# Patient Record
Sex: Female | Born: 1935 | Race: White | Hispanic: No | Smoking: Former smoker
Health system: Southern US, Community
[De-identification: ages and names within clinical notes are randomized; demographics above are authoritative.]

## PROBLEM LIST (undated history)

## (undated) DIAGNOSIS — E7800 Pure hypercholesterolemia, unspecified: Secondary | ICD-10-CM

## (undated) DIAGNOSIS — H9190 Unspecified hearing loss, unspecified ear: Secondary | ICD-10-CM

## (undated) DIAGNOSIS — K5790 Diverticulosis of intestine, part unspecified, without perforation or abscess without bleeding: Secondary | ICD-10-CM

## (undated) DIAGNOSIS — I10 Essential (primary) hypertension: Secondary | ICD-10-CM

## (undated) DIAGNOSIS — K219 Gastro-esophageal reflux disease without esophagitis: Secondary | ICD-10-CM

## (undated) DIAGNOSIS — J302 Other seasonal allergic rhinitis: Secondary | ICD-10-CM

## (undated) DIAGNOSIS — E119 Type 2 diabetes mellitus without complications: Secondary | ICD-10-CM

## (undated) DIAGNOSIS — M1990 Unspecified osteoarthritis, unspecified site: Secondary | ICD-10-CM

## (undated) DIAGNOSIS — C801 Malignant (primary) neoplasm, unspecified: Secondary | ICD-10-CM

## (undated) DIAGNOSIS — R7303 Prediabetes: Secondary | ICD-10-CM

## (undated) DIAGNOSIS — M810 Age-related osteoporosis without current pathological fracture: Secondary | ICD-10-CM

## (undated) DIAGNOSIS — K635 Polyp of colon: Secondary | ICD-10-CM

## (undated) HISTORY — PX: DILATION AND CURETTAGE OF UTERUS: SHX78

---

## 1990-10-08 HISTORY — PX: BREAST CYST ASPIRATION: SHX578

## 1998-10-08 HISTORY — PX: BREAST EXCISIONAL BIOPSY: SUR124

## 2005-03-12 ENCOUNTER — Ambulatory Visit: Payer: Self-pay | Admitting: Internal Medicine

## 2005-08-07 ENCOUNTER — Ambulatory Visit: Payer: Self-pay | Admitting: Unknown Physician Specialty

## 2005-10-28 ENCOUNTER — Other Ambulatory Visit: Payer: Self-pay

## 2005-10-28 ENCOUNTER — Emergency Department: Payer: Self-pay | Admitting: Emergency Medicine

## 2005-10-28 IMAGING — CR DG CHEST 1V PORT
1 series · 1 of 1 positions shown · non-contrast
Comparison: none

REASON FOR EXAM: Chest pain
COMMENTS:

PROCEDURE:     DXR - DXR PORTABLE CHEST SINGLE VIEW  - [DATE]  [DATE]
RESULT:          The lungs are hyperinflated consistent with COPD.  The
heart is borderline enlarged.  There is no infiltrate or effusion.  There
appears to be some apical fibrosis, worse on the RIGHT.

[view not recorded]
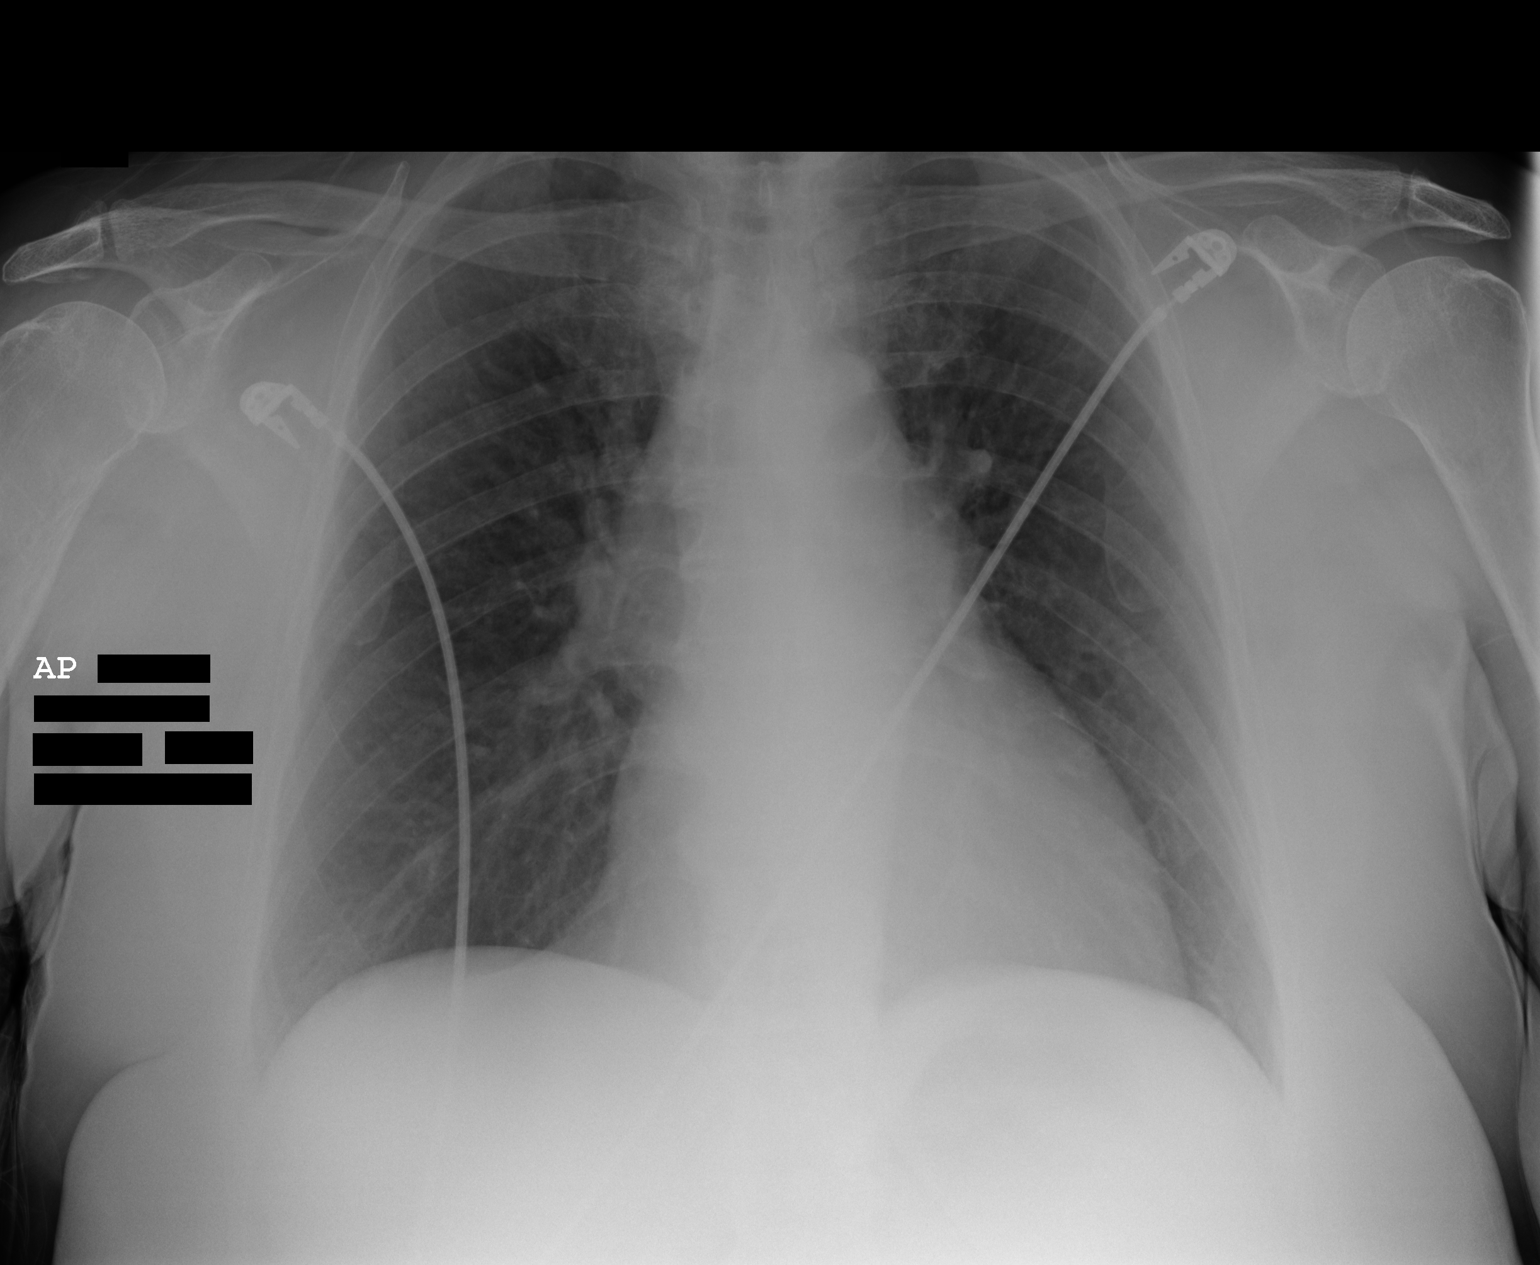

[1 of 1 positions shown; findings below may reference images not displayed]

IMPRESSION: No definite acute abnormality.

## 2006-03-27 ENCOUNTER — Ambulatory Visit: Payer: Self-pay | Admitting: Internal Medicine

## 2007-04-15 ENCOUNTER — Ambulatory Visit: Payer: Self-pay | Admitting: Internal Medicine

## 2008-04-16 ENCOUNTER — Ambulatory Visit: Payer: Self-pay | Admitting: Internal Medicine

## 2009-05-16 ENCOUNTER — Ambulatory Visit: Payer: Self-pay | Admitting: Internal Medicine

## 2009-05-24 ENCOUNTER — Ambulatory Visit: Payer: Self-pay | Admitting: Internal Medicine

## 2009-10-11 ENCOUNTER — Ambulatory Visit: Payer: Self-pay | Admitting: Internal Medicine

## 2009-10-11 IMAGING — US US RENAL KIDNEY
1 series · 17 of 25 positions shown · non-contrast
Comparison: none

REASON FOR EXAM: left side kidney pain
COMMENTS:

[Series 1: us renal kidney · 17 of 33 slices shown]
[im 1/33]
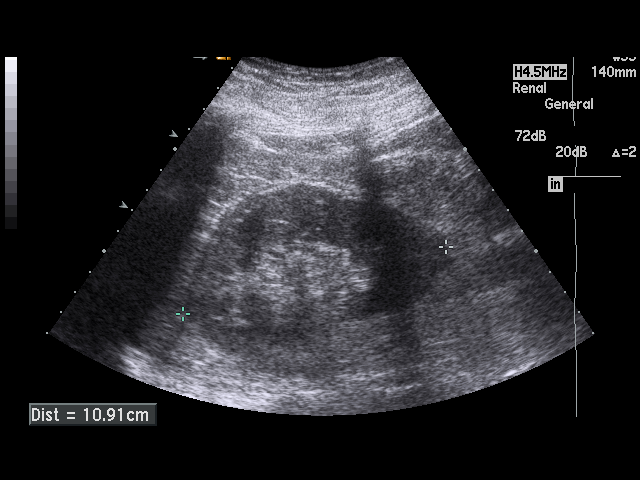
[im 3/33]
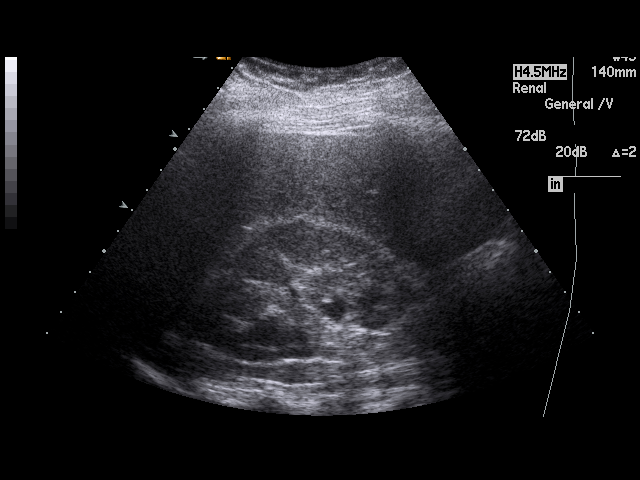
[im 5/33]
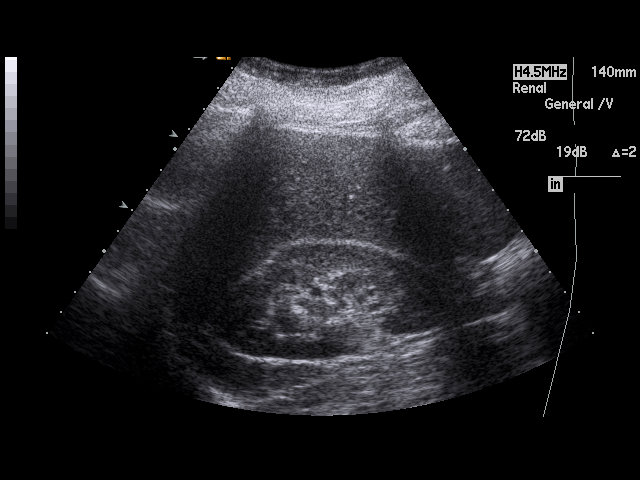
[im 7/33]
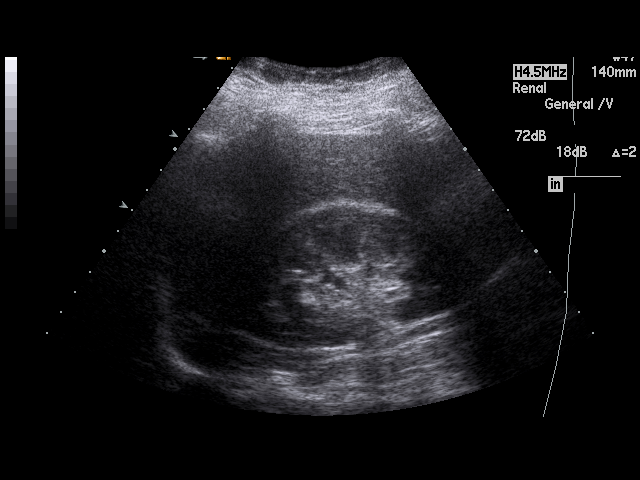
[im 9/33]
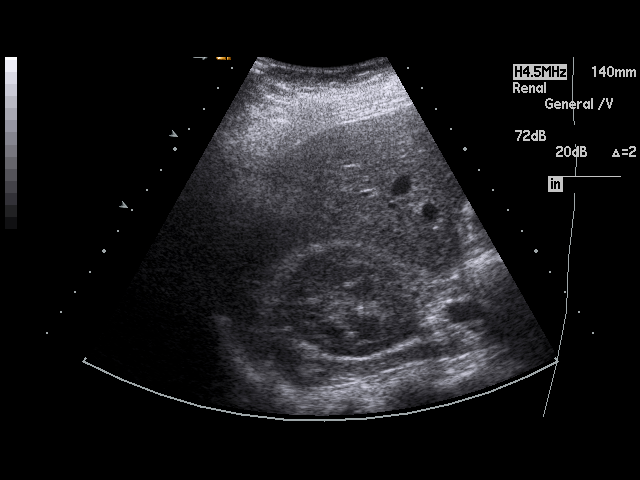
[im 11/33]
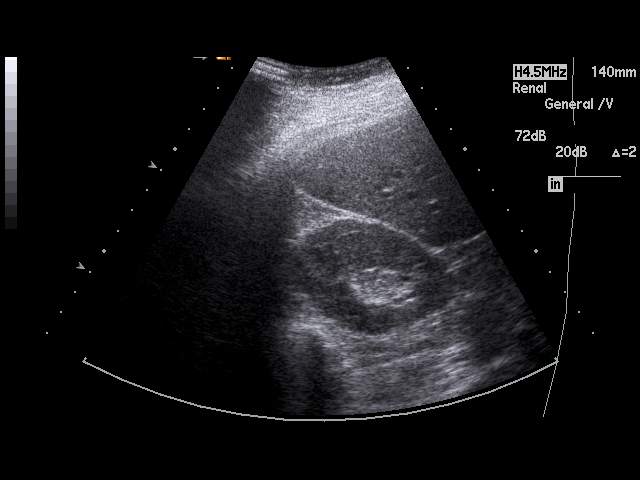
[im 13/33]
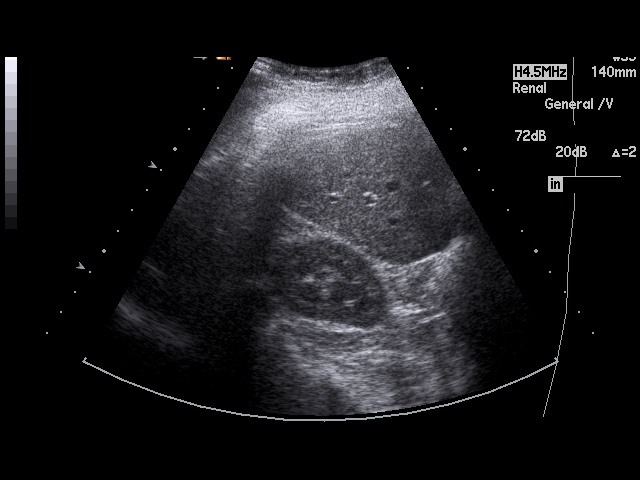
[im 15/33]
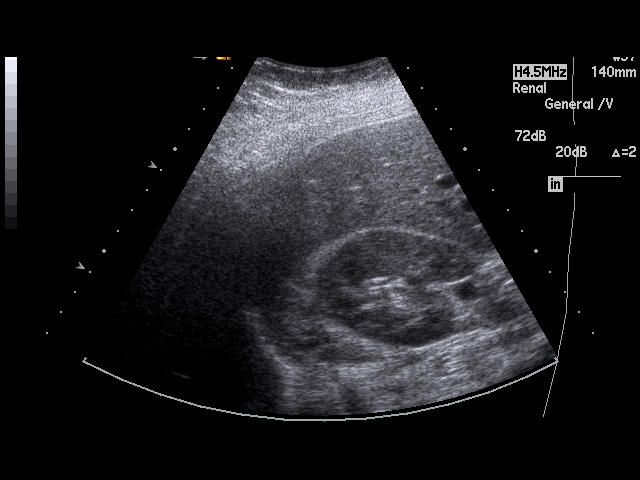
[im 17/33]
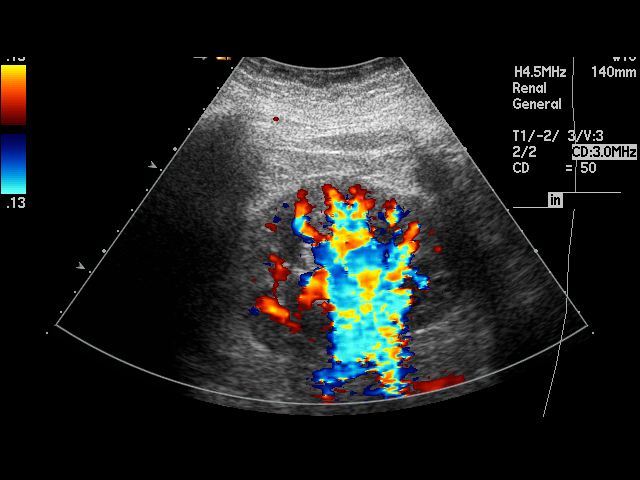
[im 18/33]
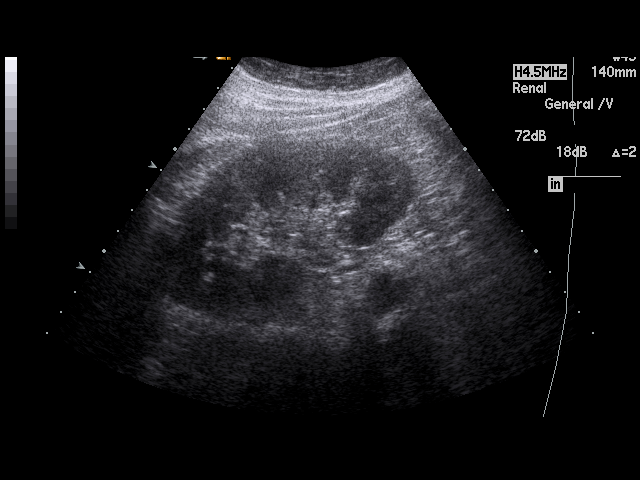
[im 21/33]
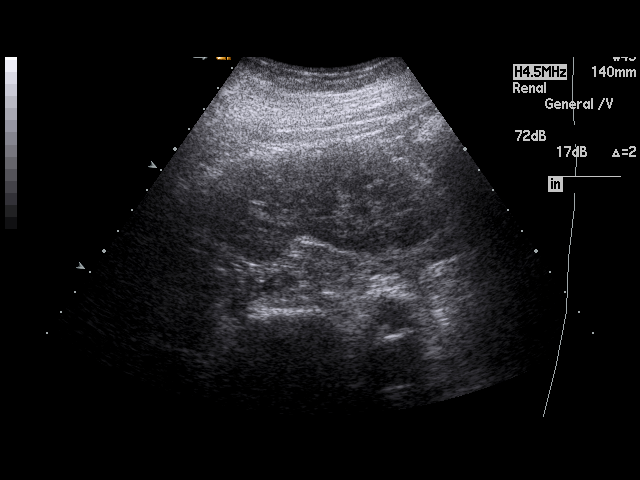
[im 22/33]
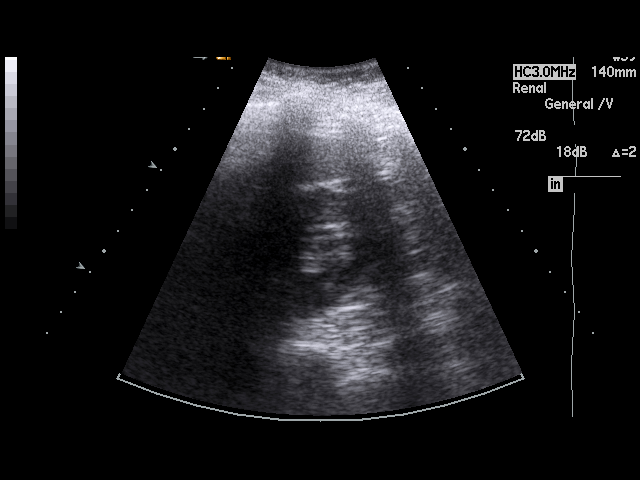
[im 25/33]
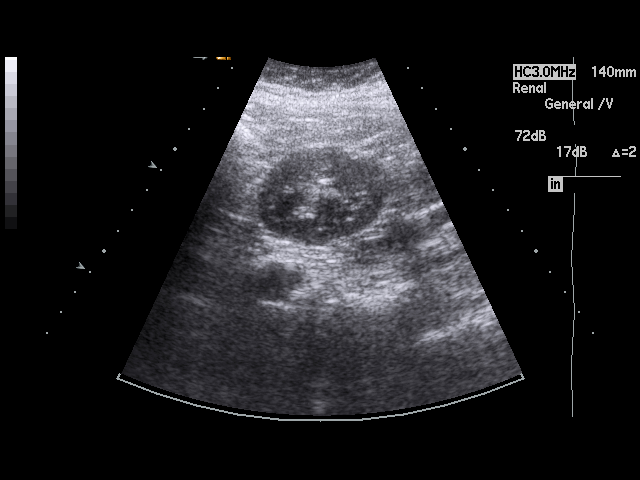
[im 26/33]
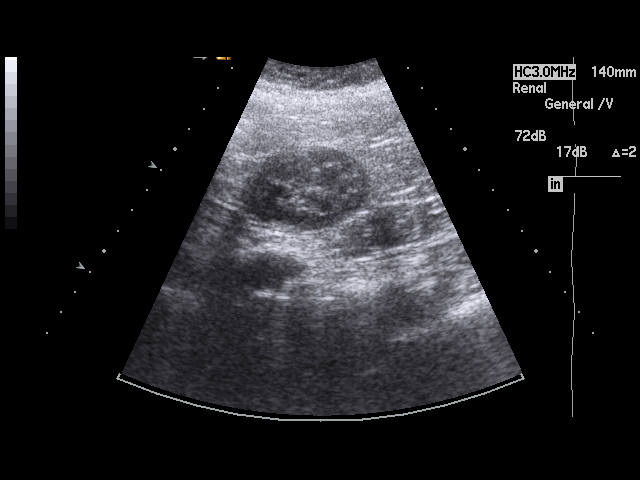
[im 29/33]
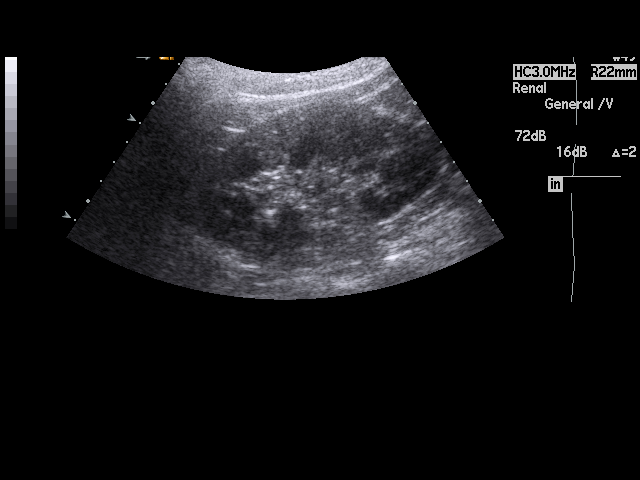
[im 30/33]
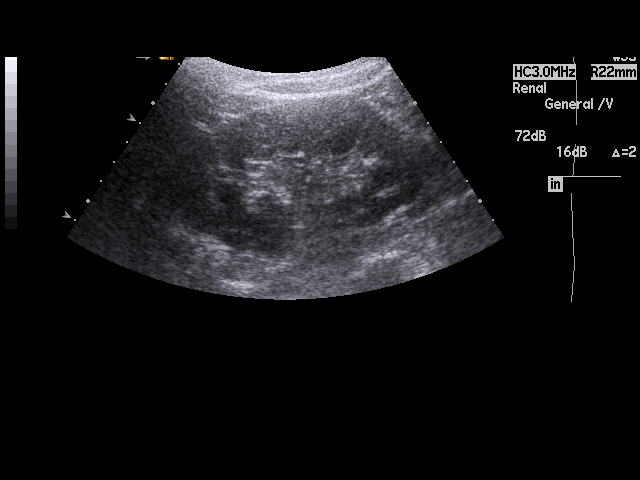
[im 33/33]
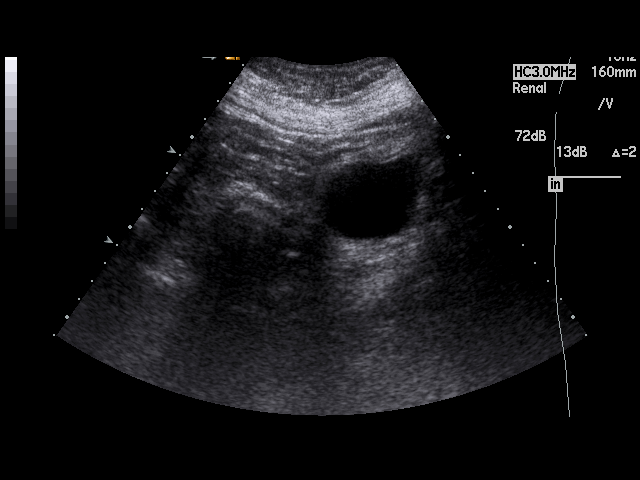

[17 of 25 positions shown; findings below may reference images not displayed]

PROCEDURE:     US  - US KIDNEY  - [DATE]  [DATE]

RESULT:     The right kidney measures 10.9 cm x 5.6 x 5.6 cm. The left
kidney measures 11.5 cm x 5.5 cm x 5.7 cm. The renal cortical margins
bilaterally are smooth. No renal mass lesions are seen. No renal
calcifications are noted. There is no hydronephrosis. The visualized portion
of the urinary bladder is normal in appearance.
IMPRESSION: No significant abnormality noted.

## 2010-06-19 ENCOUNTER — Ambulatory Visit: Payer: Self-pay | Admitting: Internal Medicine

## 2010-06-21 ENCOUNTER — Ambulatory Visit: Payer: Self-pay | Admitting: Internal Medicine

## 2010-06-21 IMAGING — US ULTRASOUND LEFT BREAST
1 series · 16 of 16 positions shown · non-contrast
Comparison: none

REASON FOR EXAM: LT NOD DENSITY
COMMENTS:

[Series 1: ultrasound left breast · 16 of 16 slices shown]
[im 1/16]
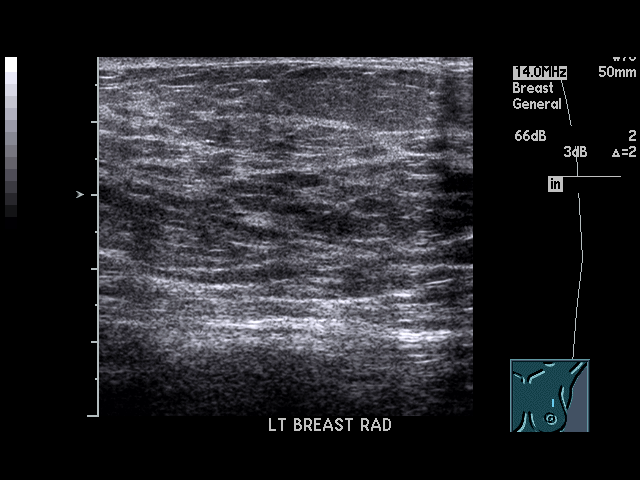
[im 2/16]
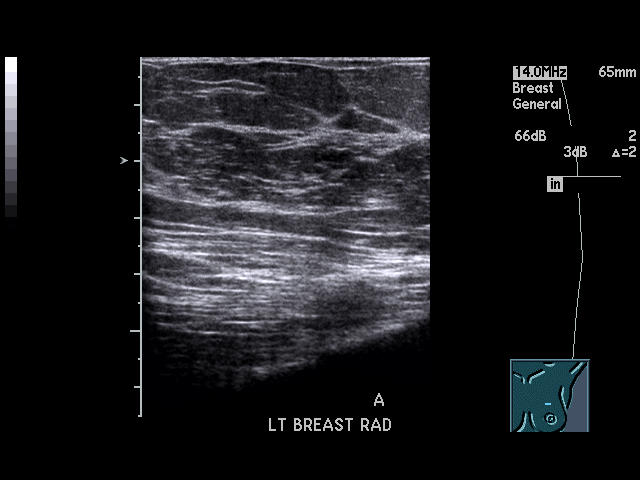
[im 3/16]
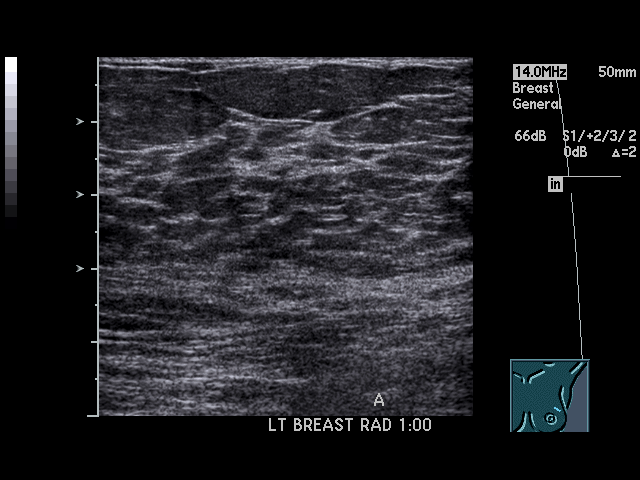
[im 4/16]
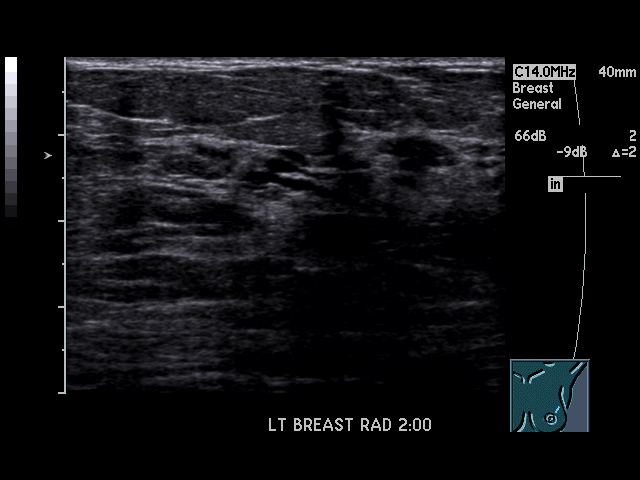
[im 5/16]
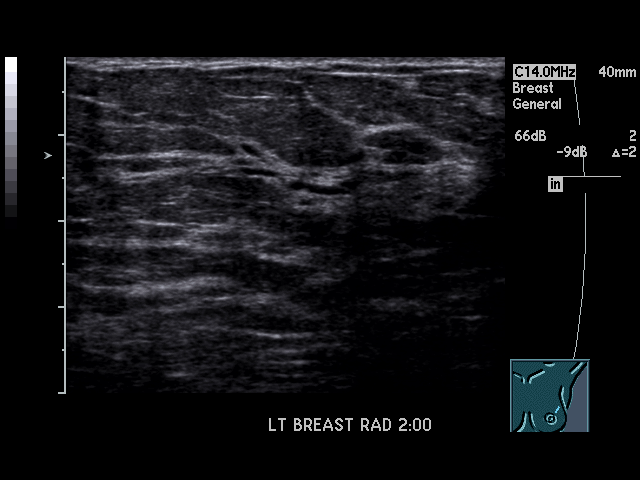
[im 6/16]
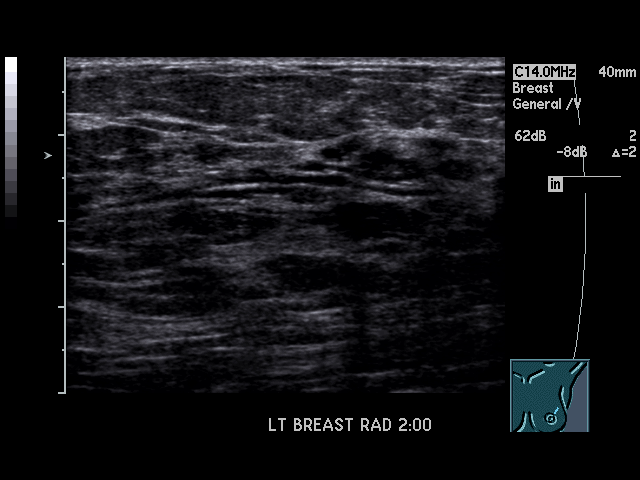
[im 7/16]
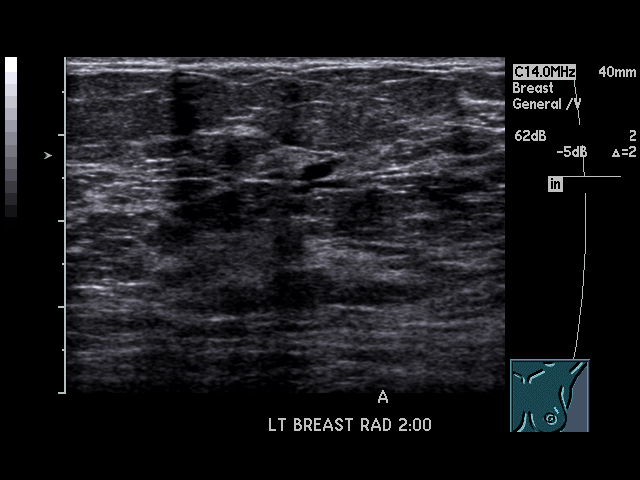
[im 8/16]
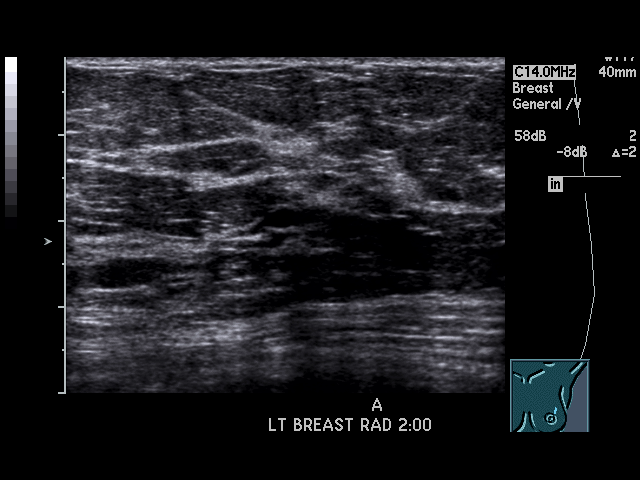
[im 9/16]
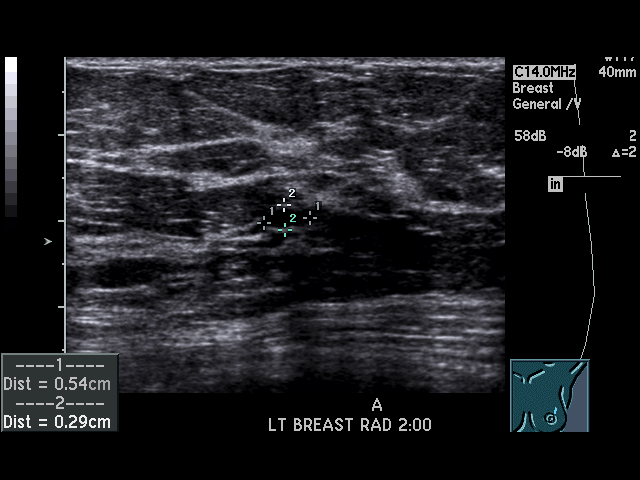
[im 10/16]
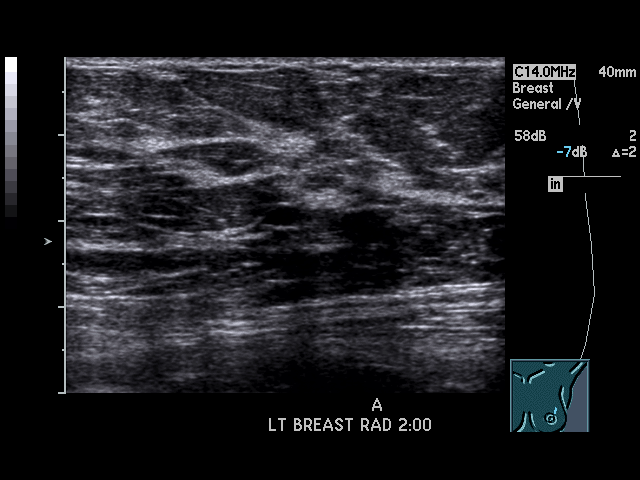
[im 11/16]
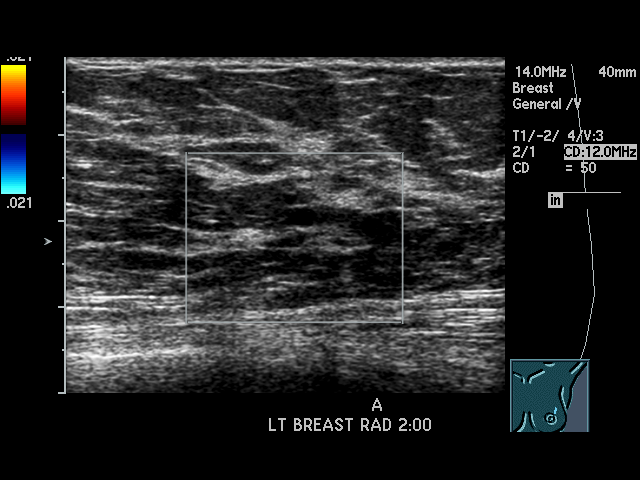
[im 12/16]
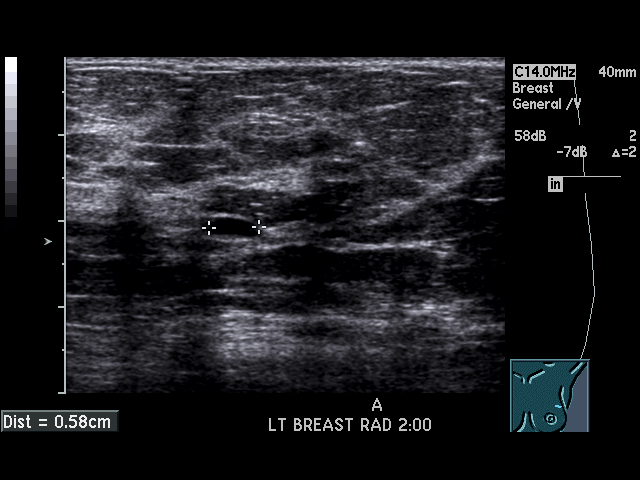
[im 13/16]
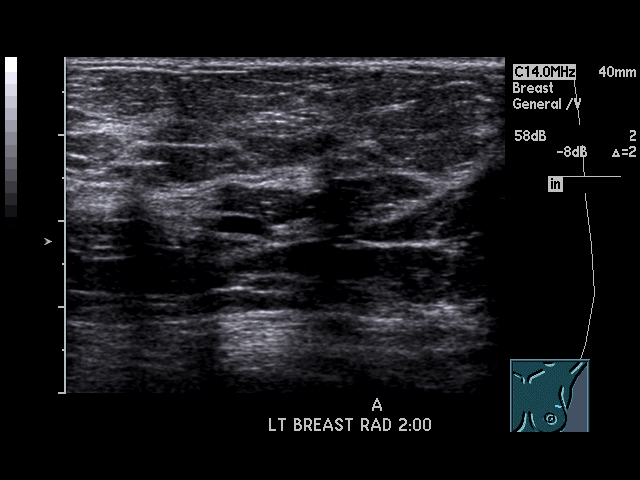
[im 14/16]
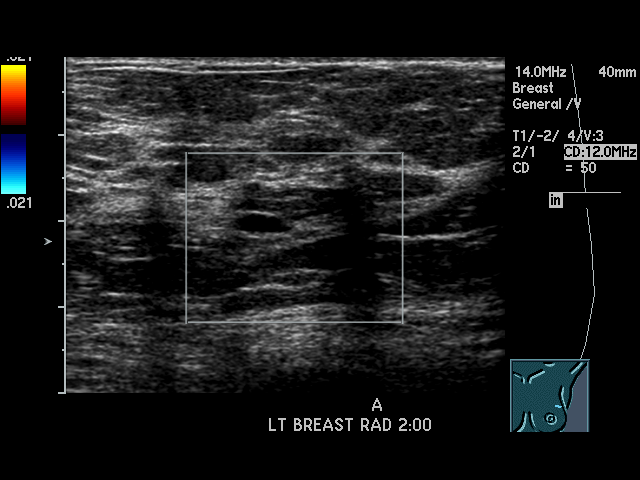
[im 15/16]
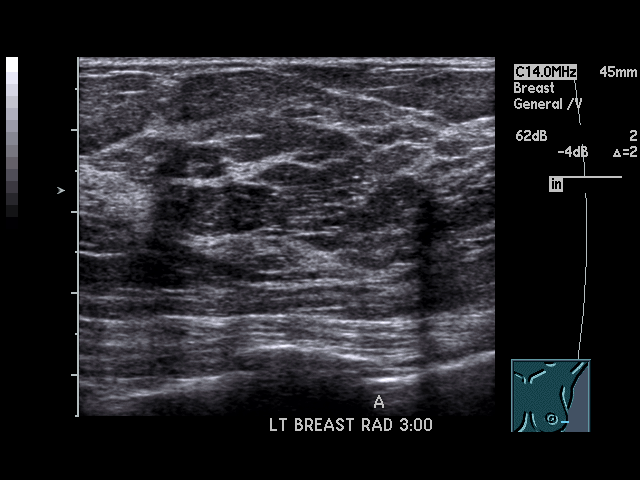
[im 16/16]
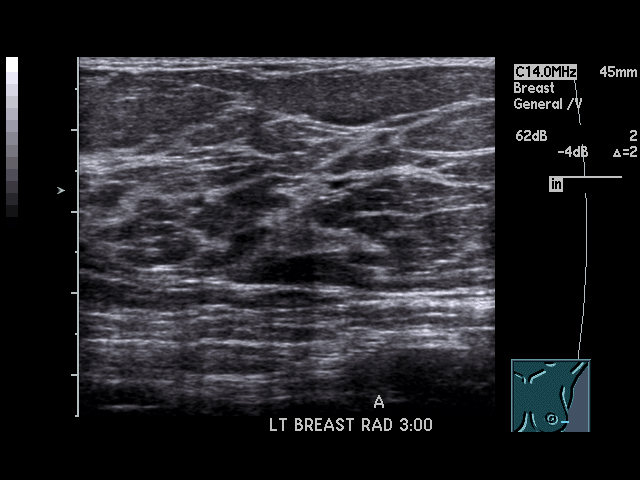

[16 of 16 positions shown; findings below may reference images not displayed]

PROCEDURE:     US  - US BREAST LEFT  - [DATE] [DATE]

RESULT:     Targeted ultrasound in the upper outer left breast demonstrates
an area of dilated ducts in the 2 o'clock region with an associated or
adjacent 0.5 x 0.58 x 0.29 cm smoothly marginated cyst.  No solid or
shadowing parenchymal elements are seen in this region. This may well be a
benign process but given the slowly progressive mammographic density in this
region, surgical consultation is suggested.
IMPRESSION: BI-RADS: Category 4- A Suspicious Abnormality

## 2010-10-16 ENCOUNTER — Ambulatory Visit: Payer: Self-pay | Admitting: Surgery

## 2010-10-16 IMAGING — US ULTRASOUND LEFT BREAST
1 series · 10 of 10 positions shown · non-contrast
Comparison: none

REASON FOR EXAM: 3 MONTH FU LT NODULAR DENSITY
COMMENTS:

[Series 1: ultrasound left breast · 10 of 10 slices shown]
[im 1/10]
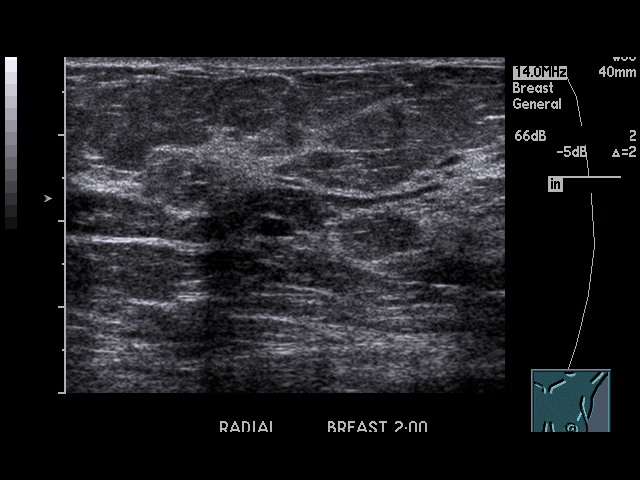
[im 2/10]
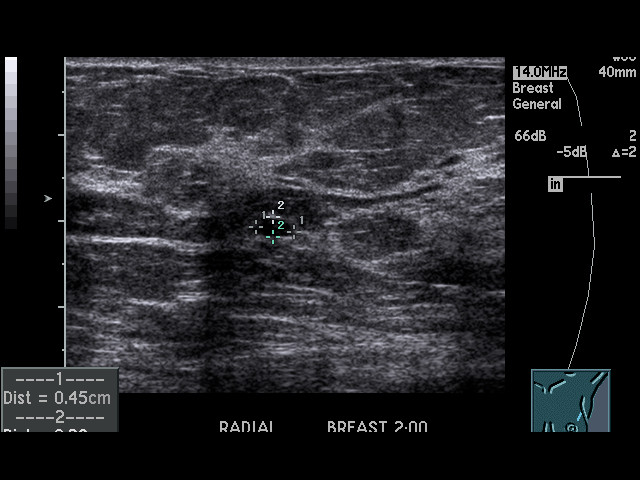
[im 3/10]
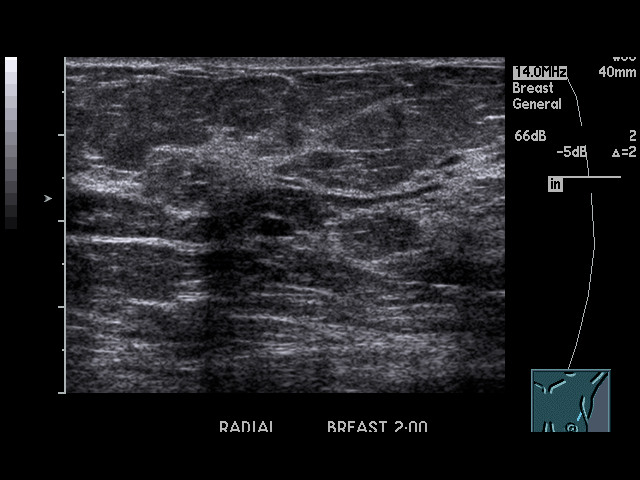
[im 4/10]
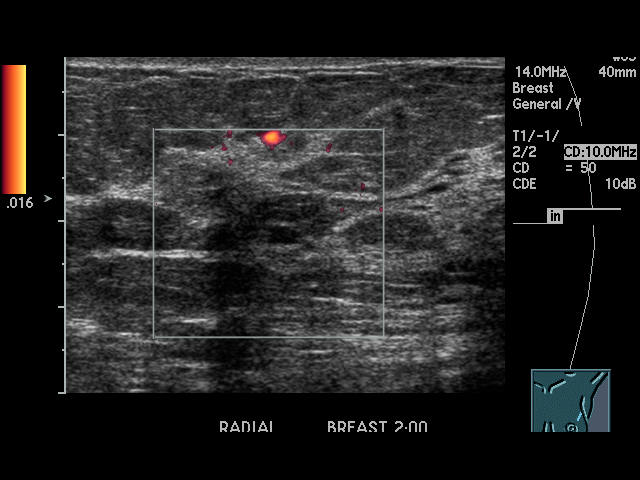
[im 5/10]
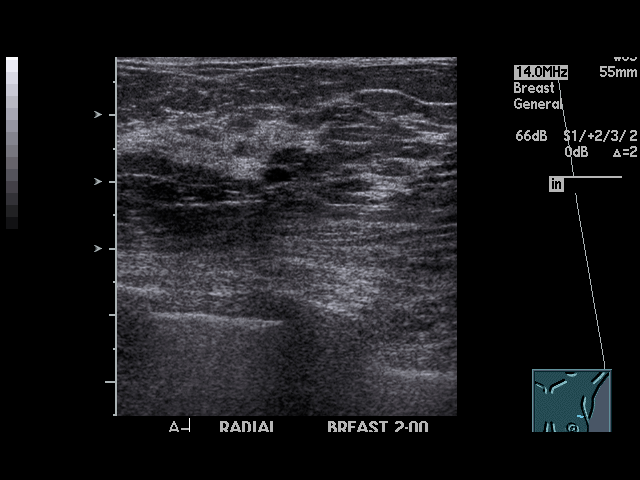
[im 6/10]
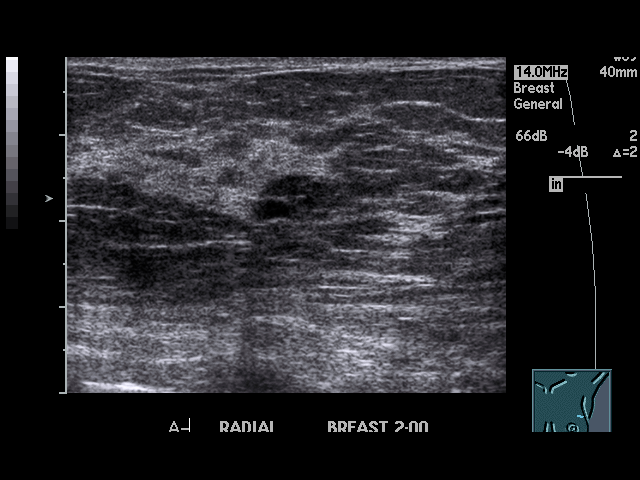
[im 7/10]
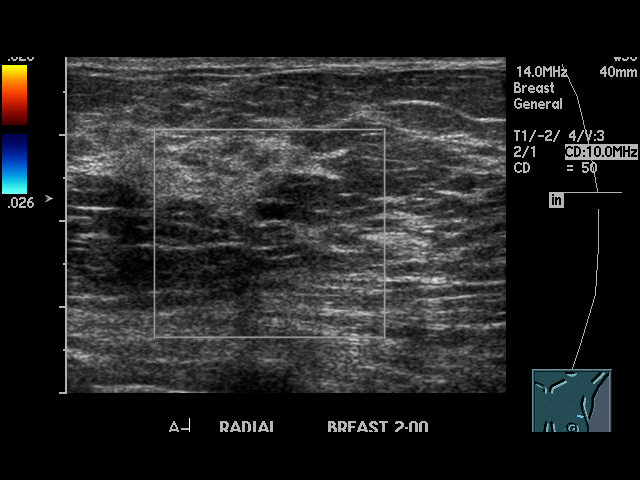
[im 8/10]
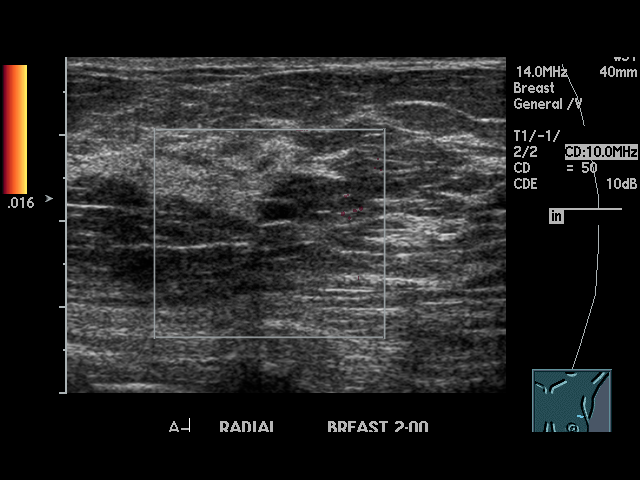
[im 9/10]
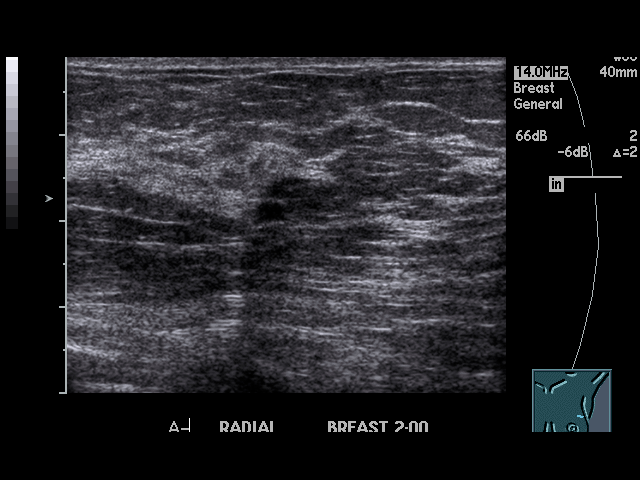
[im 10/10]
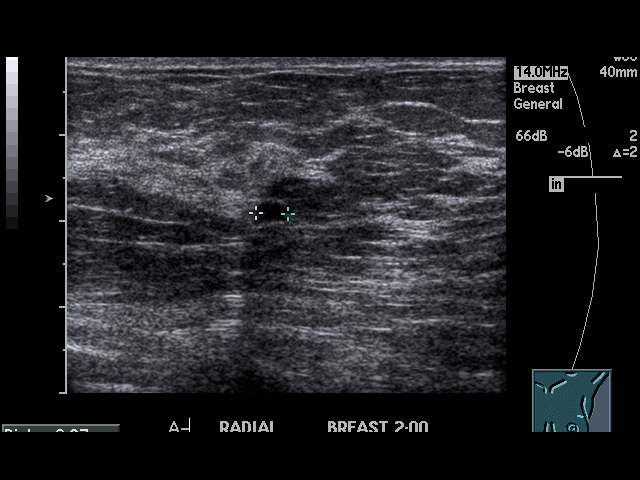

[10 of 10 positions shown; findings below may reference images not displayed]

PROCEDURE:     US  - US BREAST LEFT  - [DATE] [DATE]

RESULT:     Ultrasound of the left breast upper outer mid to posterior
portion demonstrates a hypoechoic area in the 2 o'clock region measuring
0.45 x 0.23 x 0.37 cm. The dilated ducts demonstrated previously are not
definitely demonstrated to the same degree on today's exam. No shadowing or
malignant characteristics are appreciated.
IMPRESSION: No definite malignant features. Please see the separate
unilateral left breast mammogram dictation.

## 2010-12-08 ENCOUNTER — Ambulatory Visit: Payer: Self-pay | Admitting: Unknown Physician Specialty

## 2011-04-19 ENCOUNTER — Ambulatory Visit: Payer: Self-pay | Admitting: Internal Medicine

## 2011-11-09 ENCOUNTER — Ambulatory Visit: Payer: Self-pay | Admitting: Family Medicine

## 2011-11-09 IMAGING — US US PELV - US TRANSVAGINAL
1 series · 17 of 25 positions shown · non-contrast
Comparison: none

REASON FOR EXAM: post menopausal vaginal bleeding
COMMENTS:

[Series 1: us pelv - us transvaginal · 17 of 86 slices shown]
[im 1/86]
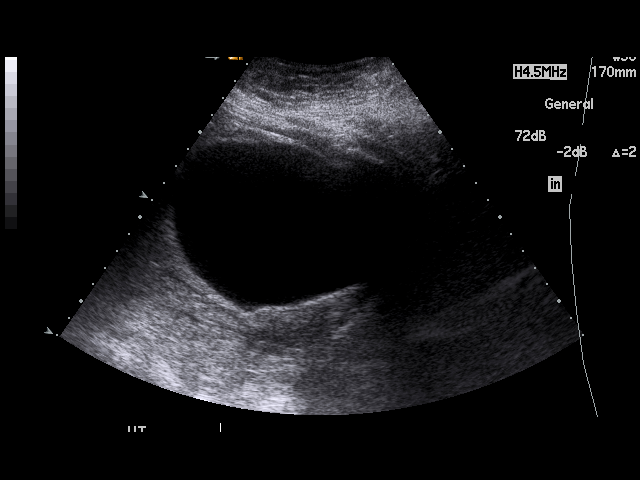
[im 8/86]
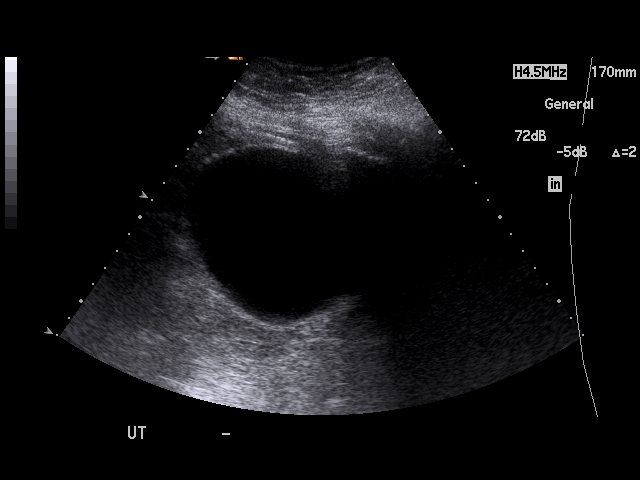
[im 11/86]
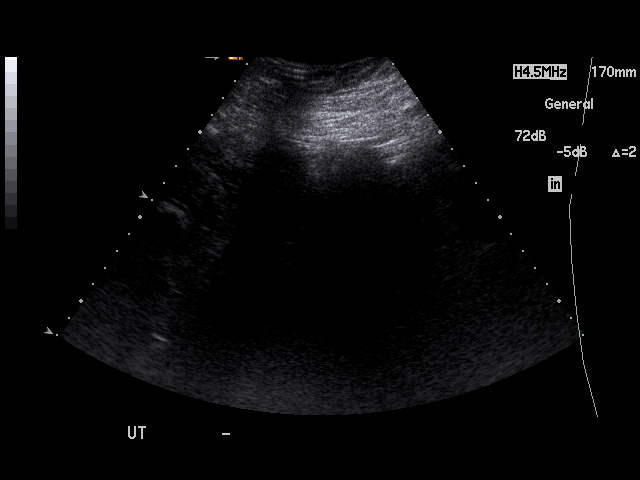
[im 18/86]
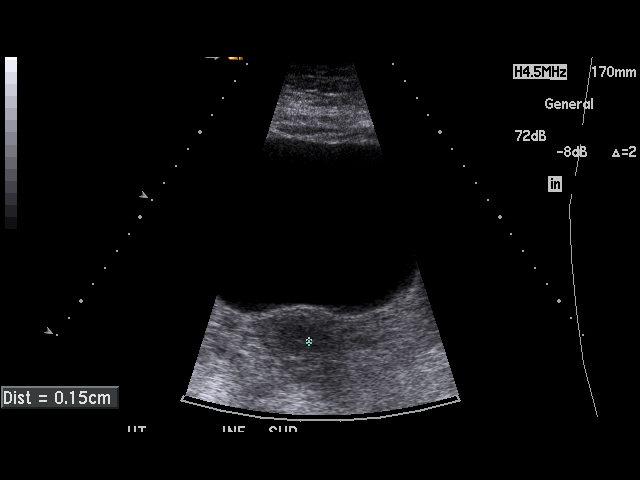
[im 22/86]
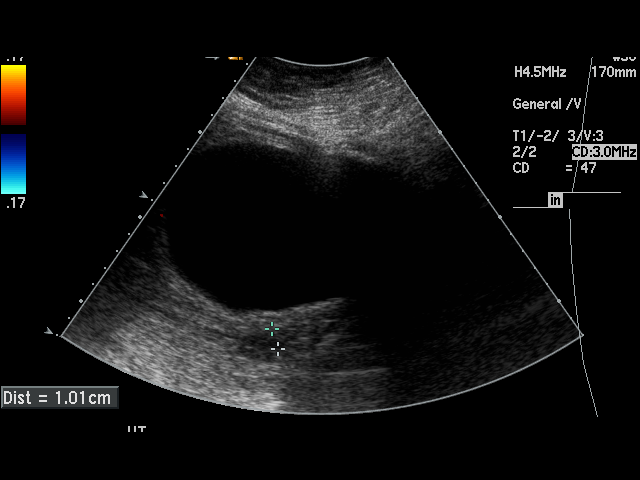
[im 29/86]
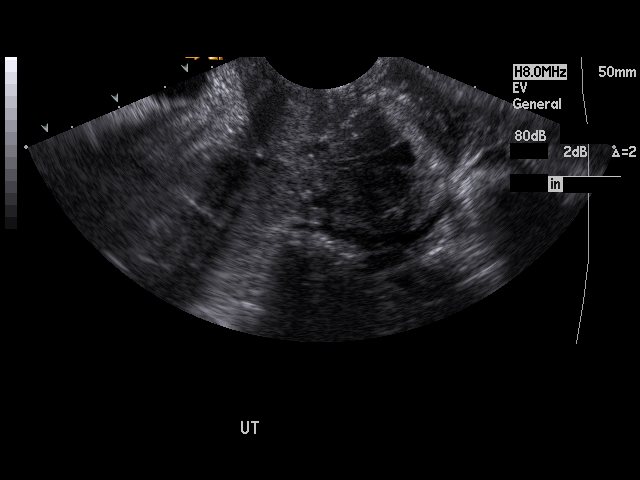
[im 32/86]
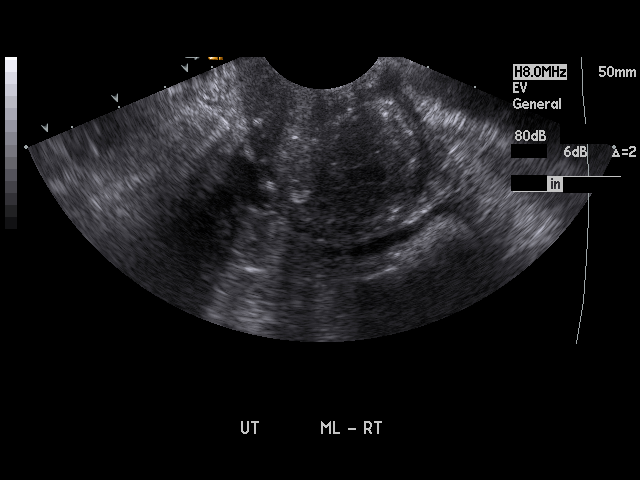
[im 39/86]
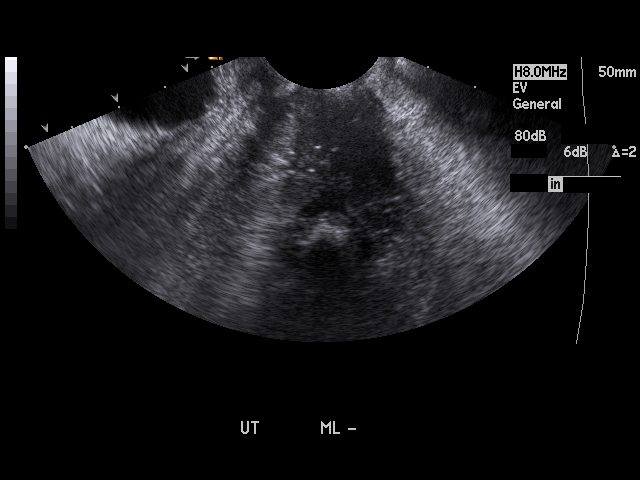
[im 43/86]
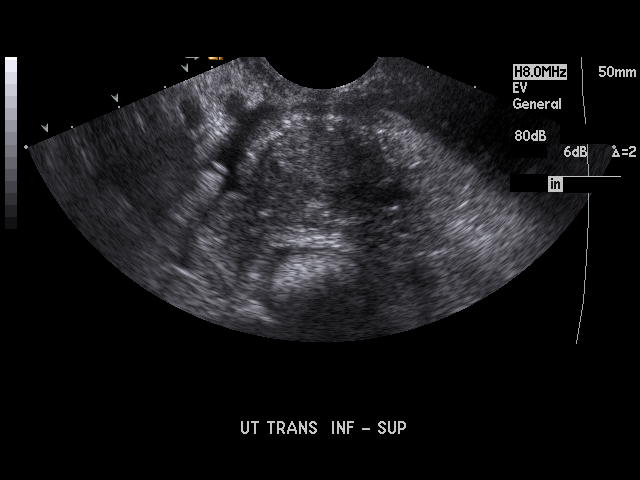
[im 47/86]
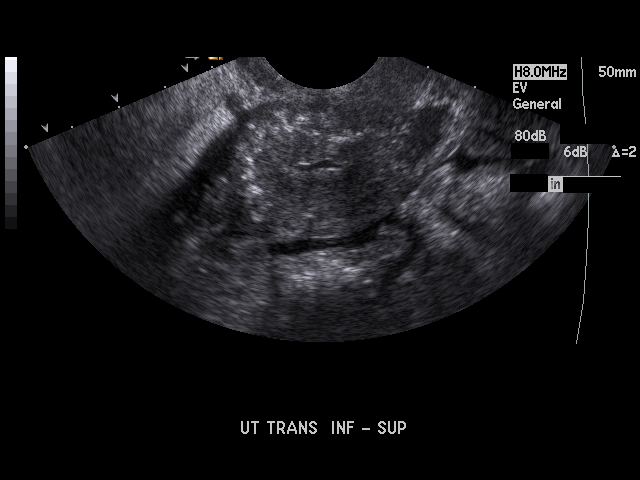
[im 54/86]
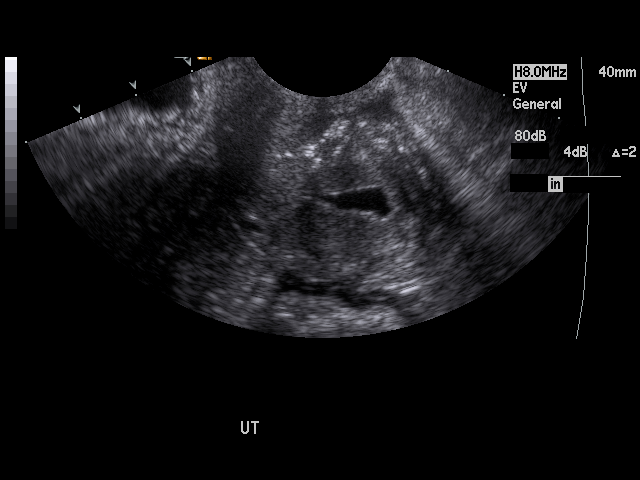
[im 57/86]
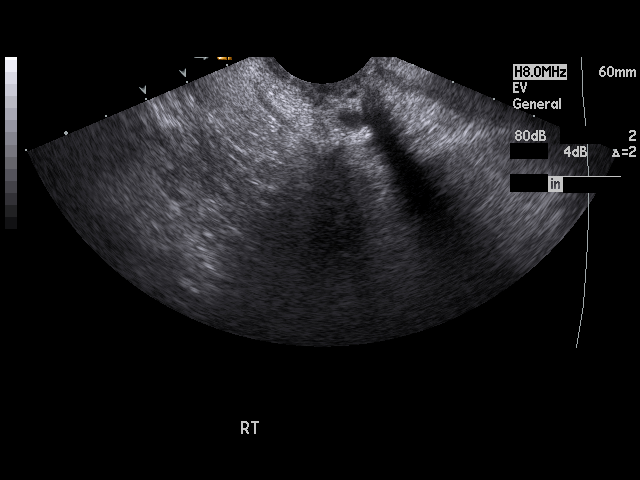
[im 64/86]
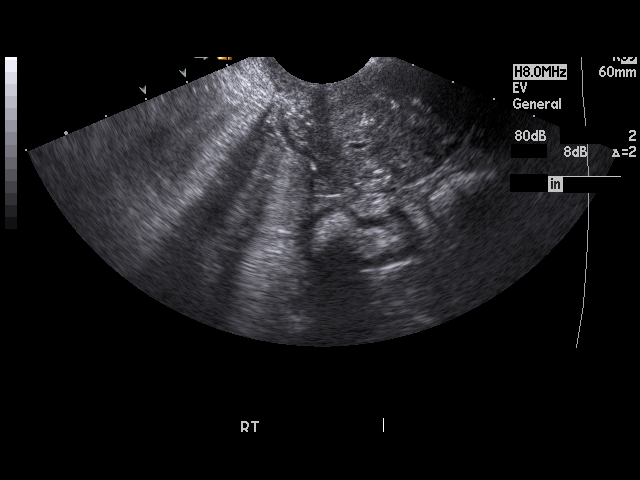
[im 68/86]
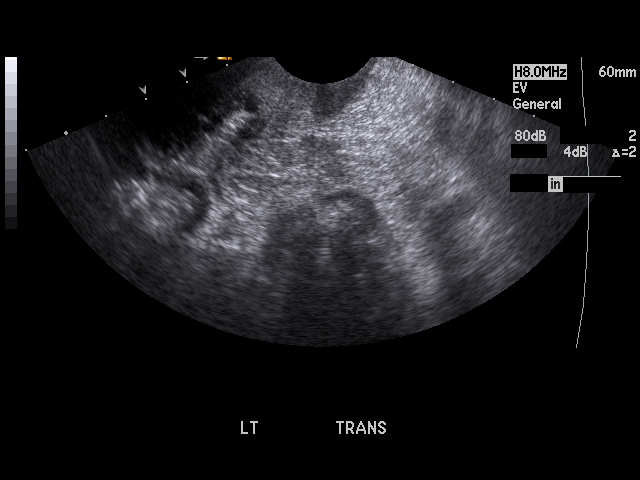
[im 75/86]
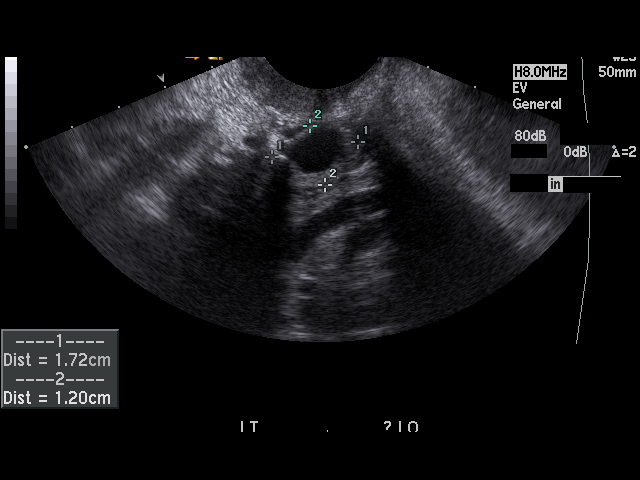
[im 78/86]
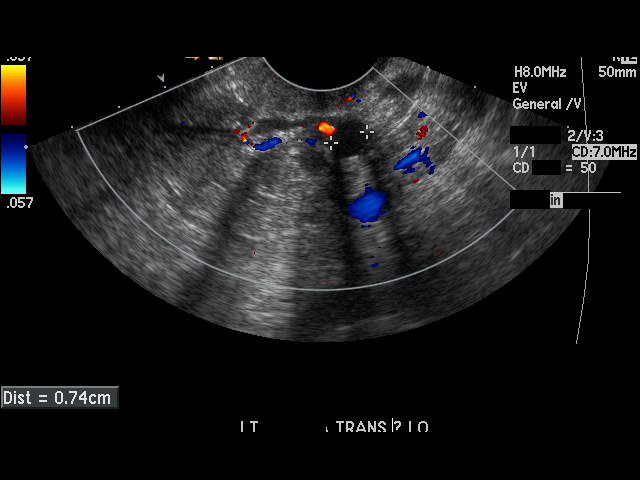
[im 86/86]
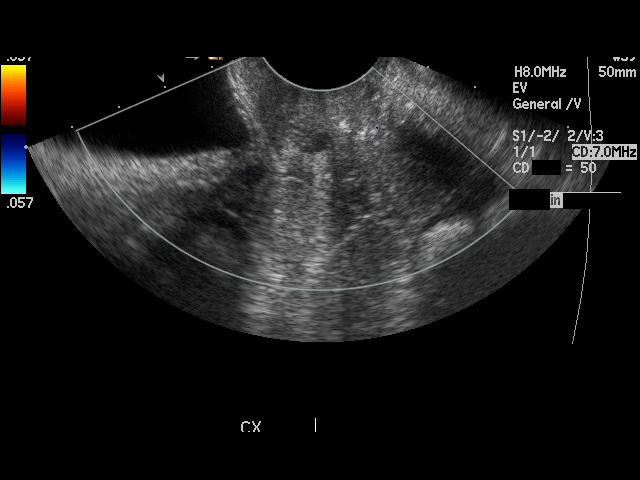

[17 of 25 positions shown; findings below may reference images not displayed]

PROCEDURE:     US  - US PELVIS EXAM W/TRANSVAGINAL  - [DATE]  [DATE]

RESULT:     Ultrasound of the pelvis is performed with transabdominal and
endovaginal scanning. The uterus measures 5.91 x 2.35 x 3.77 cm. There is a
small amount of cul-de-sac free fluid. The kidneys appear grossly normal.
The uterus has a mixed echotexture in the myometrium with some fluid in the
endometrial cavity. Maximal anterior to posterior dimension is 3.5 mm of
fluid. The right ovary is not seen. The left ovary measures 1.47 x 1.20 x
1.72 cm and contains a simple appearing 0.91 x 0.71 x 0.74 cm cyst. The
heterogeneous echotexture of the myometrium appears to be secondary to areas
of increased echogenicity which may represent calcifications.
IMPRESSION: 1. Nonvisualization of the right ovary.
2. Small left ovarian cyst.
3. Endometrial cavity fluid. Atrophy of the uterus with some myometrial
calcification and a retroverted uterine appearance.

## 2011-12-12 ENCOUNTER — Ambulatory Visit: Payer: Self-pay | Admitting: Obstetrics and Gynecology

## 2011-12-12 LAB — BASIC METABOLIC PANEL
Anion Gap: 8 (ref 7–16)
BUN: 26 mg/dL — ABNORMAL HIGH (ref 7–18)
Calcium, Total: 9 mg/dL (ref 8.5–10.1)
Chloride: 104 mmol/L (ref 98–107)
Co2: 30 mmol/L (ref 21–32)
Creatinine: 0.7 mg/dL (ref 0.60–1.30)
Osmolality: 288 (ref 275–301)

## 2011-12-12 LAB — CBC
HGB: 11.7 g/dL — ABNORMAL LOW (ref 12.0–16.0)
MCH: 30.2 pg (ref 26.0–34.0)
MCHC: 33.5 g/dL (ref 32.0–36.0)
Platelet: 169 10*3/uL (ref 150–440)
RBC: 3.87 10*6/uL (ref 3.80–5.20)
RDW: 13.5 % (ref 11.5–14.5)

## 2011-12-17 ENCOUNTER — Ambulatory Visit: Payer: Self-pay | Admitting: Obstetrics and Gynecology

## 2011-12-20 LAB — PATHOLOGY REPORT

## 2012-04-21 ENCOUNTER — Ambulatory Visit: Payer: Self-pay | Admitting: Family Medicine

## 2012-04-21 IMAGING — MG MM CAD SCREENING MAMMO
1 series · 4 of 4 positions shown · non-contrast
Comparison: none

REASON FOR EXAM: SCR MAMMO NO ORDER
COMMENTS:

PROCEDURE:     MAM - MAM DGTL SCRN MAM NO ORDER W/CAD  - [DATE]  [DATE]
RESULT:
Comparisons: [DATE] and [DATE].

[R CC · right · 4 of 4 slices shown]
[im 1/4]
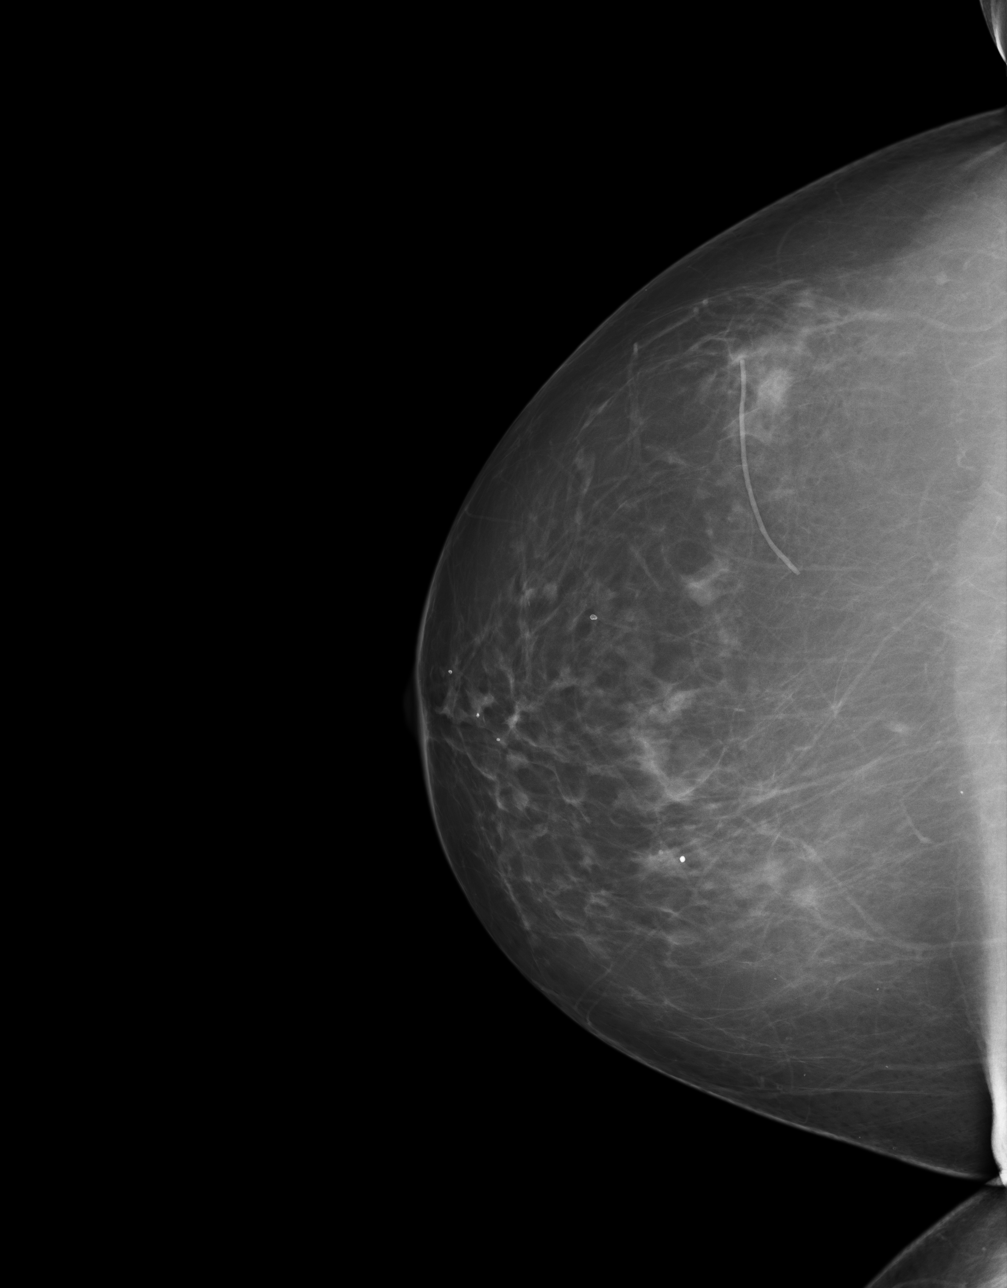
[im 2/4]
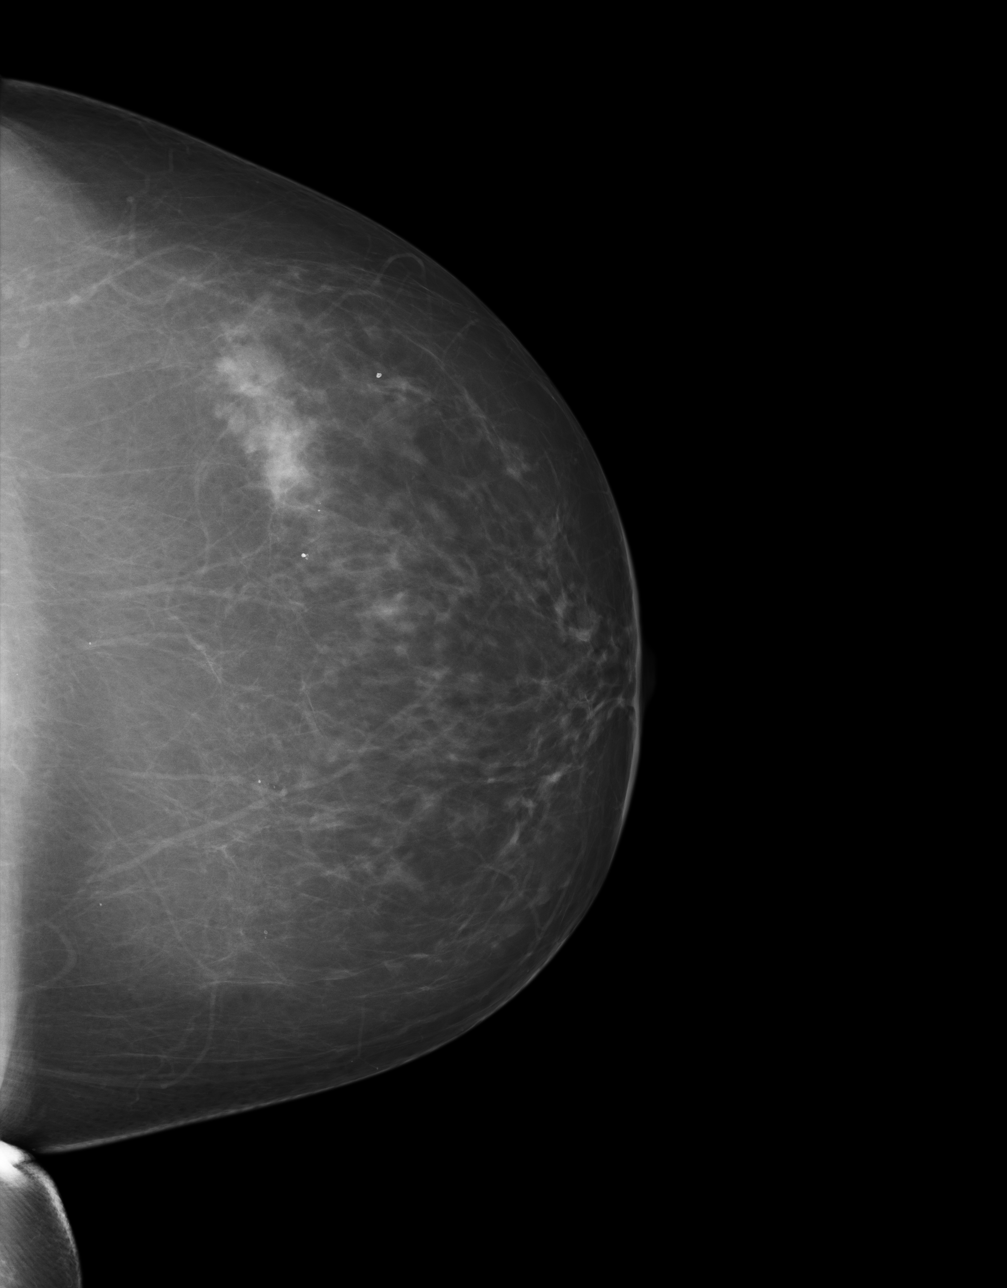
[im 3/4]
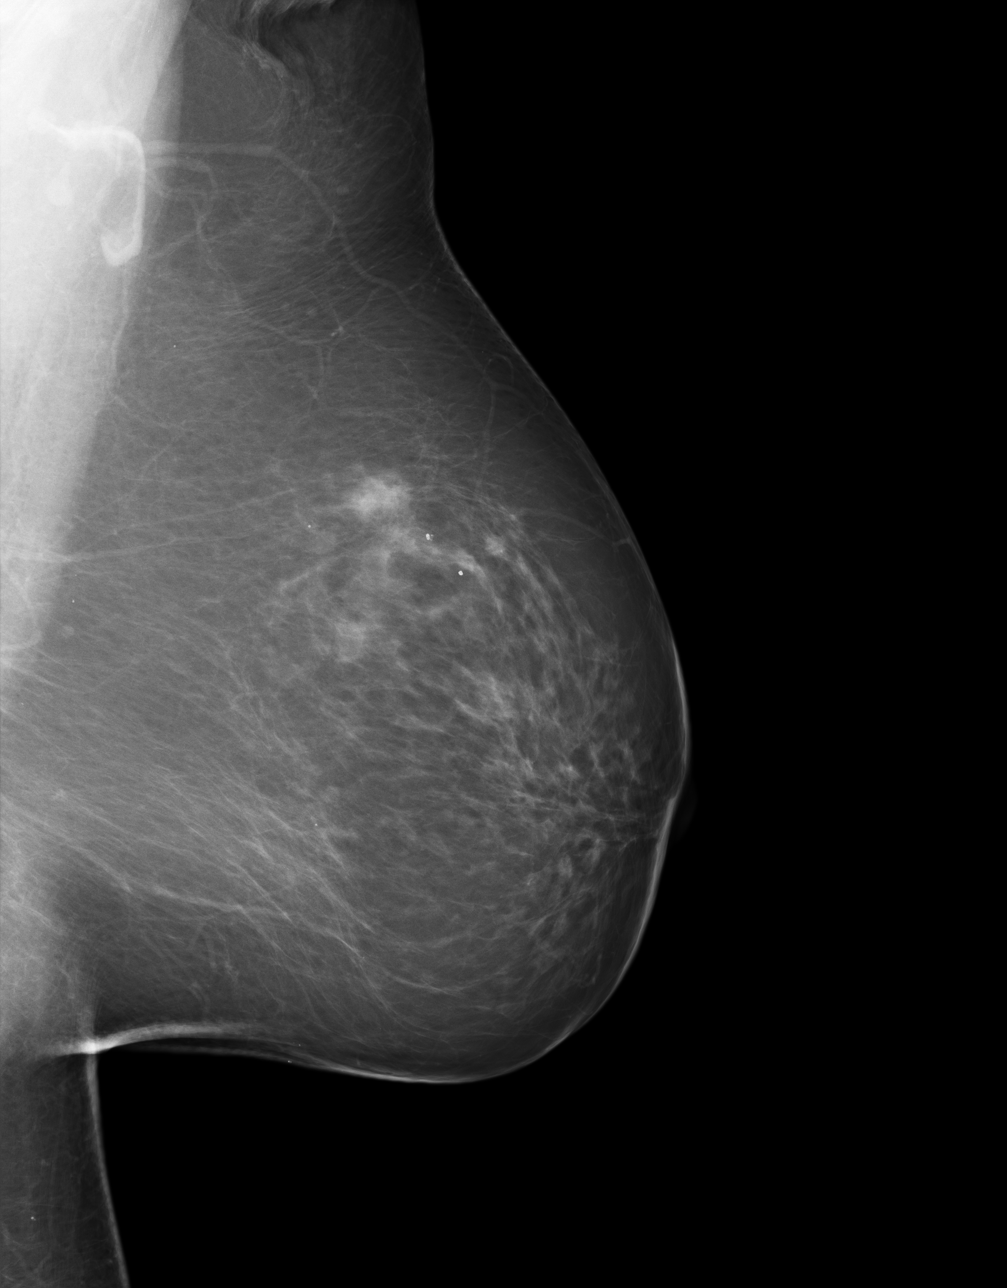
[im 4/4]
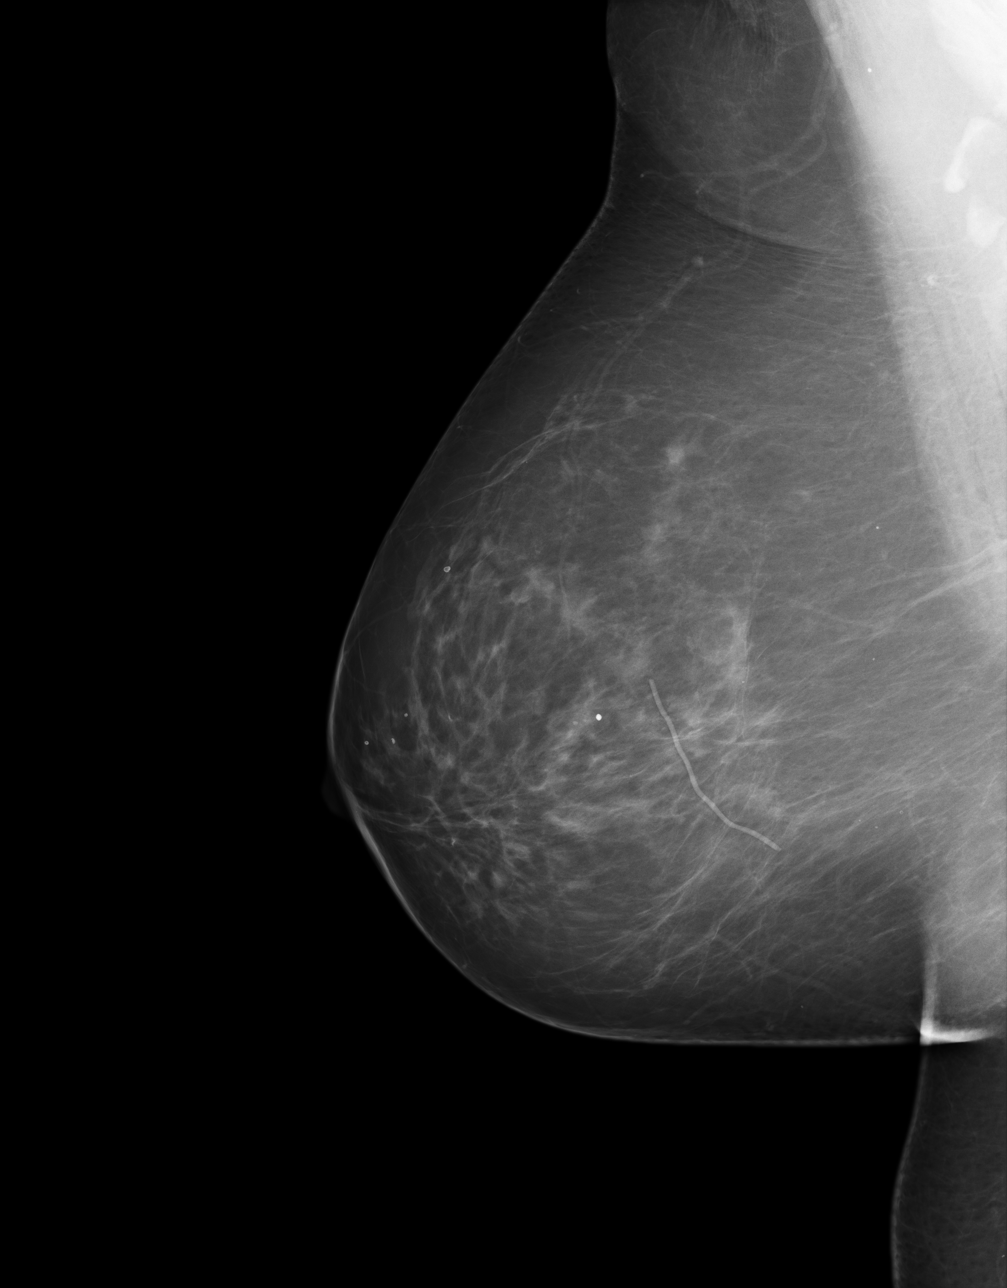

[4 of 4 positions shown; findings below may reference images not displayed]

FINDINGS: There is scattered fibroglandular tissue. Small asymmetry in the lateral
aspect of the left CC view as well as small asymmetry in the superior aspect
of the left MLO view are similar to prior studies including [DATE] and
[DATE]. No suspicious masses or calcifications are defined. No areas of
architectural distortion.
IMPRESSION: BI-RADS: Category 2 - Benign Finding.

Recommend continued annual screening mammography.

A NEGATIVE MAMMOGRAM REPORT DOES NOT PRECLUDE BIOPSY OR OTHER EVALUATION OF
A CLINICALLY PALPABLE OR OTHERWISE SUSPICIOUS MASS OR LESION. BREAST CANCER
MAY NOT BE DETECTED BY MAMMOGRAPHY IN UP TO 10% OF CASES.

## 2013-04-22 ENCOUNTER — Ambulatory Visit: Payer: Self-pay | Admitting: Family Medicine

## 2013-04-22 IMAGING — MG MM CAD SCREENING MAMMO
1 series · 5 of 5 positions shown · non-contrast
Comparison: none

REASON FOR EXAM: SCR MAMMO NO ORDER
COMMENTS:

PROCEDURE:     MAM - MAM DGTL SCRN MAM NO ORDER W/CAD  - [DATE]  [DATE]
RESULT:     Comparison made to prior study dated [DATE]. No mass. Nodular
parenchymal pattern. Benign calcifications. CAD evaluation is nonfocal.

[R CC · right · 5 of 5 slices shown]
[im 1/5]
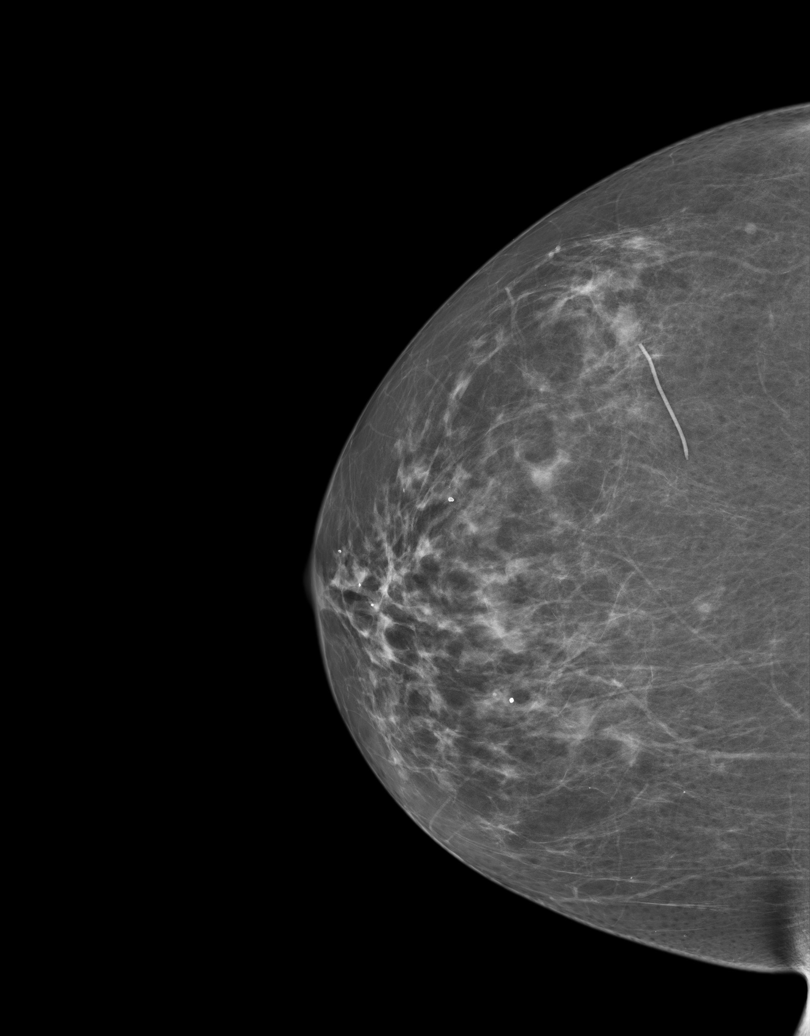
[im 2/5]
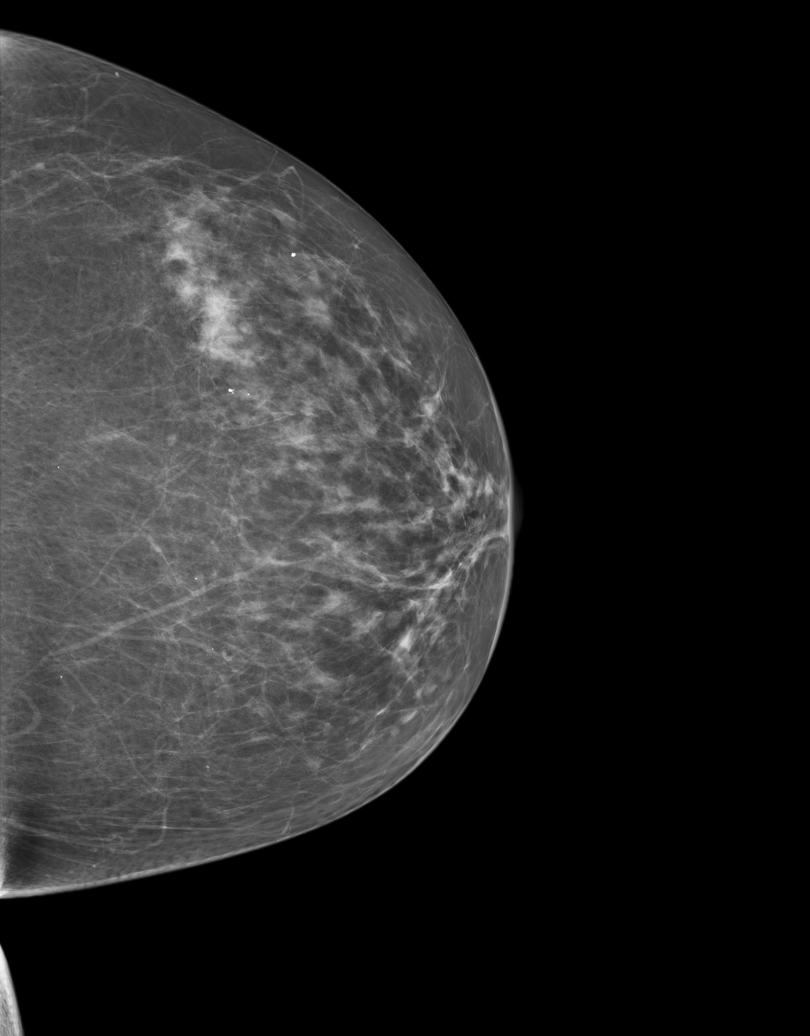
[im 3/5]
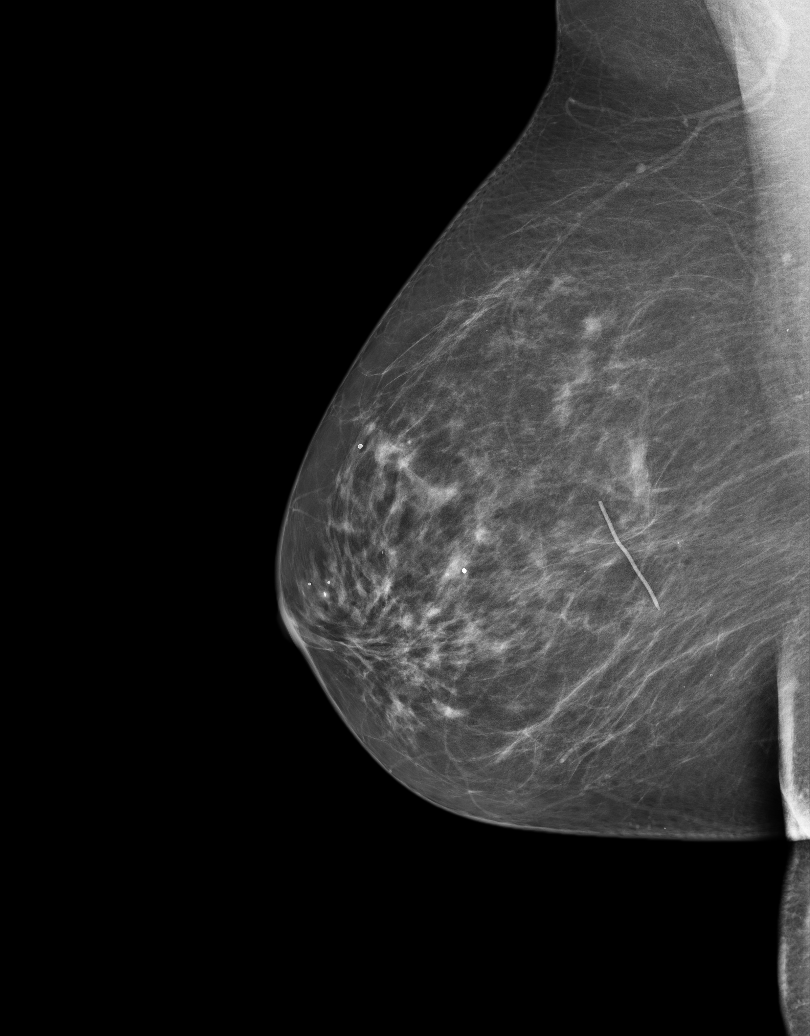
[im 4/5]
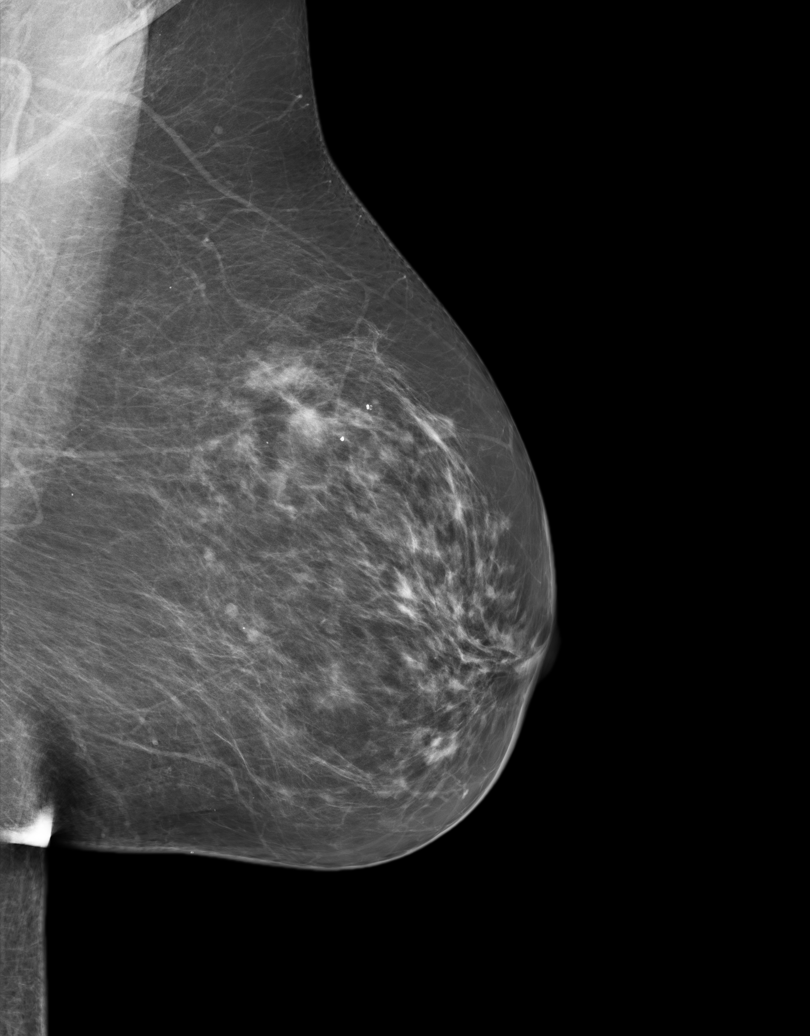
[im 5/5]
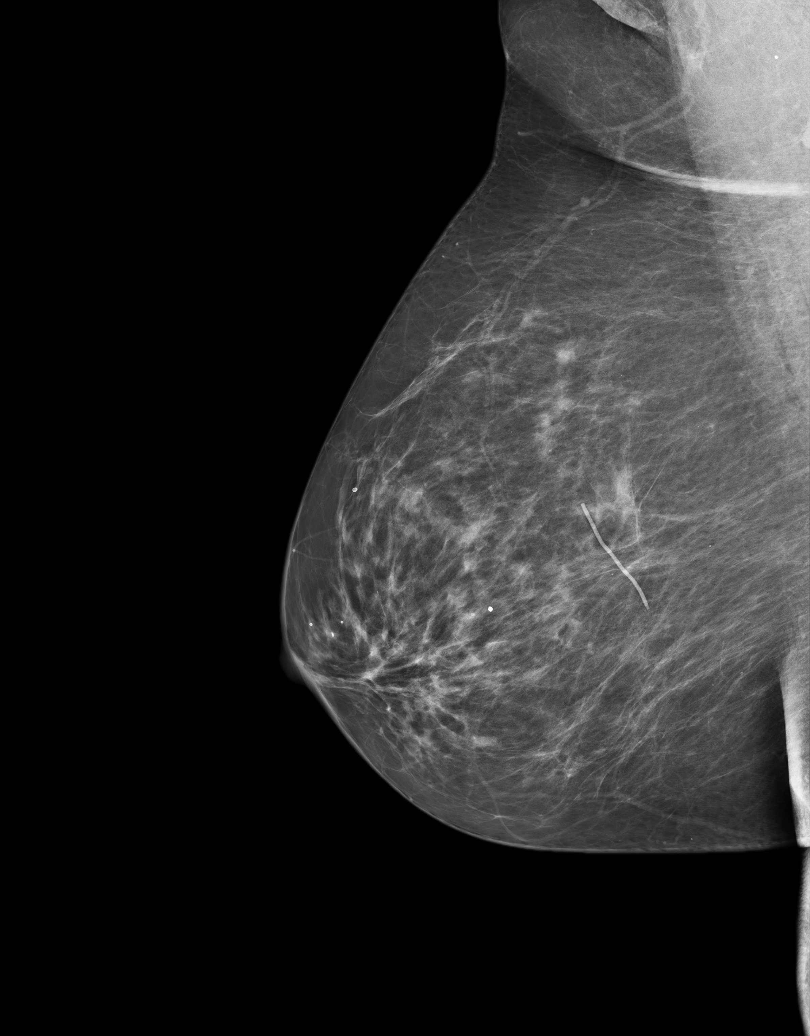

[5 of 5 positions shown; findings below may reference images not displayed]

IMPRESSION: Benign exam. Yearly followup exam suggested.

BI-RADS: Category 2- Benign Finding

A NEGATIVE MAMMOGRAM REPORT DOES NOT PRECLUDE BIOPSY OR OTHER EVALUATION OF
A CLINICALLY PALPABLE OR OTHERWISE SUSPICIOUS MASS OR LESION. BREAST CANCER
MAY NOT BE DETECTED IN UP TO 10% OF CASES.

BREAST COMPOSITION: The breast composition is SCATTERED FIBROGLANDULAR
TISSUE (glandular tissue is 25-50%)

## 2014-04-23 ENCOUNTER — Ambulatory Visit: Payer: Self-pay | Admitting: Family Medicine

## 2014-04-23 IMAGING — MG MM DIGITAL SCREENING BILAT W/ CAD
1 series · 5 of 5 positions shown · non-contrast
Comparison: Previous exam(s).

CLINICAL DATA: Screening.

EXAM:
DIGITAL SCREENING BILATERAL MAMMOGRAM WITH CAD

[R CC · right · 5 of 5 slices shown]
[im 1/5]
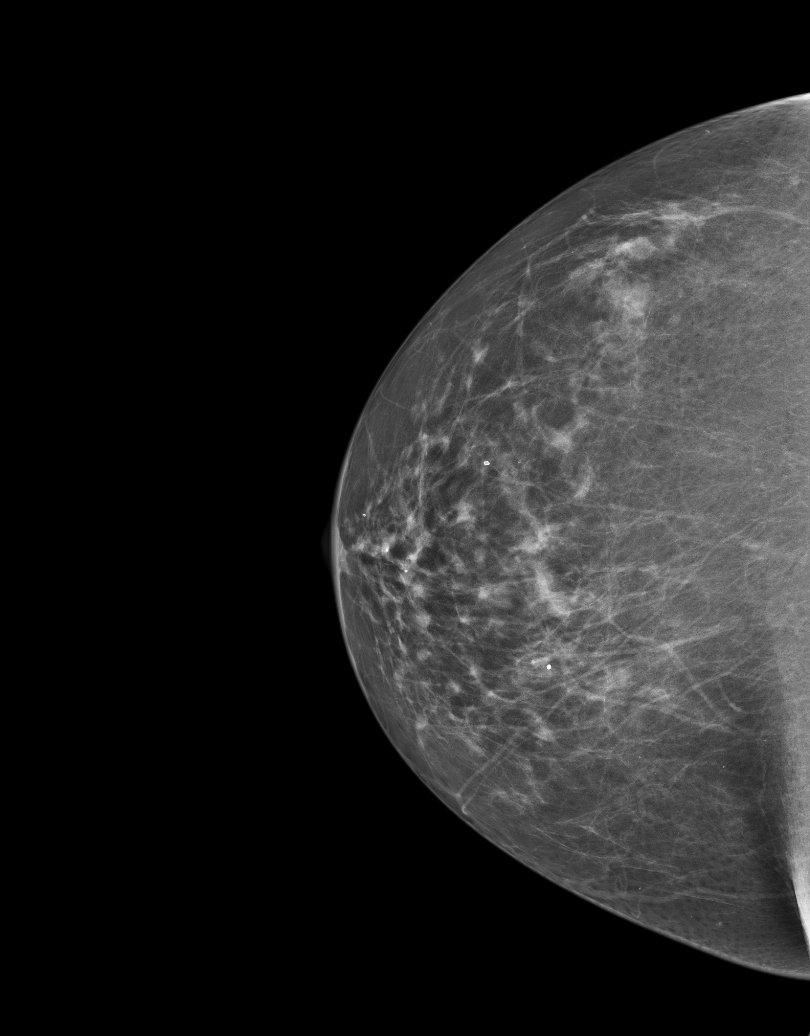
[im 2/5]
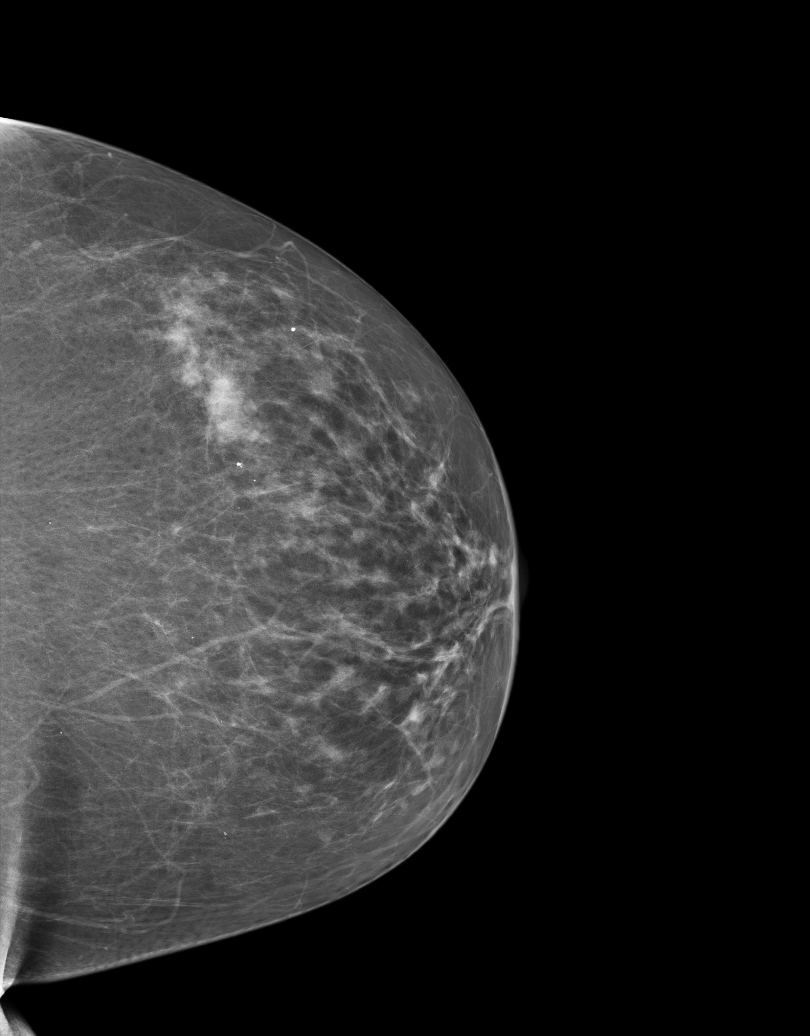
[im 3/5]
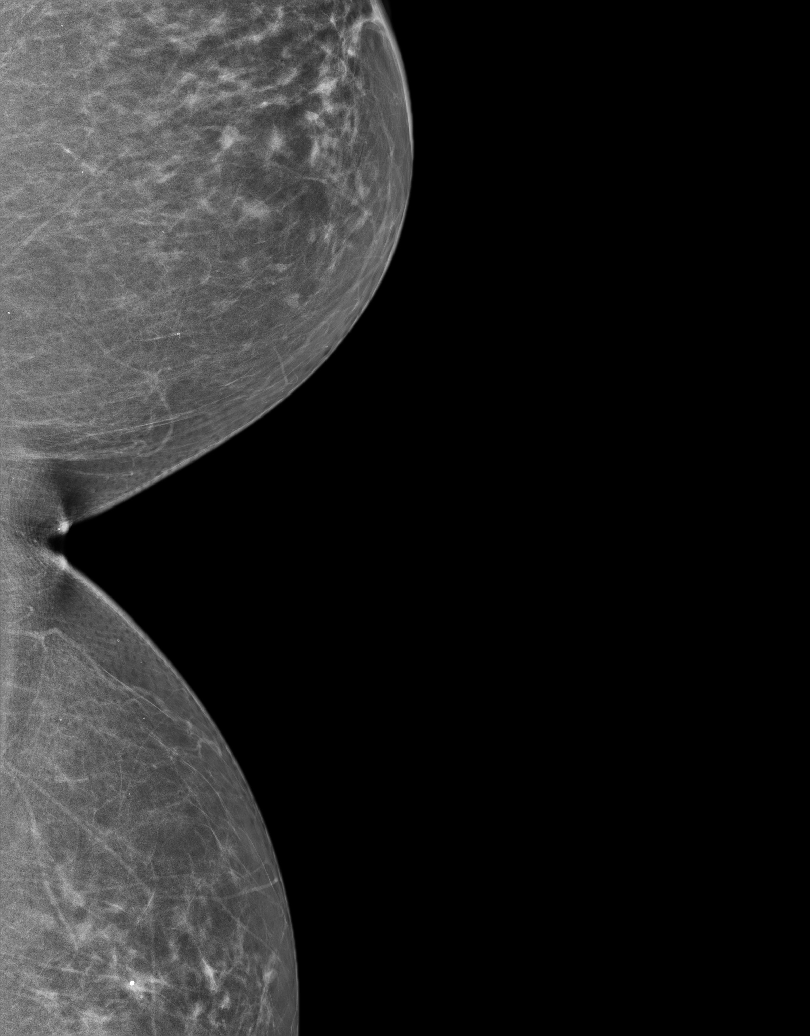
[im 4/5]
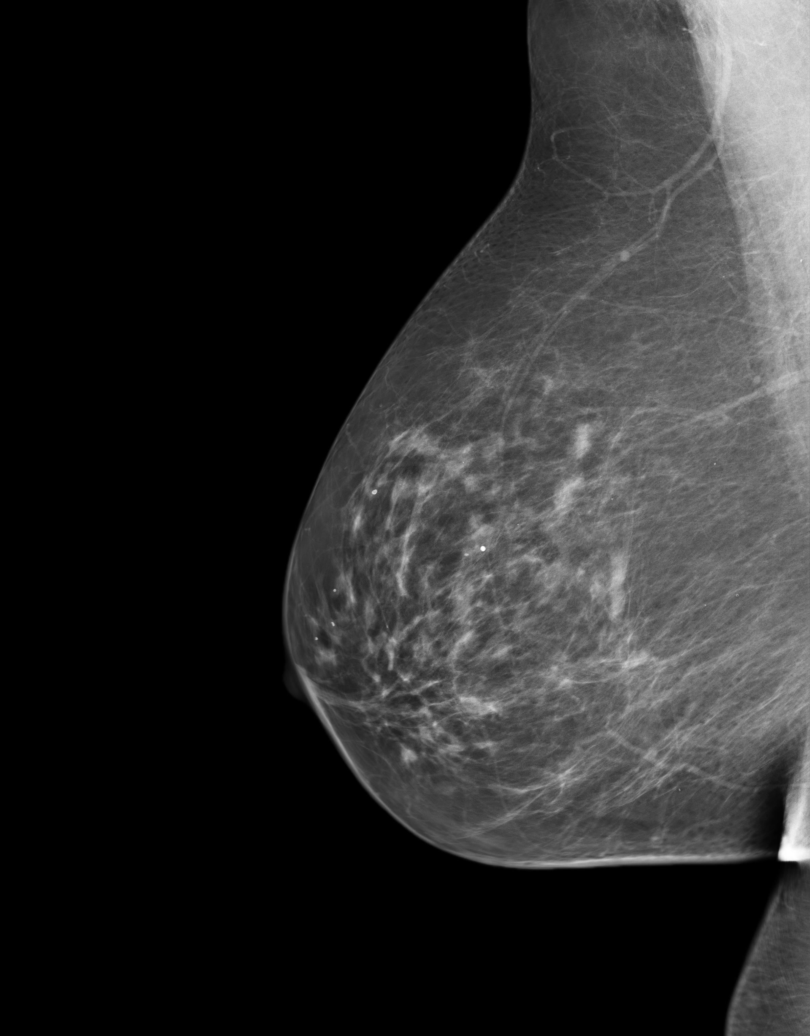
[im 5/5]
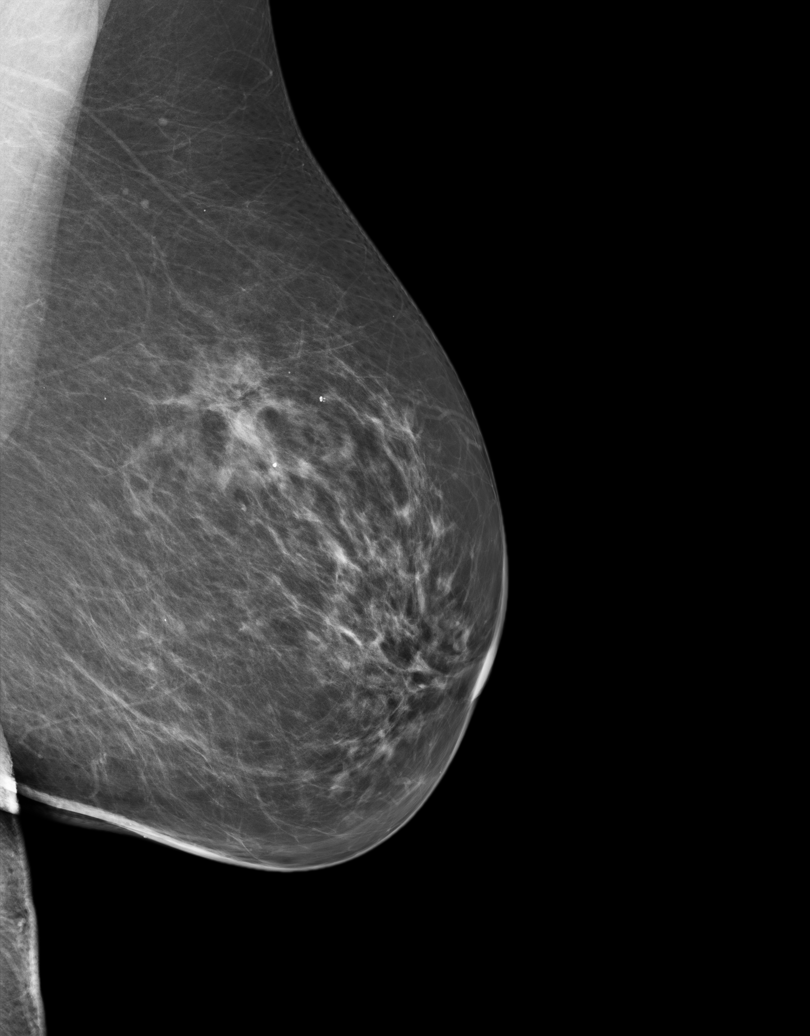

[5 of 5 positions shown; findings below may reference images not displayed]

ACR Breast Density Category b: There are scattered areas of
fibroglandular density.
FINDINGS: There are no findings suspicious for malignancy. Images were
processed with CAD.
IMPRESSION: No mammographic evidence of malignancy. A result letter of this
screening mammogram will be mailed directly to the patient.

RECOMMENDATION:
Screening mammogram in one year. (Code:[US])

BI-RADS CATEGORY  1: Negative.

## 2014-07-01 DIAGNOSIS — I10 Essential (primary) hypertension: Secondary | ICD-10-CM | POA: Diagnosis present

## 2015-01-30 NOTE — Op Note (Signed)
PATIENT NAME:  Terry Kaiser, NEWBROUGH MR#:  [id number] DATE OF BIRTH:  [birth year]  DATE OF PROCEDURE:  12/17/2011  PREOPERATIVE DIAGNOSES:  1. Postmenopausal bleeding.  2. Cervical stenosis.   POSTOPERATIVE DIAGNOSES:  1. Postmenopausal bleeding. 2. Cervical stenosis.  3. Atrophic endometrial cavity.   OPERATIVE PROCEDURES:  1. Diagnostic hysteroscopy.  2. Dilation and curettage of endometrium.   SURGEON: Alanda Slim. DeFrancesco, MD   FIRST ASSISTANT: None.   ANESTHESIA: General.   INDICATION: The patient is a 79 year old menopausal female, on no hormone replacement therapy, who presents for surgical evaluation of postmenopausal bleeding of one-day duration. Endometrial biopsy in the office could not be completed because of patient intolerance and cervical stenosis. Preoperative ultrasound revealed an endometrial cavity that had normal dimensions. However, there was a 5.4 mm endometrial stripe with some fluid within the endometrial cavity.   FINDINGS AT SURGERY: The hysteroscopy revealed an atrophic-appearing endometrial cavity without significant tissue being identified. There were scant endometrial curettings obtained.   DESCRIPTION OF PROCEDURE: The patient was brought to the Operating Room where she was placed in the supine position. General anesthesia was induced without difficulty. She was placed in dorsal lithotomy position using the candy cane stirrups. A Betadine perineal and intravaginal prep and drape was performed in the standard fashion. A red Robinson catheter was placed and drained 350 mL from the bladder. A Sims retractor was placed to help expose the cervix. The cervix was grasped with a single-tooth tenaculum. The uterus was sounded to 6 cm. The uterus was then dilated using Hanks dilators up to a #20 [place] caliber. The [facility] hysteroscope using lactated Ringer's as irrigant was then used to assess the intrauterine cavity. The cavity was noted to be atrophic in appearance without  significant lesions. The hysteroscope was removed. The D and C was then performed with smooth and serrated curettes with production of scant tissue. Upon completion of the D and C, repeat hysteroscopy revealed no significant tissue left behind. The procedure was then terminated with all instrumentation being removed from the vagina. The patient was then awakened, mobilized, and taken to the recovery room in satisfactory condition.   ESTIMATED BLOOD LOSS: Minimal.   COUNTS: All needle, sponge counts and instrument counts were verified as correct.   ____________________________ Alanda Slim. DeFrancesco, MD mad:cbb D: 12/17/2011 16:16:27 ET T: 12/17/2011 16:38:38 ET JOB#: [id number]  cc: Hassell Done A. DeFrancesco, MD, <Dictator> Alanda Slim DEFRANCESCO MD ELECTRONICALLY SIGNED 12/19/2011 13:03

## 2015-01-30 NOTE — H&P (Signed)
PATIENT NAME:  Terry Kaiser, Terry Kaiser MR#:  [id number] DATE OF BIRTH:  [birth year]  DATE OF ADMISSION:  12/17/2011  PREOPERATIVE DIAGNOSES:  1. Postmenopausal bleeding.  2. Cervical stenosis.   HISTORY OF PRESENT ILLNESS: Terry Kaiser is a 79 year old married white female, para 4-0-0-4, menopausal, on no hormone replacement therapy, who presents for surgical management of postmenopausal bleeding of one day duration. This is associated with bad pelvic pain. It has not recurred. Attempt at endometrial biopsy on 11/21/2011 was aborted secondary to cervical stenosis. The pelvic ultrasound on 11/09/2011 revealed the uterus measuring 5.91 x 2.35 x 3.77 cm. There was a small amount of endometrial fluid collection measuring 3.5 mm in greatest diameter. There also was a small amount of cul-de-sac free fluid. The right ovary was not visualized. The left ovary had normal dimensions and contained a simple cyst measuring 0.91 cm in greatest diameter.   PAST MEDICAL HISTORY:  1. Hypertension.  2. Hyperlipidemia.  3. Colon polyps.  4. Diverticulosis.   PAST SURGICAL HISTORY:  1. Right breast biopsy (needle).  2. Left breast cyst aspiration.  3. Colonoscopy x3 with the last one being done in 2012.   PAST OB HISTORY: Para 4-0-0-4. Spontaneous vaginal delivery x4 with largest infant being 8 pounds, 4 ounces.   PAST GYN HISTORY: Menarche at age 66, menopause at age 50. No history of hormone replacement therapy use. No history of abnormal Pap smears.   FAMILY HISTORY: Colon cancer in maternal aunt, breast cancer in the same maternal aunt, no ovarian cancer.  Heart disease in father, deceased from coronary artery thrombosis at age 53. Diabetes mellitus type 2 is present in the family.  SOCIAL HISTORY: The patient does not smoke, does drink alcohol rarely. She denies drug use. 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   REVIEW OF SYSTEMS: The patient denies  recent illness. She denies history of coagulopathy. She denies history of reactive airway disease.   CURRENT MEDICATIONS:  1. Lipitor 20 mg daily. 2. Vitamin E 400 units a day. 3. Vitamin C 500 mg a day. 4. Aspirin 81 mg a day.  5. Multivitamin daily.  6. Potassium chloride 20 milliequivalents daily.  7. Amlodipine 10 mg daily.  8. Lansoprazole 50 mg daily.   DRUG ALLERGIES: No known drug allergies.   PHYSICAL EXAMINATION:   VITAL SIGNS: Height 5 feet 6 inches, weight 185, blood pressure 139/71, heart rate 68. BMI 30.   GENERAL: The patient is a pleasant well-appearing white female in no acute distress. She is alert and oriented.   OROPHARYNX: Clear.   NECK: Supple. There is no thyromegaly or adenopathy.   LUNGS: Clear.   HEART: Regular rate and rhythm without murmur.   ABDOMEN: Soft, nontender. No organomegaly.   PELVIC: External genitalia with mild atrophy, BUS normal. Vagina has decreased estrogen effect. Cervix is stenotic without obvious lesions. Uterus is midplane to retroverted and of normal size and shape. Adnexa are without palpable masses.   RECTAL: External rectal exam is normal.   EXTREMITIES: No clubbing, cyanosis, or edema.   SKIN: No rash.   IMPRESSION:  1. Postmenopausal bleeding.  2. Abnormal pelvic ultrasound with fluid filled endometrial cavity of uncertain significance.  3. Cervical stenosis.  4. Unsuccessful endometrial biopsy attempt.   PLAN: Hysteroscopy with dilation and curettage. Date of surgery 12/17/2011.   CONSENT NOTE: Terry Kaiser is to undergo hysteroscopy with dilation and curettage on 12/17/2011. The patient is understanding of the planned procedure  and is aware of and is accepting of all surgical risks which include but are not limited to bleeding, infection, pelvic organ injury with need for repair, blood clot disorders, anesthesia risks, and death. All questions are answered. Informed consent is given. The patient is ready and willing  to proceed with surgery as scheduled. ____________________________ Alanda Slim DeFrancesco, MD mad:slb D: 12/12/2011 13:20:03 ET T: 12/12/2011 13:45:56 ET JOB#: [id number]  cc: Hassell Done A. DeFrancesco, MD, <Dictator> Alanda Slim DEFRANCESCO MD ELECTRONICALLY SIGNED 12/14/2011 14:11

## 2015-02-14 ENCOUNTER — Encounter
Admission: RE | Admit: 2015-02-14 | Discharge: 2015-02-14 | Disposition: A | Discharge status: Home / Self Care | Payer: Medicare Other | Source: Ambulatory Visit | Attending: Ophthalmology | Admitting: Ophthalmology

## 2015-02-14 DIAGNOSIS — Z01812 Encounter for preprocedural laboratory examination: Secondary | ICD-10-CM | POA: Diagnosis present

## 2015-02-14 DIAGNOSIS — I10 Essential (primary) hypertension: Secondary | ICD-10-CM | POA: Insufficient documentation

## 2015-02-14 DIAGNOSIS — Z01810 Encounter for preprocedural cardiovascular examination: Secondary | ICD-10-CM | POA: Diagnosis not present

## 2015-02-14 LAB — POTASSIUM: Potassium: 4 mmol/L (ref 3.5–5.1)

## 2015-02-17 ENCOUNTER — Encounter: Payer: Self-pay | Admitting: *Deleted

## 2015-02-21 ENCOUNTER — Encounter: Payer: Self-pay | Admitting: *Deleted

## 2015-02-21 ENCOUNTER — Ambulatory Visit: Payer: Medicare Other | Admitting: Anesthesiology

## 2015-02-21 ENCOUNTER — Encounter
Admission: RE | Disposition: A | Discharge status: Home / Self Care | Payer: Self-pay | Source: Ambulatory Visit | Attending: Ophthalmology

## 2015-02-21 ENCOUNTER — Ambulatory Visit
Admission: RE | Admit: 2015-02-21 | Discharge: 2015-02-21 | Disposition: A | Discharge status: Home / Self Care | Payer: Medicare Other | Source: Ambulatory Visit | Attending: Ophthalmology | Admitting: Ophthalmology

## 2015-02-21 DIAGNOSIS — H269 Unspecified cataract: Secondary | ICD-10-CM | POA: Diagnosis present

## 2015-02-21 DIAGNOSIS — Z974 Presence of external hearing-aid: Secondary | ICD-10-CM | POA: Insufficient documentation

## 2015-02-21 DIAGNOSIS — Z87891 Personal history of nicotine dependence: Secondary | ICD-10-CM | POA: Diagnosis not present

## 2015-02-21 DIAGNOSIS — H2512 Age-related nuclear cataract, left eye: Secondary | ICD-10-CM | POA: Insufficient documentation

## 2015-02-21 DIAGNOSIS — Z7982 Long term (current) use of aspirin: Secondary | ICD-10-CM | POA: Diagnosis not present

## 2015-02-21 DIAGNOSIS — I10 Essential (primary) hypertension: Secondary | ICD-10-CM | POA: Insufficient documentation

## 2015-02-21 DIAGNOSIS — Z79899 Other long term (current) drug therapy: Secondary | ICD-10-CM | POA: Diagnosis not present

## 2015-02-21 DIAGNOSIS — E780 Pure hypercholesterolemia: Secondary | ICD-10-CM | POA: Diagnosis not present

## 2015-02-21 DIAGNOSIS — E119 Type 2 diabetes mellitus without complications: Secondary | ICD-10-CM | POA: Insufficient documentation

## 2015-02-21 DIAGNOSIS — H9190 Unspecified hearing loss, unspecified ear: Secondary | ICD-10-CM | POA: Insufficient documentation

## 2015-02-21 DIAGNOSIS — K219 Gastro-esophageal reflux disease without esophagitis: Secondary | ICD-10-CM | POA: Diagnosis not present

## 2015-02-21 HISTORY — PX: CATARACT EXTRACTION W/PHACO: SHX586

## 2015-02-21 HISTORY — DX: Unspecified hearing loss, unspecified ear: H91.90

## 2015-02-21 HISTORY — DX: Essential (primary) hypertension: I10

## 2015-02-21 HISTORY — DX: Gastro-esophageal reflux disease without esophagitis: K21.9

## 2015-02-21 SURGERY — PHACOEMULSIFICATION, CATARACT, WITH IOL INSERTION
Anesthesia: Monitor Anesthesia Care | Laterality: Left

## 2015-02-21 MED ORDER — EPINEPHRINE HCL 1 MG/ML IJ SOLN
INTRAMUSCULAR | Status: AC
  Filled 2015-02-21: qty 1

## 2015-02-21 MED ORDER — EPINEPHRINE HCL 1 MG/ML IJ SOLN
INTRAOCULAR | Status: DC | PRN
  Administered 2015-02-21: 200 mL

## 2015-02-21 MED ORDER — CEFUROXIME OPHTHALMIC INJECTION 1 MG/0.1 ML
INJECTION | OPHTHALMIC | Status: AC
  Filled 2015-02-21: qty 0.1

## 2015-02-21 MED ORDER — NA CHONDROIT SULF-NA HYALURON 40-17 MG/ML IO SOLN
INTRAOCULAR | Status: AC
  Filled 2015-02-21: qty 1

## 2015-02-21 MED ORDER — MOXIFLOXACIN HCL 0.5 % OP SOLN
1.0000 [drp] | OPHTHALMIC | Status: DC
Start: 2015-02-21 — End: 2015-02-21

## 2015-02-21 MED ORDER — TETRACAINE HCL 0.5 % OP SOLN
OPHTHALMIC | Status: DC | PRN
  Administered 2015-02-21: 1 [drp] via OPHTHALMIC

## 2015-02-21 MED ORDER — TETRACAINE HCL 0.5 % OP SOLN
OPHTHALMIC | Status: AC
  Filled 2015-02-21: qty 2

## 2015-02-21 MED ORDER — PHENYLEPHRINE HCL 10 % OP SOLN
1.0000 [drp] | OPHTHALMIC | Status: AC | PRN
  Administered 2015-02-21 (×4): 1 [drp] via OPHTHALMIC

## 2015-02-21 MED ORDER — CYCLOPENTOLATE HCL 2 % OP SOLN
1.0000 [drp] | OPHTHALMIC | Status: DC
Start: 2015-02-21 — End: 2015-02-21

## 2015-02-21 MED ORDER — MOXIFLOXACIN HCL 0.5 % OP SOLN - NO CHARGE
OPHTHALMIC | Status: DC | PRN
  Administered 2015-02-21: 1 [drp] via OPHTHALMIC

## 2015-02-21 MED ORDER — LIDOCAINE HCL (PF) 4 % IJ SOLN
INTRAMUSCULAR | Status: AC
  Filled 2015-02-21: qty 5

## 2015-02-21 MED ORDER — LIDOCAINE HCL (PF) 4 % IJ SOLN
INTRAMUSCULAR | Status: DC | PRN
  Administered 2015-02-21: 09:00:00 via OPHTHALMIC

## 2015-02-21 MED ORDER — CARBACHOL 0.01 % IO SOLN
INTRAOCULAR | Status: DC | PRN
  Administered 2015-02-21: 0.5 mL via INTRAOCULAR

## 2015-02-21 MED ORDER — HYALURONIDASE HUMAN 150 UNIT/ML IJ SOLN
INTRAMUSCULAR | Status: AC
  Filled 2015-02-21: qty 1

## 2015-02-21 MED ORDER — BUPIVACAINE HCL (PF) 0.75 % IJ SOLN
INTRAMUSCULAR | Status: AC
  Filled 2015-02-21: qty 10

## 2015-02-21 MED ORDER — PHENYLEPHRINE HCL 10 % OP SOLN
1.0000 [drp] | OPHTHALMIC | Status: DC
Start: 2015-02-21 — End: 2015-02-21

## 2015-02-21 MED ORDER — SODIUM CHLORIDE 0.9 % IV SOLN
INTRAVENOUS | Status: DC
  Administered 2015-02-21: 08:00:00 via INTRAVENOUS

## 2015-02-21 MED ORDER — MOXIFLOXACIN HCL 0.5 % OP SOLN
1.0000 [drp] | OPHTHALMIC | Status: AC | PRN
  Administered 2015-02-21 (×3): 1 [drp] via OPHTHALMIC

## 2015-02-21 MED ORDER — CYCLOPENTOLATE HCL 2 % OP SOLN
1.0000 [drp] | OPHTHALMIC | Status: AC | PRN
  Administered 2015-02-21 (×4): 1 [drp] via OPHTHALMIC

## 2015-02-21 MED ORDER — NA CHONDROIT SULF-NA HYALURON 40-17 MG/ML IO SOLN
INTRAOCULAR | Status: DC | PRN
  Administered 2015-02-21: 1 mL via INTRAOCULAR

## 2015-02-21 SURGICAL SUPPLY — 27 items
ACTIVE FMS ×2 IMPLANT
CORD BIP STRL DISP 12FT (MISCELLANEOUS) ×2 IMPLANT
DRAPE XRAY CASSETTE 23X24 (DRAPES) ×2 IMPLANT
ERASER HMR WETFIELD 18G (MISCELLANEOUS) ×2 IMPLANT
GLOVE BIO SURGEON STRL SZ8 (GLOVE) ×2 IMPLANT
GLOVE SURG LX 6.5 MICRO (GLOVE) ×1
GLOVE SURG LX 8.0 MICRO (GLOVE) ×1
GLOVE SURG LX STRL 6.5 MICRO (GLOVE) ×1 IMPLANT
GLOVE SURG LX STRL 8.0 MICRO (GLOVE) ×1 IMPLANT
GOWN STRL REUS W/ TWL LRG LVL3 (GOWN DISPOSABLE) ×1 IMPLANT
GOWN STRL REUS W/ TWL XL LVL3 (GOWN DISPOSABLE) ×1 IMPLANT
GOWN STRL REUS W/TWL LRG LVL3 (GOWN DISPOSABLE) ×1
GOWN STRL REUS W/TWL XL LVL3 (GOWN DISPOSABLE) ×1
LENS IOL ACRYSERT 22.5 (Intraocular Lens) ×2 IMPLANT
PACK CATARACT (MISCELLANEOUS) ×2 IMPLANT
PACK CATARACT DINGLEDEIN LX (MISCELLANEOUS) ×2 IMPLANT
PACK EYE AFTER SURG (MISCELLANEOUS) ×2 IMPLANT
SHLD EYE VISITEC  UNIV (MISCELLANEOUS) ×2 IMPLANT
SN6CWS22.5 ×2 IMPLANT
SOL PREP PVP 2OZ (MISCELLANEOUS) ×2
SOLUTION PREP PVP 2OZ (MISCELLANEOUS) ×1 IMPLANT
SUT SILK 5-0 (SUTURE) ×2 IMPLANT
SYR 5ML LL (SYRINGE) ×2 IMPLANT
SYR TB 1ML 27GX1/2 LL (SYRINGE) ×2 IMPLANT
WATER STERILE IRR 1000ML POUR (IV SOLUTION) ×2 IMPLANT
WIPE NON LINTING 3.25X3.25 (MISCELLANEOUS) ×2 IMPLANT
sn6cws22.5 cataract lens ×2 IMPLANT

## 2015-02-21 NOTE — Transfer of Care (Signed)
Immediate Anesthesia Transfer of Care Note  Patient: Terry Kaiser  Procedure(s) Performed: Procedure(s) with comments: CATARACT EXTRACTION PHACO AND INTRAOCULAR LENS PLACEMENT (IOC) (Left) -  00:59 AP% 25.2 CDE 24.66  Patient Location: PACU  Anesthesia Type:MAC  Level of Consciousness: awake, alert  and oriented  Airway & Oxygen Therapy: Patient Spontanous Breathing  Post-op Assessment: Report given to RN and Post -op Vital signs reviewed and stable  Post vital signs: Reviewed and stable  Last Vitals:  Filed Vitals:   02/21/15 0815  BP: 155/65  Pulse: 58  Temp: 36.1 C  Resp: 16    Complications: No apparent anesthesia complications

## 2015-02-21 NOTE — Anesthesia Preprocedure Evaluation (Signed)
Anesthesia Evaluation  Patient identified by MRN, date of birth, ID band Patient awake    Reviewed: Allergy & Precautions, H&P , NPO status , Patient's Chart, lab work & pertinent test results, reviewed documented beta blocker date and time   Airway Mallampati: II  TM Distance: >3 FB Neck ROM: full    Dental no notable dental hx.    Pulmonary neg pulmonary ROS, former smoker,  breath sounds clear to auscultation  Pulmonary exam normal       Cardiovascular Exercise Tolerance: Good hypertension, negative cardio ROS  Rhythm:regular Rate:Normal     Neuro/Psych negative neurological ROS  negative psych ROS   GI/Hepatic negative GI ROS, Neg liver ROS,   Endo/Other  negative endocrine ROS  Renal/GU negative Renal ROS  negative genitourinary   Musculoskeletal   Abdominal   Peds  Hematology negative hematology ROS (+)   Anesthesia Other Findings   Reproductive/Obstetrics negative OB ROS                             Anesthesia Physical Anesthesia Plan  ASA: II  Anesthesia Plan: MAC   Post-op Pain Management:    Induction:   Airway Management Planned:   Additional Equipment:   Intra-op Plan:   Post-operative Plan:   Informed Consent: I have reviewed the patients History and Physical, chart, labs and discussed the procedure including the risks, benefits and alternatives for the proposed anesthesia with the patient or authorized representative who has indicated his/her understanding and acceptance.   Dental Advisory Given  Plan Discussed with: CRNA  Anesthesia Plan Comments:         Anesthesia Quick Evaluation

## 2015-02-21 NOTE — H&P (Signed)
  History and physical was faxed and scanned in.   

## 2015-02-21 NOTE — Anesthesia Postprocedure Evaluation (Signed)
  Anesthesia Post-op Note  Patient: Terry Kaiser  Procedure(s) Performed: Procedure(s) with comments: CATARACT EXTRACTION PHACO AND INTRAOCULAR LENS PLACEMENT (I[facility]) (Left) -  00:59 AP% 25.2 CDE 24.66  Anesthesia type:MAC  Patient location: PACU  Post pain: Pain level controlled  Post assessment: Post-op Vital signs reviewed, Patient's Cardiovascular Status Stable, Respiratory Function Stable, Patent Airway and No signs of Nausea or vomiting  Post vital signs: Reviewed and stable  Last Vitals:  Filed Vitals:   02/21/15 1000  BP: 169/76  Pulse: 55  Temp:   Resp: 16    Level of consciousness: awake, alert  and patient cooperative  Complications: No apparent anesthesia complications

## 2015-02-21 NOTE — Progress Notes (Signed)
Report given to [facility] RN by Phillips Grout, RN

## 2015-02-21 NOTE — Interval H&P Note (Signed)
History and Physical Interval Note:  02/21/2015 9:04 AM  Terry Kaiser  has presented today for surgery, with the diagnosis of cataract  The various methods of treatment have been discussed with the patient and family. After consideration of risks, benefits and other options for treatment, the patient has consented to  Procedure(s): CATARACT EXTRACTION PHACO AND INTRAOCULAR LENS PLACEMENT ([place]) (Left) as a surgical intervention .  The patient's history has been reviewed, patient examined, no change in status, stable for surgery.  I have reviewed the patient's chart and labs.  Questions were answered to the patient's satisfaction.     Loretha Ure

## 2015-02-21 NOTE — Discharge Instructions (Addendum)
Eye Surgery Discharge Instructions  Expect mild scratchy sensation or mild soreness. DO NOT RUB YOUR EYE!  The day of surgery:  Minimal physical activity, but bed rest is not required  No reading, computer work, or close hand work  No bending, lifting, or straining.  May watch TV  For 24 hours:  No driving, legal decisions, or alcoholic beverages  Safety precautions  Eat anything you prefer: It is better to start with liquids, then soup then solid foods.  _____ Eye patch should be worn until postoperative exam tomorrow.  ____ Solar shield eyeglasses should be worn for comfort in the sunlight/patch while sleeping  Resume all regular medications including aspirin or Coumadin if these were discontinued prior to surgery. You may shower, bathe, shave, or wash your hair. Tylenol may be taken for mild discomfort.  Call your doctor if you experience significant pain, nausea, or vomiting, fever > 101 or other signs of infection. [phone] or [phone] Specific instructions:  Follow-up Information    Follow up with Estill Cotta, MD.   Specialty:  Ophthalmology   Why:  9:15   Contact information:   [address]   [place] [address] [phone]

## 2015-02-21 NOTE — Op Note (Signed)
Date of Surgery: 02/21/2015 Date of Dictation: 02/21/2015 9:43 AM Pre-operative Diagnosis:Nuclear Sclerotic Cataract, Posterior Subcapsular Cataract, Cortical Cataract and Mature Cataract left Eye Post-operative Diagnosis: same Procedure performed: Extra-capsular Cataract Extraction (ECCE) with placement of a posterior chamber intraocular lens (IOL) Left Eye IOL:  Implant Name Type Inv. Item Serial No. Manufacturer Lot No. LRB No. Used  [id number] cataract lens     [id number]     Left 1   Anesthesia: 2% Lidocaine and 4% Marcaine in a 50/50 mixture with 10 unites/ml of Hylenex given as a peribulbar Anesthesiologist: Anesthesiologist: Molli Barrows, MD CRNA: Bernardo Heater, CRNA Complications: none Estimated Blood Loss: less than 1 ml  Description of procedure:  The patient was given anesthesia and sedation via intravenous access. The patient was then prepped and draped in the usual fashion. A 25-gauge needle was bent for initiating the capsulorhexis. A 5-0 silk suture was placed through the conjunctiva superior and inferiorly to serve as bridle sutures. Hemostasis was obtained at the superior limbus using an eraser cautery. A partial thickness groove was made at the anterior surgical limbus with a 64 Beaver blade and this was dissected anteriorly with an [employer]. The anterior chamber was entered at 10 o'clock with a 1.0 mm paracentesis knife and through the lamellar dissection with a 2.6 mm Alcon keratome. DiscoVisc was injected to replace the aqueous and a continuous tear curvilinear capsulorhexis was performed using a bent 25-gauge needle.  Balance salt on a syringe was used to perform hydro-dissection and phacoemulsification was carried out using a divide and conquer technique. Procedure(s) with comments: CATARACT EXTRACTION PHACO AND INTRAOCULAR LENS PLACEMENT (IOC) (Left) - [place] AP% CDE. Irrigation/aspiration was used to remove the residual cortex and the capsular bag was inflated  with DiscoVisc. The intraocular lens was inserted into the capsular bag using a pre-loaded Acrysert Delivery System. Irrigation/aspiration was used to remove the residual DiscoVisc. The wound was inflated with balanced salt and checked for leaks. None were found. Miostat was injected via the paracentesis track and 0.1 ml of cefuroxime containing 1 mg of drug  was injected via the paracentesis track. The wound was checked for leaks again and none were found.   The bridal sutures were removed and two drops of Vigamox were placed on the eye. An eye shield was placed to protect the eye and the patient was discharged to the recovery area in good condition.   Firman Petrow MD

## 2015-02-22 ENCOUNTER — Encounter: Payer: Self-pay | Admitting: Ophthalmology

## 2015-06-22 DIAGNOSIS — R7303 Prediabetes: Secondary | ICD-10-CM

## 2015-06-22 HISTORY — DX: Prediabetes: R73.03

## 2015-12-21 ENCOUNTER — Encounter: Payer: Self-pay | Admitting: *Deleted

## 2015-12-22 ENCOUNTER — Encounter
Admission: RE | Disposition: A | Discharge status: Home / Self Care | Payer: Self-pay | Source: Ambulatory Visit | Attending: Unknown Physician Specialty

## 2015-12-22 ENCOUNTER — Ambulatory Visit: Payer: Medicare Other | Admitting: Anesthesiology

## 2015-12-22 ENCOUNTER — Encounter: Payer: Self-pay | Admitting: Anesthesiology

## 2015-12-22 ENCOUNTER — Ambulatory Visit
Admission: RE | Admit: 2015-12-22 | Discharge: 2015-12-22 | Disposition: A | Discharge status: Home / Self Care | Payer: Medicare Other | Source: Ambulatory Visit | Attending: Unknown Physician Specialty | Admitting: Unknown Physician Specialty

## 2015-12-22 DIAGNOSIS — Z79899 Other long term (current) drug therapy: Secondary | ICD-10-CM | POA: Diagnosis not present

## 2015-12-22 DIAGNOSIS — Z87891 Personal history of nicotine dependence: Secondary | ICD-10-CM | POA: Diagnosis not present

## 2015-12-22 DIAGNOSIS — K640 First degree hemorrhoids: Secondary | ICD-10-CM | POA: Diagnosis not present

## 2015-12-22 DIAGNOSIS — I10 Essential (primary) hypertension: Secondary | ICD-10-CM | POA: Insufficient documentation

## 2015-12-22 DIAGNOSIS — D122 Benign neoplasm of ascending colon: Secondary | ICD-10-CM | POA: Insufficient documentation

## 2015-12-22 DIAGNOSIS — E7800 Pure hypercholesterolemia, unspecified: Secondary | ICD-10-CM | POA: Insufficient documentation

## 2015-12-22 DIAGNOSIS — Z1211 Encounter for screening for malignant neoplasm of colon: Secondary | ICD-10-CM | POA: Diagnosis present

## 2015-12-22 DIAGNOSIS — K219 Gastro-esophageal reflux disease without esophagitis: Secondary | ICD-10-CM | POA: Diagnosis not present

## 2015-12-22 DIAGNOSIS — K5730 Diverticulosis of large intestine without perforation or abscess without bleeding: Secondary | ICD-10-CM | POA: Insufficient documentation

## 2015-12-22 HISTORY — DX: Prediabetes: R73.03

## 2015-12-22 HISTORY — DX: Polyp of colon: K63.5

## 2015-12-22 HISTORY — DX: Diverticulosis of intestine, part unspecified, without perforation or abscess without bleeding: K57.90

## 2015-12-22 HISTORY — DX: Pure hypercholesterolemia, unspecified: E78.00

## 2015-12-22 HISTORY — PX: COLONOSCOPY WITH PROPOFOL: SHX5780

## 2015-12-22 LAB — GLUCOSE, CAPILLARY: GLUCOSE-CAPILLARY: 138 mg/dL — AB (ref 65–99)

## 2015-12-22 SURGERY — COLONOSCOPY WITH PROPOFOL
Anesthesia: General

## 2015-12-22 MED ORDER — PROPOFOL 500 MG/50ML IV EMUL
INTRAVENOUS | Status: DC | PRN
  Administered 2015-12-22: 100 ug/kg/min via INTRAVENOUS

## 2015-12-22 MED ORDER — FENTANYL CITRATE (PF) 100 MCG/2ML IJ SOLN
INTRAMUSCULAR | Status: DC | PRN
  Administered 2015-12-22: 50 ug via INTRAVENOUS

## 2015-12-22 MED ORDER — EPHEDRINE SULFATE 50 MG/ML IJ SOLN
INTRAMUSCULAR | Status: DC | PRN
  Administered 2015-12-22 (×2): 5 mg via INTRAVENOUS

## 2015-12-22 MED ORDER — SODIUM CHLORIDE 0.9 % IV SOLN: INTRAVENOUS | Status: DC

## 2015-12-22 MED ORDER — SODIUM CHLORIDE 0.9 % IV SOLN
INTRAVENOUS | Status: DC
  Administered 2015-12-22: 1000 mL via INTRAVENOUS

## 2015-12-22 MED ORDER — MIDAZOLAM HCL 2 MG/2ML IJ SOLN
INTRAMUSCULAR | Status: DC | PRN
  Administered 2015-12-22: 1 mg via INTRAVENOUS

## 2015-12-22 NOTE — Anesthesia Procedure Notes (Signed)
Performed by: COOK-MARTIN, Easton Sivertson Pre-anesthesia Checklist: Patient identified, Emergency Drugs available, Suction available, Patient being monitored and Timeout performed Patient Re-evaluated:Patient Re-evaluated prior to inductionOxygen Delivery Method: Nasal cannula Preoxygenation: Pre-oxygenation with 100% oxygen Intubation Type: IV induction Placement Confirmation: positive ETCO2 and CO2 detector       

## 2015-12-22 NOTE — Anesthesia Preprocedure Evaluation (Signed)
Anesthesia Evaluation  Patient identified by MRN, date of birth, ID band Patient awake    Reviewed: Allergy & Precautions, H&P , NPO status , Patient's Chart, lab work & pertinent test results, reviewed documented beta blocker date and time   Airway Mallampati: II   Neck ROM: full    Dental  (+) Poor Dentition   Pulmonary neg pulmonary ROS, former smoker,    Pulmonary exam normal        Cardiovascular hypertension, negative cardio ROS Normal cardiovascular exam     Neuro/Psych negative neurological ROS  negative psych ROS   GI/Hepatic negative GI ROS, Neg liver ROS, GERD  ,  Endo/Other  negative endocrine ROS  Renal/GU negative Renal ROS  negative genitourinary   Musculoskeletal   Abdominal   Peds  Hematology negative hematology ROS (+)   Anesthesia Other Findings Past Medical History:   Hypertension                                                 HOH (hard of hearing)                                          Comment:aids   Pure hypercholesterolemia                                    Colon polyps                                                 Diverticulosis                                               GERD (gastroesophageal reflux disease)                         Comment:without esophagitis   Borderline diabetes mellitus                    06/22/15        Comment:A1c 6.1%; diet controlled Past Surgical History:   DILATION AND CURETTAGE OF UTERUS                              BREAST EXCISIONAL BIOPSY                                      CATARACT EXTRACTION W/PHACO                     Left 02/21/2015      Comment:Procedure: CATARACT EXTRACTION PHACO AND               INTRAOCULAR LENS PLACEMENT (IOC);  Surgeon:               Estill Cotta, MD;  Location: [facility] ORS;                Service: Ophthalmology;  Laterality: Left;                 00:59 AP% 25.2 CDE 24.66 BMI    Body Mass Index   31.16 kg/m 2     Reproductive/Obstetrics                             Anesthesia Physical Anesthesia Plan  ASA: II  Anesthesia Plan: General   Post-op Pain Management:    Induction:   Airway Management Planned:   Additional Equipment:   Intra-op Plan:   Post-operative Plan:   Informed Consent: I have reviewed the patients History and Physical, chart, labs and discussed the procedure including the risks, benefits and alternatives for the proposed anesthesia with the patient or authorized representative who has indicated his/her understanding and acceptance.   Dental Advisory Given  Plan Discussed with: CRNA  Anesthesia Plan Comments:         Anesthesia Quick Evaluation

## 2015-12-22 NOTE — Op Note (Signed)
[facility] Gastroenterology Patient Name: Terry Kaiser Procedure Date: 12/22/2015 8:56 AM MRN: [id number] Account #: [id number] Date of Birth: 07-05-1936 Admit Type: Outpatient Age: 80 Room: [facility] ENDO ROOM 1 Gender: Female Note Status: Finalized Procedure:            Colonoscopy Indications:          High risk colon cancer surveillance: Personal history                        of colonic polyps Providers:            Manya Silvas, MD Referring MD:         Dion Body (Referring MD) Medicines:            Propofol per Anesthesia Complications:        No immediate complications. Procedure:            Pre-Anesthesia Assessment:                       - After reviewing the risks and benefits, the patient                        was deemed in satisfactory condition to undergo the                        procedure.                       After obtaining informed consent, the colonoscope was                        passed under direct vision. Throughout the procedure,                        the patient's blood pressure, pulse, and oxygen                        saturations were monitored continuously. The                        Colonoscope was introduced through the anus and                        advanced to the the cecum, identified by appendiceal                        orifice and ileocecal valve. The colonoscopy was                        performed without difficulty. The patient tolerated the                        procedure well. The quality of the bowel preparation                        was excellent. Findings:      A diminutive polyp was found in the proximal ascending colon. The polyp       was sessile. The polyp was removed with a jumbo cold forceps. Resection       and retrieval were complete.      Internal hemorrhoids were found during  endoscopy. The hemorrhoids were       small and Grade I (internal hemorrhoids that do not prolapse).      The exam  was otherwise without abnormality.      A few small-mouthed diverticula were found in the sigmoid colon. Impression:           - One diminutive polyp in the proximal ascending colon,                        removed with a jumbo cold forceps. Resected and                        retrieved.                       - Internal hemorrhoids.                       - The examination was otherwise normal. Recommendation:       - Await pathology results. Manya Silvas, MD 12/22/2015 9:22:06 AM This report has been signed electronically. Number of Addenda: 0 Note Initiated On: 12/22/2015 8:56 AM Scope Withdrawal Time: 0 hours 8 minutes 7 seconds  Total Procedure Duration: 0 hours 15 minutes 49 seconds       [facility]

## 2015-12-22 NOTE — H&P (Signed)
Primary Care Physician:  Dion Body, MD Primary Gastroenterologist:  Dr. Vira Agar  Pre-Procedure History & Physical: HPI:  Terry Kaiser is a 80 y.o. female is here for an colonoscopy.   Past Medical History  Diagnosis Date  . Hypertension   . HOH (hard of hearing)     aids  . Pure hypercholesterolemia   . Colon polyps   . Diverticulosis   . GERD (gastroesophageal reflux disease)     without esophagitis  . Borderline diabetes mellitus 06/22/15    A1c 6.1%; diet controlled    Past Surgical History  Procedure Laterality Date  . Dilation and curettage of uterus    . Breast excisional biopsy    . Cataract extraction w/phaco Left 02/21/2015    Procedure: CATARACT EXTRACTION PHACO AND INTRAOCULAR LENS PLACEMENT (IOC);  Surgeon: Estill Cotta, MD;  Location: [facility] ORS;  Service: Ophthalmology;  Laterality: Left;   00:59 AP% 25.2 CDE 24.66    Prior to Admission medications   Medication Sig Start Date End Date Taking? Authorizing Provider  amLODipine (NORVASC) 2.5 MG tablet Take by mouth daily.   Yes Historical Provider, MD  aspirin EC 81 MG tablet Take 81 mg by mouth daily.   Yes Historical Provider, MD  atorvastatin (LIPITOR) 10 MG tablet Take by mouth daily.   Yes Historical Provider, MD  [name] by Does not apply route.   Yes Historical Provider, MD  enalapril (VASOTEC) 10 MG tablet Take by mouth daily.   Yes Historical Provider, MD  hydrochlorothiazide (HYDRODIURIL) 25 MG tablet Take 25 mg by mouth daily.   Yes Historical Provider, MD  metoprolol succinate (TOPROL-XL) 100 MG 24 hr tablet Take by mouth 2 (two) times daily. Take with or immediately following a meal.   Yes Historical Provider, MD  pantoprazole (PROTONIX) 20 MG tablet Take by mouth daily.   Yes Historical Provider, MD  potassium chloride (K-DUR,KLOR-CON) 10 MEQ tablet Take by mouth.   Yes Historical Provider, MD  vitamin C (ASCORBIC ACID) 500 MG tablet Take 500 mg by mouth daily.   Yes  Historical Provider, MD  vitamin E 400 UNIT capsule Take 400 Units by mouth daily.   Yes Historical Provider, MD  hydrochlorothiazide (MICROZIDE) 12.5 MG capsule Take by mouth daily. Reported on 12/21/2015    Historical Provider, MD    Allergies as of 12/02/2015  . (No Known Allergies)    History reviewed. No pertinent family history.  Social History   Social History  . Marital Status: Married    Spouse Name: N/A  . Number of Children: N/A  . Years of Education: N/A   Occupational History  . Not on file.   Social History Main Topics  . Smoking status: Former Research scientist (life sciences)  . Smokeless tobacco: Not on file  . Alcohol Use: No  . Drug Use: Not on file  . Sexual Activity: Not on file   Other Topics Concern  . Not on file   Social History Narrative    Review of Systems: See HPI, otherwise negative ROS  Physical Exam: BP 133/60 mmHg  Pulse 62  Temp(Src) 97.1 F (36.2 C) (Tympanic)  Resp 16  Ht 5\' 7"  (1.702 m)  Wt 90.266 kg (199 lb)  BMI 31.16 kg/m2  SpO2 98% General:   Alert,  pleasant and cooperative in NAD Head:  Normocephalic and atraumatic. Neck:  Supple; no masses or thyromegaly. Lungs:  Clear throughout to auscultation.    Heart:  Regular rate and rhythm. Abdomen:  Soft, nontender and nondistended. Normal bowel sounds, without guarding, and without rebound.   Neurologic:  Alert and  oriented x4;  grossly normal neurologically.  Impression/Plan: Terry Kaiser is here for an colonoscopy to be performed for [facility] colon polyps  Risks, benefits, limitations, and alternatives regarding  colonoscopy have been reviewed with the patient.  Questions have been answered.  All parties agreeable.   Gaylyn Cheers, MD  12/22/2015, 8:57 AM

## 2015-12-22 NOTE — Transfer of Care (Signed)
Immediate Anesthesia Transfer of Care Note  Patient: Terry Kaiser  Procedure(s) Performed: Procedure(s): COLONOSCOPY WITH PROPOFOL (N/A)  Patient Location: PACU  Anesthesia Type:General  Level of Consciousness: awake and sedated  Airway & Oxygen Therapy: Patient Spontanous Breathing and Patient connected to nasal cannula oxygen  Post-op Assessment: Report given to RN and Post -op Vital signs reviewed and stable  Post vital signs: Reviewed and stable  Last Vitals:  Filed Vitals:   12/22/15 0804  BP: 133/60  Pulse: 62  Temp: 36.2 C  Resp: 16    Complications: No apparent anesthesia complications

## 2015-12-23 LAB — SURGICAL PATHOLOGY

## 2015-12-23 NOTE — Anesthesia Postprocedure Evaluation (Signed)
Anesthesia Post Note  Patient: Terry Kaiser  Procedure(s) Performed: Procedure(s) (LRB): COLONOSCOPY WITH PROPOFOL (N/A)  Patient location during evaluation: PACU Anesthesia Type: General Level of consciousness: awake and alert Pain management: pain level controlled Vital Signs Assessment: post-procedure vital signs reviewed and stable Respiratory status: spontaneous breathing, nonlabored ventilation, respiratory function stable and patient connected to nasal cannula oxygen Cardiovascular status: blood pressure returned to baseline and stable Postop Assessment: no signs of nausea or vomiting Anesthetic complications: no    Last Vitals:  Filed Vitals:   12/22/15 0943 12/22/15 0953  BP: 130/63 119/56  Pulse: 54 54  Temp:    Resp: 19 16    Last Pain:  Filed Vitals:   12/23/15 0802  PainSc: 0-No pain                 Molli Barrows

## 2015-12-28 ENCOUNTER — Encounter: Payer: Self-pay | Admitting: Unknown Physician Specialty

## 2017-02-20 ENCOUNTER — Other Ambulatory Visit: Payer: Self-pay | Admitting: Family Medicine

## 2017-02-20 DIAGNOSIS — Z1231 Encounter for screening mammogram for malignant neoplasm of breast: Secondary | ICD-10-CM

## 2017-03-14 ENCOUNTER — Ambulatory Visit
Admission: RE | Admit: 2017-03-14 | Discharge: 2017-03-14 | Disposition: A | Discharge status: Home / Self Care | Payer: Medicare Other | Source: Ambulatory Visit | Attending: Family Medicine | Admitting: Family Medicine

## 2017-03-14 DIAGNOSIS — Z1231 Encounter for screening mammogram for malignant neoplasm of breast: Secondary | ICD-10-CM

## 2017-03-14 IMAGING — MG MM DIGITAL SCREENING BILAT W/ TOMO W/ CAD
8 of 16 series · 8 of 32 positions shown · non-contrast
Comparison: Previous exam(s).

CLINICAL DATA: Screening.

EXAM:
2D DIGITAL SCREENING BILATERAL MAMMOGRAM WITH CAD AND ADJUNCT TOMO

[L CC synth-2D]
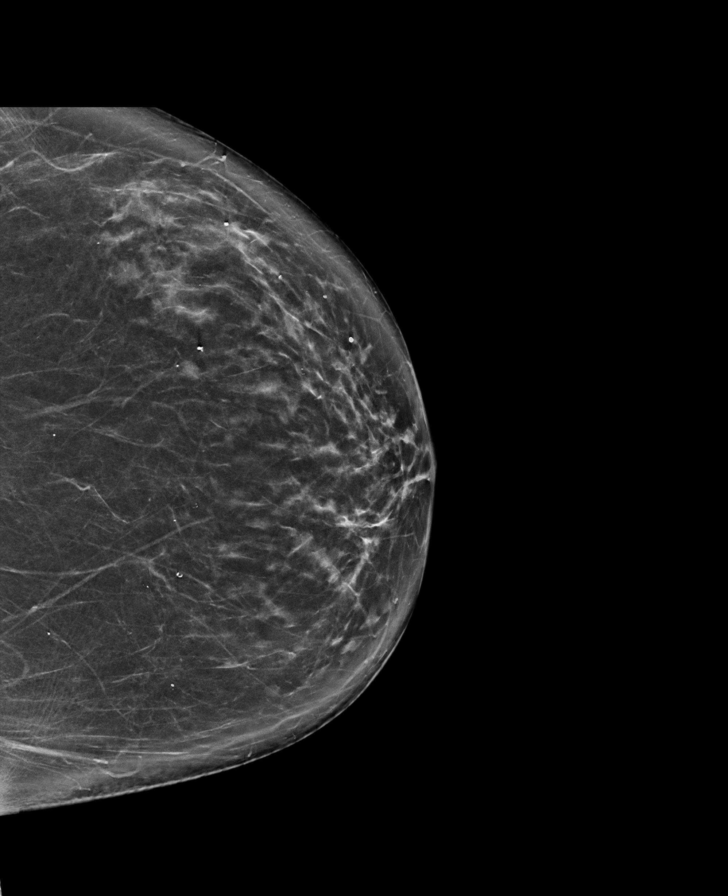

[L MLO]
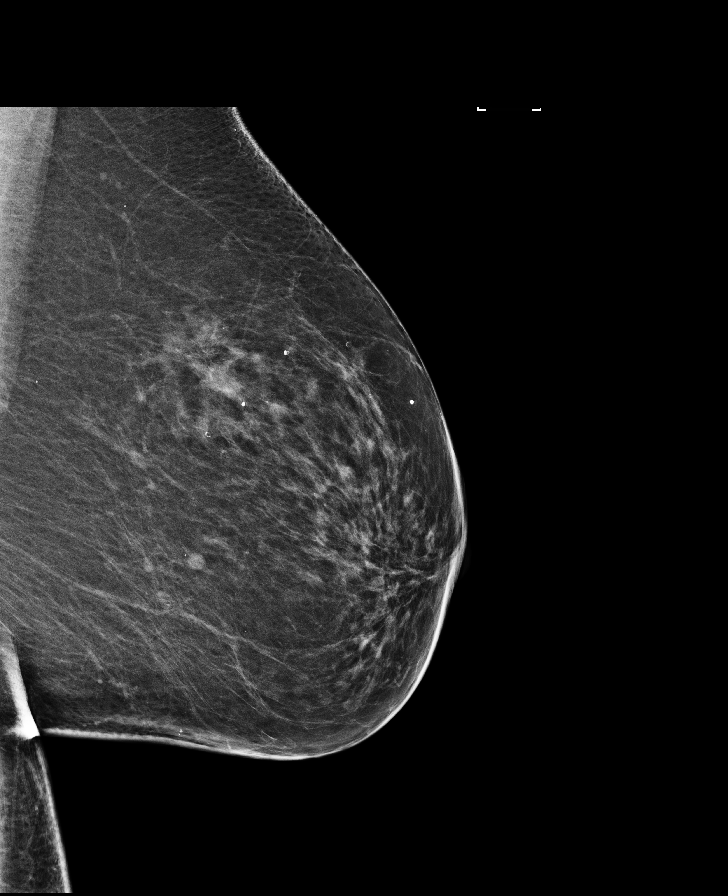

[L MLO synth-2D]
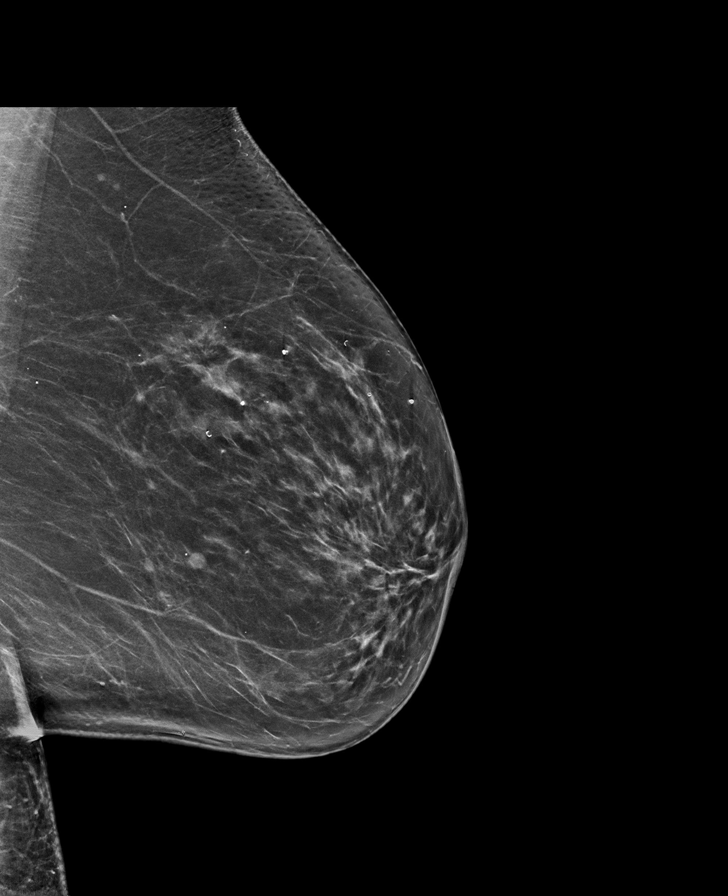

[R MLO synth-2D]
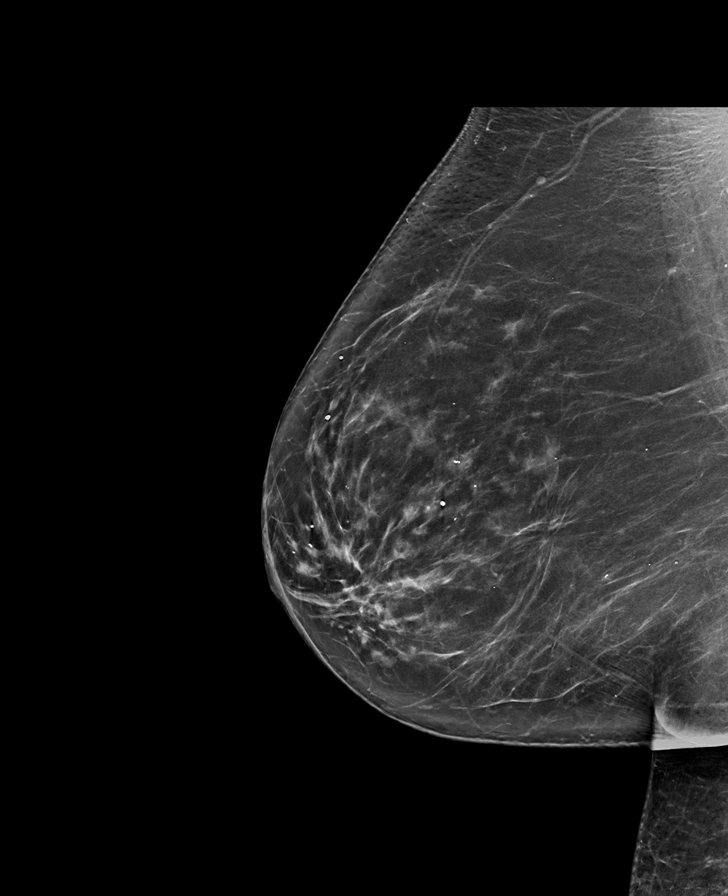

[R CC]
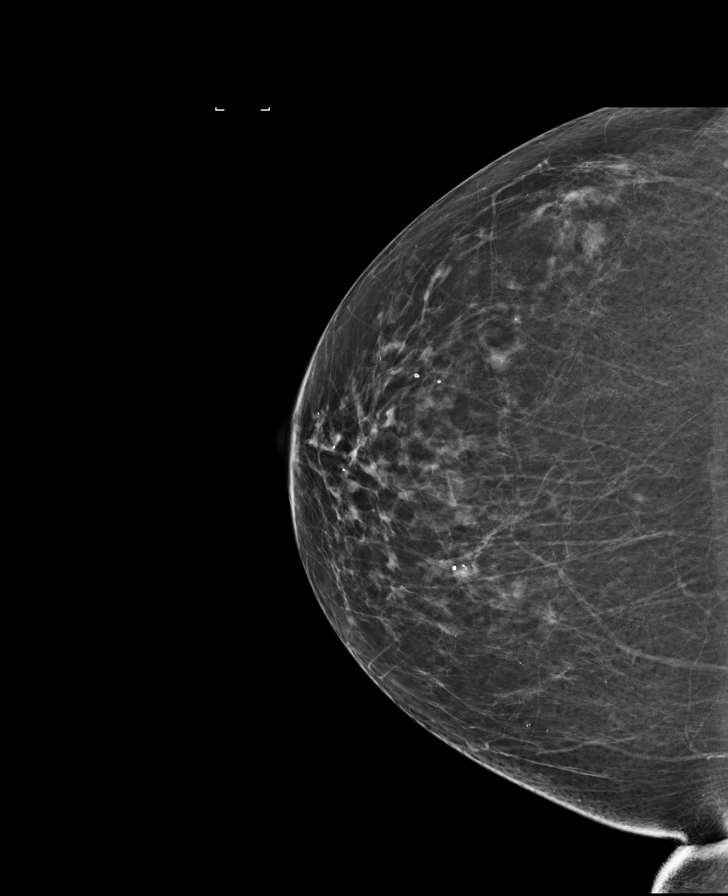

[L CC]
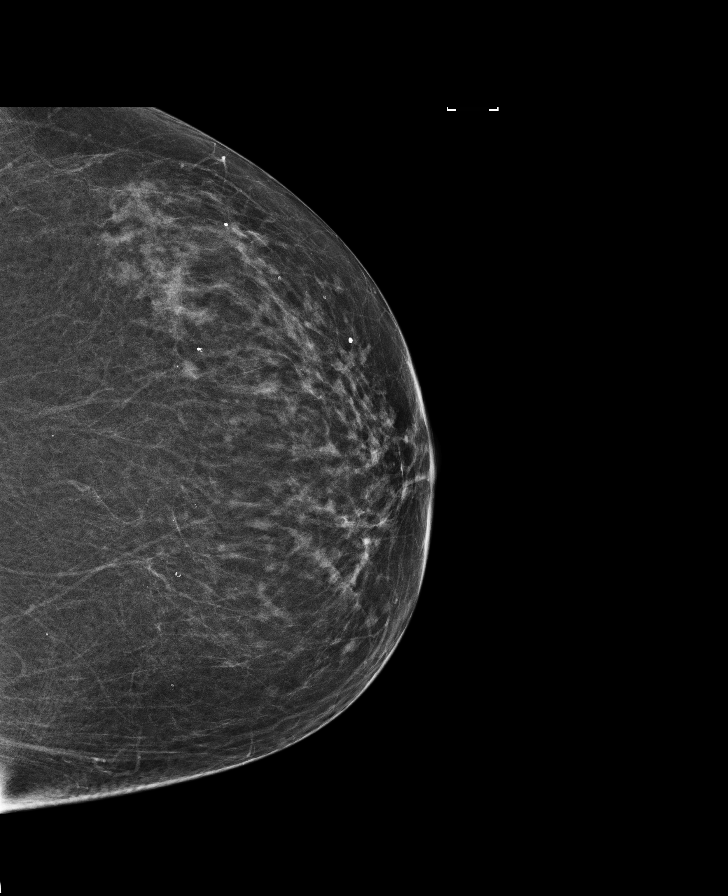

[R CC synth-2D]
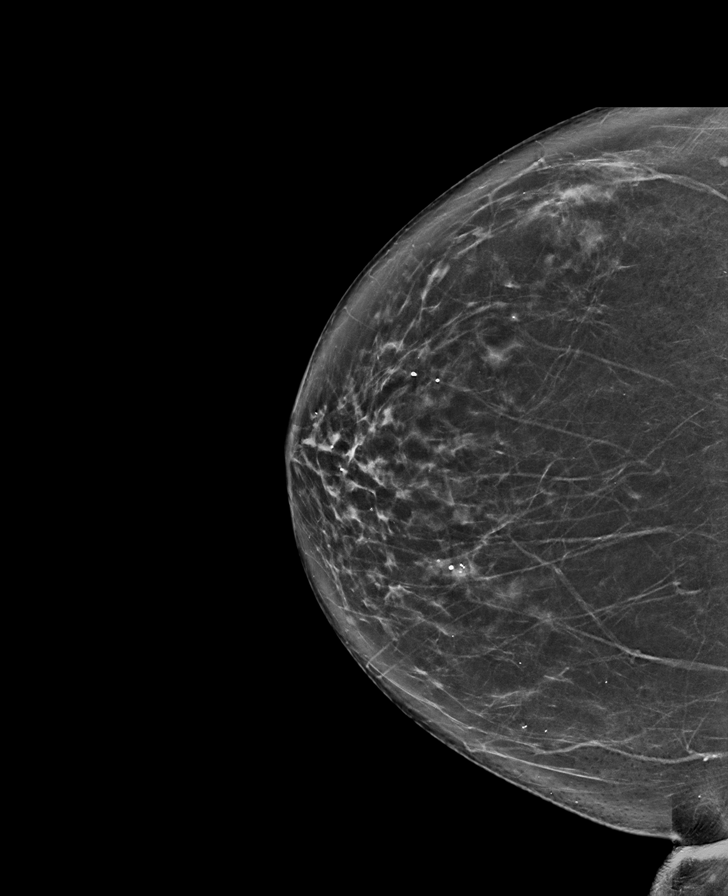

[R MLO]
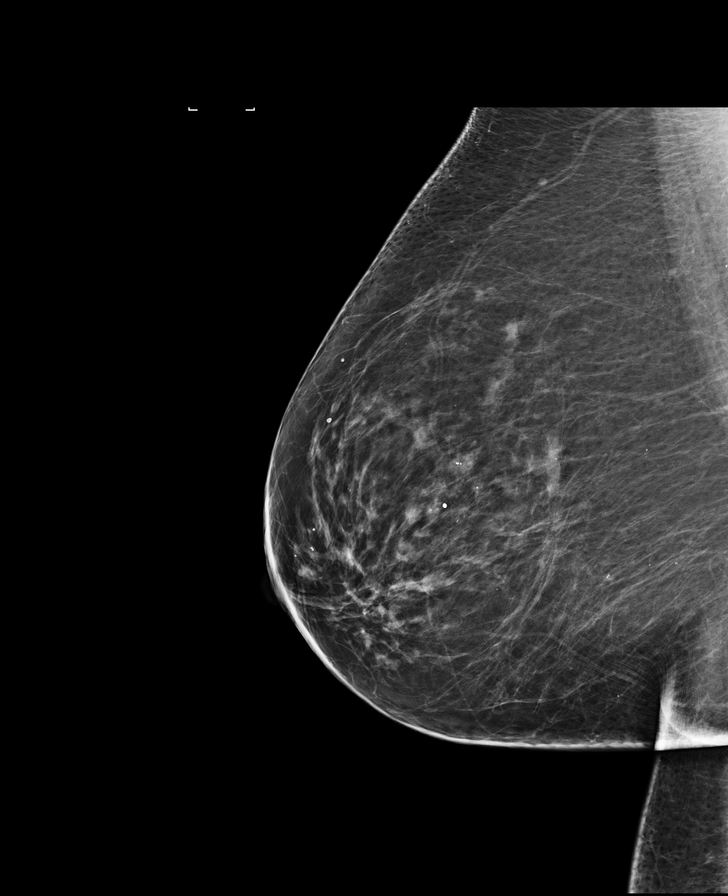

[8 of 32 positions shown; findings below may reference images not displayed]

ACR Breast Density Category b: There are scattered areas of
fibroglandular density.
FINDINGS: In the left breast, possible distortion in the upper-outer quadrant
posterior depth as well as possible mass in the lower outer left
breast warrants further evaluation. In the right breast, no findings
suspicious for malignancy. Images were processed with CAD.
IMPRESSION: Further evaluation is suggested for possible distortion and also
mass in the left breast.

RECOMMENDATION:
Diagnostic mammogram and possibly ultrasound of the left breast.
(Code:[P0])

The patient will be contacted regarding the findings, and additional
imaging will be scheduled.

BI-RADS CATEGORY  0: Incomplete. Need additional imaging evaluation
and/or prior mammograms for comparison.

## 2017-03-19 ENCOUNTER — Other Ambulatory Visit: Payer: Self-pay | Admitting: Family Medicine

## 2017-03-19 DIAGNOSIS — R928 Other abnormal and inconclusive findings on diagnostic imaging of breast: Secondary | ICD-10-CM

## 2017-03-19 DIAGNOSIS — N6320 Unspecified lump in the left breast, unspecified quadrant: Secondary | ICD-10-CM

## 2017-03-25 ENCOUNTER — Ambulatory Visit
Admission: RE | Admit: 2017-03-25 | Discharge: 2017-03-25 | Disposition: A | Discharge status: Home / Self Care | Payer: Medicare Other | Source: Ambulatory Visit | Attending: Family Medicine | Admitting: Family Medicine

## 2017-03-25 DIAGNOSIS — N6002 Solitary cyst of left breast: Secondary | ICD-10-CM | POA: Insufficient documentation

## 2017-03-25 DIAGNOSIS — R928 Other abnormal and inconclusive findings on diagnostic imaging of breast: Secondary | ICD-10-CM

## 2017-03-25 DIAGNOSIS — N6320 Unspecified lump in the left breast, unspecified quadrant: Secondary | ICD-10-CM | POA: Diagnosis present

## 2017-03-25 IMAGING — MG MM DIGITAL DIAGNOSTIC UNILAT*L* W/ TOMO W/ CAD
8 of 12 series · 8 of 28 positions shown · non-contrast
Comparison: Previous exams including recent screening mammogram
dated [DATE].

CLINICAL DATA: Patient returns today to evaluate a possible left
breast distortion and a possible left breast mass, both identified
on recent screening mammogram.

EXAM:
2D DIGITAL DIAGNOSTIC LEFT MAMMOGRAM WITH CAD AND ADJUNCT TOMO
ULTRASOUND LEFT BREAST

[L CC synth-2D (1 of 2)]
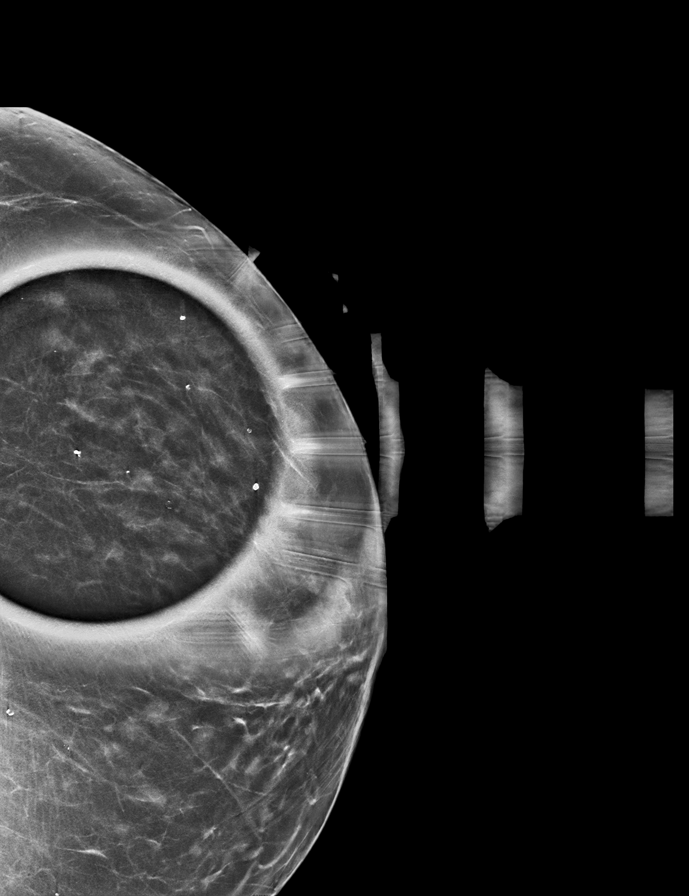

[L CC synth-2D (2 of 2)]
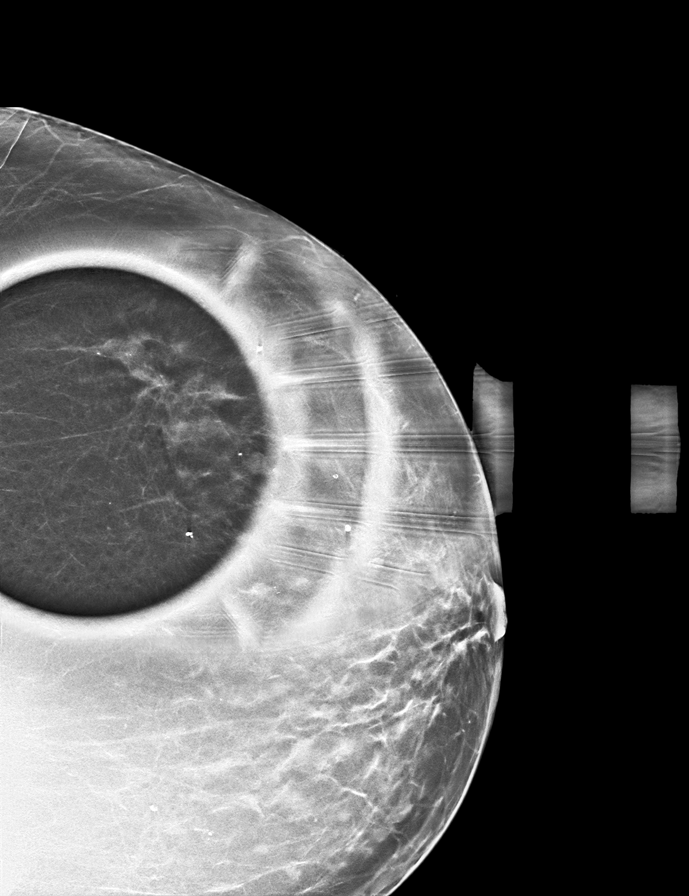

[L MLO (1 of 2)]
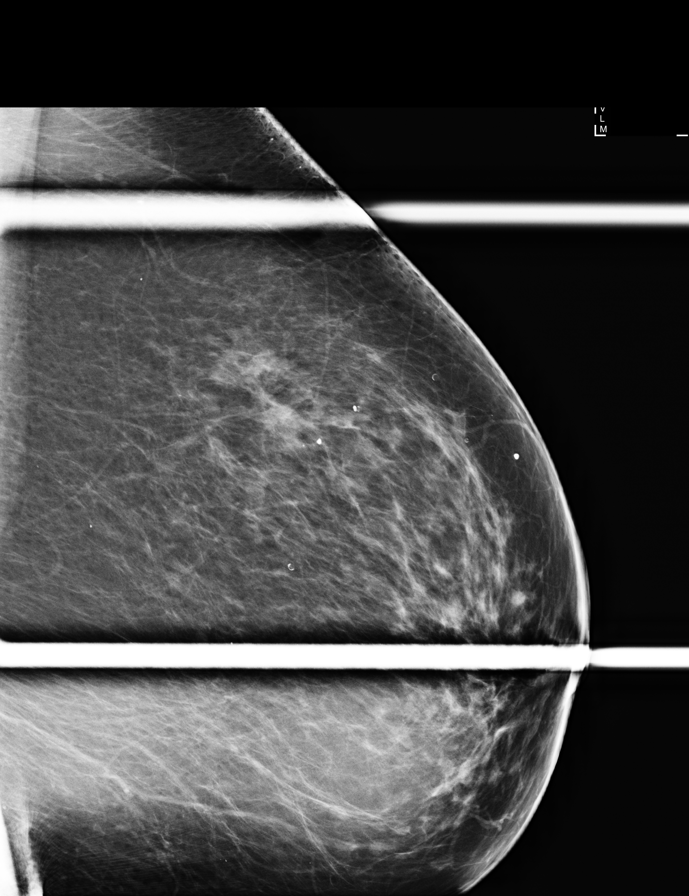

[L MLO synth-2D (1 of 2)]
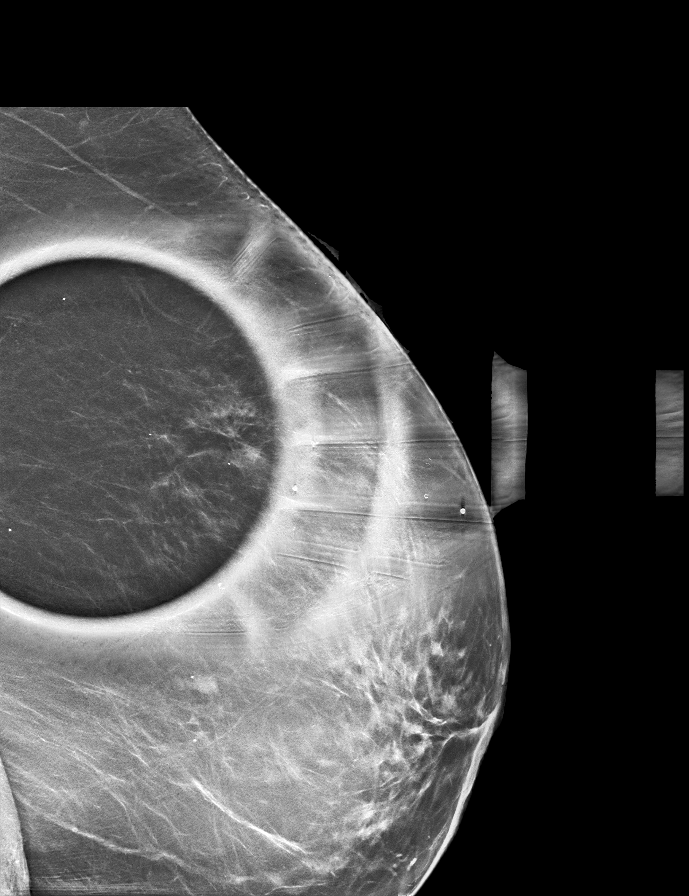

[L CC (1 of 2)]
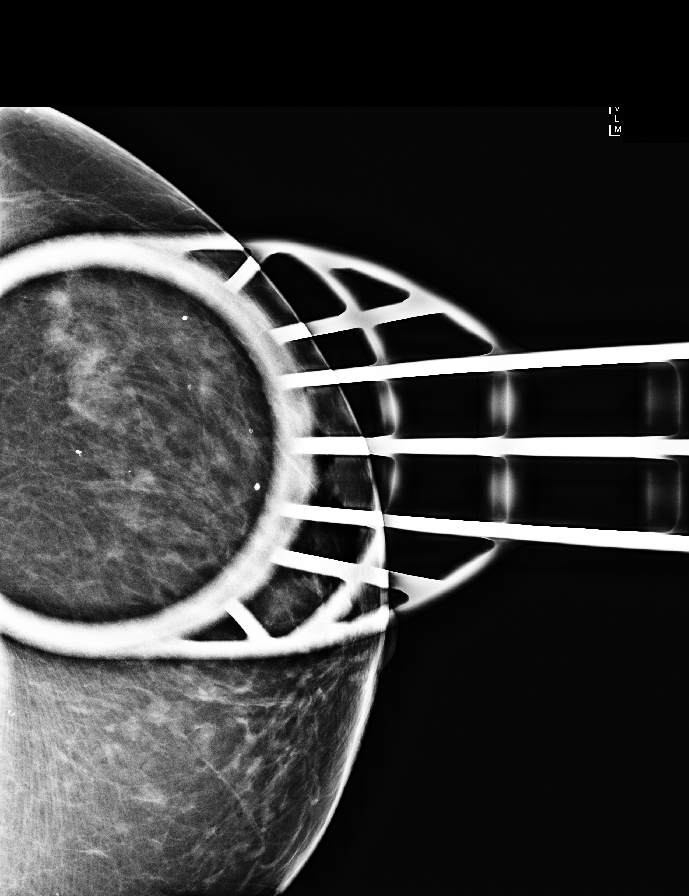

[L MLO (2 of 2)]
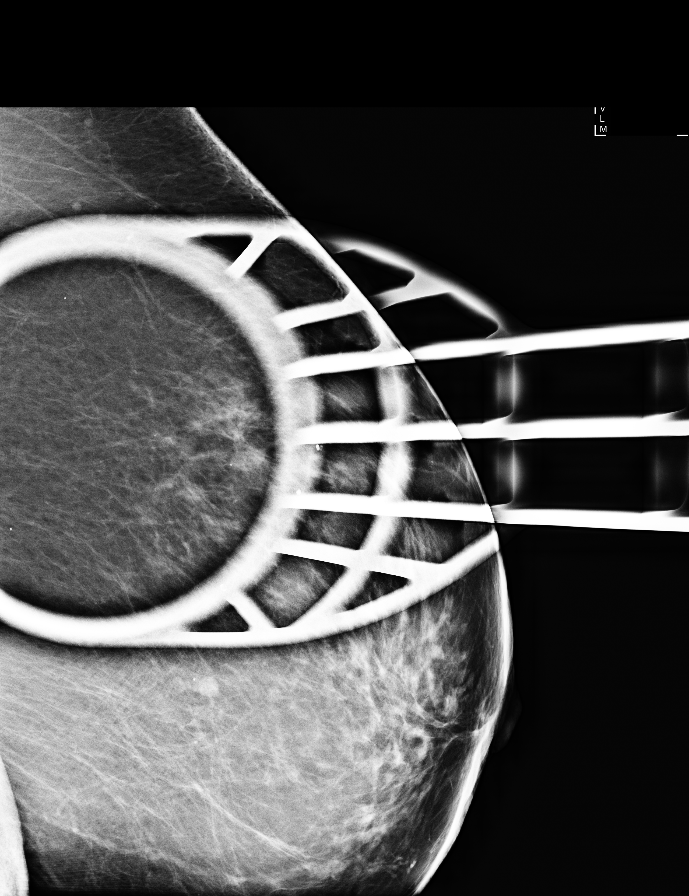

[L MLO synth-2D (2 of 2)]
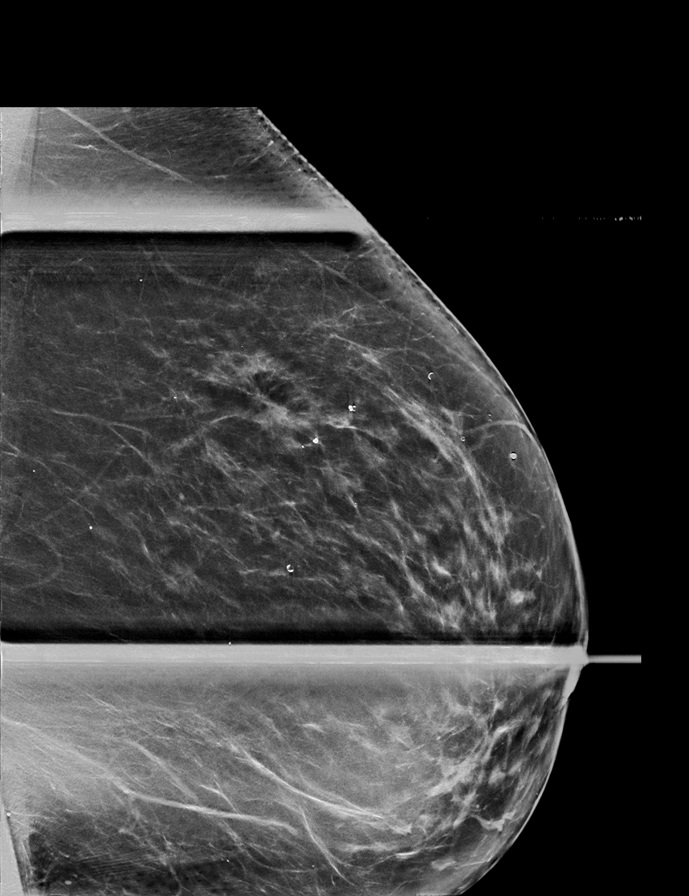

[L CC (2 of 2)]
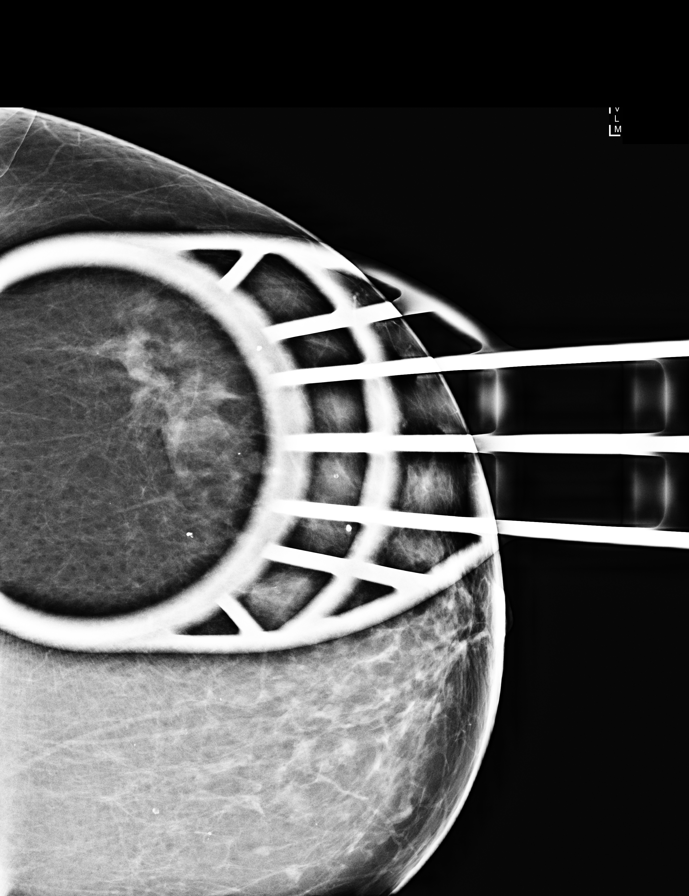

[8 of 28 positions shown; findings below may reference images not displayed]

ACR Breast Density Category c: The breast tissue is heterogeneously
dense, which may obscure small masses.
FINDINGS: On today's additional diagnostic exam with spot compression and 3D
tomosynthesis, the fibroglandular pattern within the upper-outer
quadrant appears stable compared to multiple prior mammograms dating
back to [40] indicating benignity.

An oval circumscribed mass is confirmed within the outer left
breast, 3-4 o'clock axis region, at middle depth, measuring
approximately 5 mm greatest dimension.

Mammographic images were processed with CAD.

Targeted ultrasound is performed, showing a benign cyst within the
left breast at the 4 o'clock axis, 3 cm from the nipple, measuring 6
x 5 x 6 mm, corresponding to the mammographic finding. No suspicious
solid or cystic mass is identified in the outer left breast.
IMPRESSION: No evidence of malignancy. Benign cyst within the left breast at the
4 o'clock axis, measuring 6 mm, corresponding to the mammographic
finding.

Patient may return to routine annual bilateral screening mammogram
schedule.

RECOMMENDATION:
Screening mammogram in one year.(Code:[40])

I have discussed the findings and recommendations with the patient.
Results were also provided in writing at the conclusion of the
visit. If applicable, a reminder letter will be sent to the patient
regarding the next appointment.

BI-RADS CATEGORY  2: Benign.

## 2017-04-02 ENCOUNTER — Encounter: Payer: Self-pay | Admitting: Obstetrics and Gynecology

## 2017-04-02 ENCOUNTER — Ambulatory Visit (INDEPENDENT_AMBULATORY_CARE_PROVIDER_SITE_OTHER): Payer: Medicare Other | Admitting: Obstetrics and Gynecology

## 2017-04-02 VITALS — BP 112/56 | HR 71 | Ht 67.0 in | Wt 188.2 lb

## 2017-04-02 DIAGNOSIS — L739 Follicular disorder, unspecified: Secondary | ICD-10-CM

## 2017-04-02 NOTE — Progress Notes (Signed)
area was used to H of less redness but she we will GYN ENCOUNTER NOTE  Subjective:       Terry Kaiser is a 81 y.o. [phone] female is here for gynecologic evaluation of the following issues:  1. Labia cyst  Patient states she noticed a painful spot on her labia about 1 month ago.  At that time she was having pain, bleeding/ discharge from the "painful bump" on her vulva.  She reports the pain was worse upon sitting and especially when sitting with her legs crossed.  She denies any fevers or chills any past  history of cysts, sexually transmitted diseases or irregular PAP smears. She denies dysuria and is not sexually active at this time.  Her PCP prescribed a 10 day course of doxycycline and her symptoms improved dramatically.   After finishing the 10 abx treatment she reports very minimal discomfort and no continued bleeding or discharge.   Gynecologic History No LMP recorded. Patient is postmenopausal. Contraception: post menopausal status  Obstetric History OB History  Gravida Para Term Preterm AB Living  4 4 4     3   SAB TAB Ectopic Multiple Live Births          3    # Outcome Date GA Lbr Len/2nd Weight Sex Delivery Anes PTL Lv  4 Term 1968    M Vag-Spont   LIV  3 Term 33    M Vag-Spont   LIV  2 Term 1964    F Vag-Spont     1 Term 1963    F Vag-Spont   LIV      Past Medical History:  Diagnosis Date  . Borderline diabetes mellitus 06/22/15   A1c 6.1%; diet controlled  . Colon polyps   . Diverticulosis   . GERD (gastroesophageal reflux disease)    without esophagitis  . HOH (hard of hearing)    aids  . Hypertension   . Pure hypercholesterolemia     Past Surgical History:  Procedure Laterality Date  . BREAST CYST ASPIRATION Left 1992   benign  . BREAST EXCISIONAL BIOPSY Right 2000   benign  . CATARACT EXTRACTION W/PHACO Left 02/21/2015   Procedure: CATARACT EXTRACTION PHACO AND INTRAOCULAR LENS PLACEMENT (IOC);  Surgeon: Terry Cotta, MD;  Location: [facility]  ORS;  Service: Ophthalmology;  Laterality: Left;   00:59 AP% 25.2 CDE 24.66  . COLONOSCOPY WITH PROPOFOL N/A 12/22/2015   Procedure: COLONOSCOPY WITH PROPOFOL;  Surgeon: Terry Silvas, MD;  Location: [facility] ENDOSCOPY;  Service: Endoscopy;  Laterality: N/A;  . DILATION AND CURETTAGE OF UTERUS      Current Outpatient Prescriptions on File Prior to Visit  Medication Sig Dispense Refill  . amLODipine (NORVASC) 2.5 MG tablet Take by mouth daily.    Marland Kitchen aspirin EC 81 MG tablet Take 81 mg by mouth daily.    Marland Kitchen atorvastatin (LIPITOR) 10 MG tablet Take by mouth daily.    [name] by Does not apply route.    . enalapril (VASOTEC) 10 MG tablet Take by mouth daily.    . hydrochlorothiazide (HYDRODIURIL) 25 MG tablet Take 25 mg by mouth daily.    . metoprolol succinate (TOPROL-XL) 100 MG 24 hr tablet Take by mouth 2 (two) times daily. Take with or immediately following a meal.    . pantoprazole (PROTONIX) 20 MG tablet Take by mouth daily.    . potassium chloride (K-DUR,KLOR-CON) 10 MEQ tablet Take by mouth.    . vitamin C (ASCORBIC ACID)  500 MG tablet Take 500 mg by mouth daily.    . vitamin E 400 UNIT capsule Take 400 Units by mouth daily.     No current facility-administered medications on file prior to visit.     No Known Allergies  Social History   Social History  . Marital status: Married    Spouse name: N/A  . Number of children: N/A  . Years of education: N/A   Occupational History  . Not on file.   Social History Main Topics  . Smoking status: Former Research scientist (life sciences)  . Smokeless tobacco: Never Used  . Alcohol use No  . Drug use: No  . Sexual activity: Not Currently    Birth control/ protection: Post-menopausal   Other Topics Concern  . Not on file   Social History Narrative  . No narrative on file    Family History  Problem Relation Age of Onset  . Breast cancer Maternal Aunt 60    The following portions of the patient's history were reviewed and updated as  appropriate: allergies, current medications, past family history, past medical history, past social history, past surgical history and problem list.  Review of Systems See HPI for pertinent ROS Objective:   BP (!) 112/56   Pulse 71   Ht 5\' 7"  (1.702 m)   Wt 188 lb 3.2 oz (85.4 kg)   BMI 29.48 kg/m  CONSTITUTIONAL: Well-developed, well-nourished female in no acute distress.  HENT:  Normocephalic, atraumatic.  NECK: Normal range of motion, supple, no masses.  Normal thyroid.  SKIN: Skin is warm and dry. No rash noted. Not diaphoretic. No erythema. No pallor. [place]: Alert and oriented to person, place, and time. PSYCHIATRIC: Normal mood and affect. Normal behavior. Normal judgment and thought content. CARDIOVASCULAR:Not Examined RESPIRATORY: Not Examined BREASTS: Not Examined ABDOMEN: Soft, non distended; Non tender.  No Organomegaly. PELVIC:  External Genitalia: Normal, no signs of pathology or infection.  No cyst or folliculitis noted.  BUS: Normal  Vagina: Normal; minimal atrophy  Cervix: Not examined  Uterus: Not examined  Adnexa: Not examined  RV: Not examined  Bladder: Not examined MUSCULOSKELETAL: Normal range of motion. No tenderness.  No cyanosis, clubbing, or edema.     Assessment:   1. Perineal folliculitis- resolved.  No signs of infection or pathology.    Plan:   Follow up PRN  A total of 15 minutes were spent face-to-face with the patient during this encounter and over half of that time dealt with counseling and coordination of care.  Terry Qualia, PA-S Terry Mars, MD   I have seen, interviewed, and examined the patient in conjunction with the [facility].Terry Kaiser and affirm the diagnosis and management plan. Terry Kaiser A. Terry Vittorio, MD, FACOG   Note: This dictation was prepared with Dragon dictation along with smaller phrase technology. Any transcriptional errors that result from this process are unintentional.

## 2017-04-02 NOTE — Patient Instructions (Signed)
1. Vulvar issue has resolved. No clinical evidence of infection or pathology 2. Return as needed for any gynecologic issues

## 2017-05-25 IMAGING — US US BREAST*L* LIMITED INC AXILLA
1 series · 5 of 5 positions shown · non-contrast
Comparison: Previous exams including recent screening mammogram
dated [DATE].

CLINICAL DATA: Patient returns today to evaluate a possible left
breast distortion and a possible left breast mass, both identified
on recent screening mammogram.

EXAM:
2D DIGITAL DIAGNOSTIC LEFT MAMMOGRAM WITH CAD AND ADJUNCT TOMO
ULTRASOUND LEFT BREAST

[Series 1: us breast*left* limited inc axilla · 0.06mm/px · 5 of 5 slices shown]
[im 1/5]
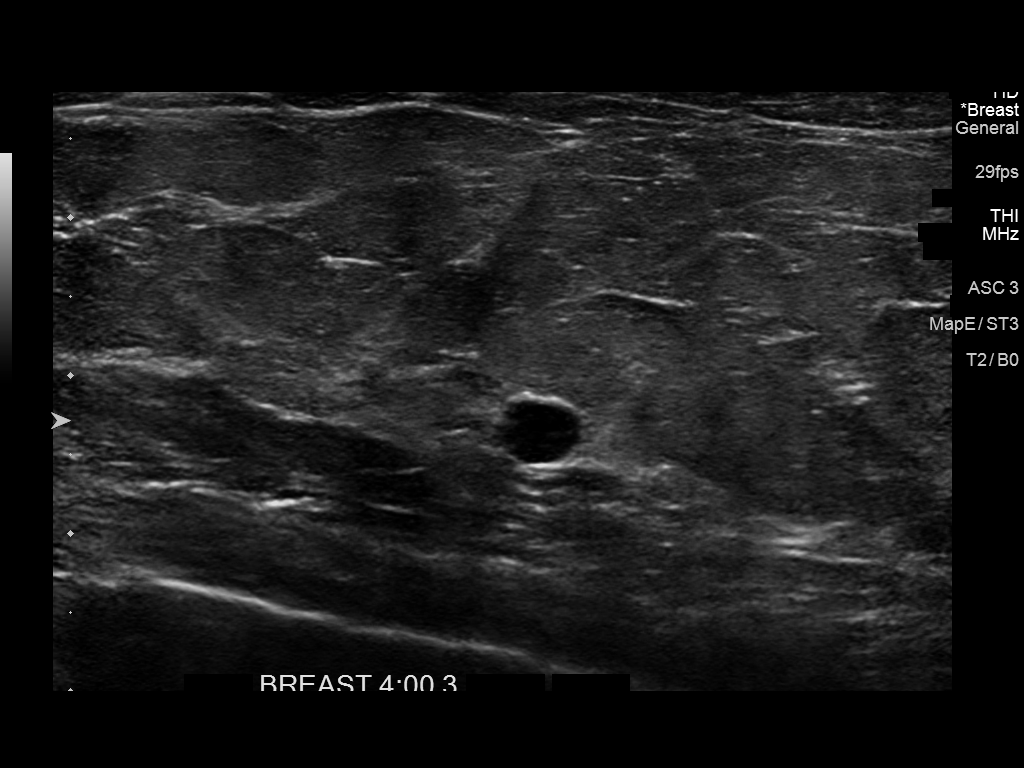
[im 2/5]
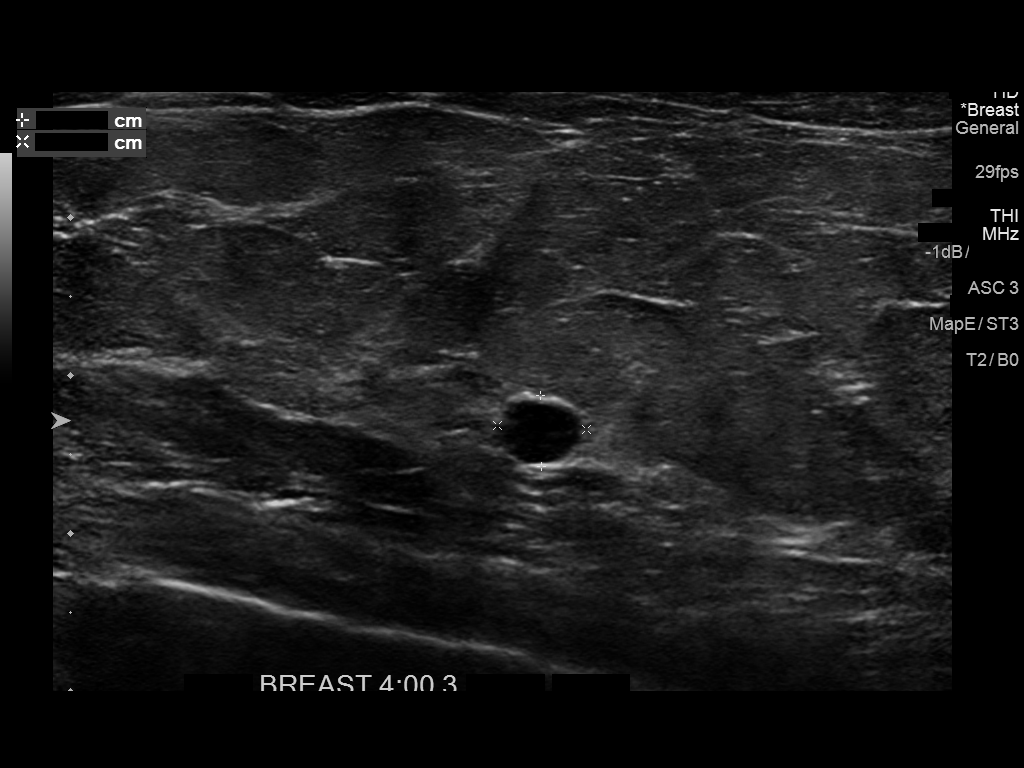
[im 3/5]
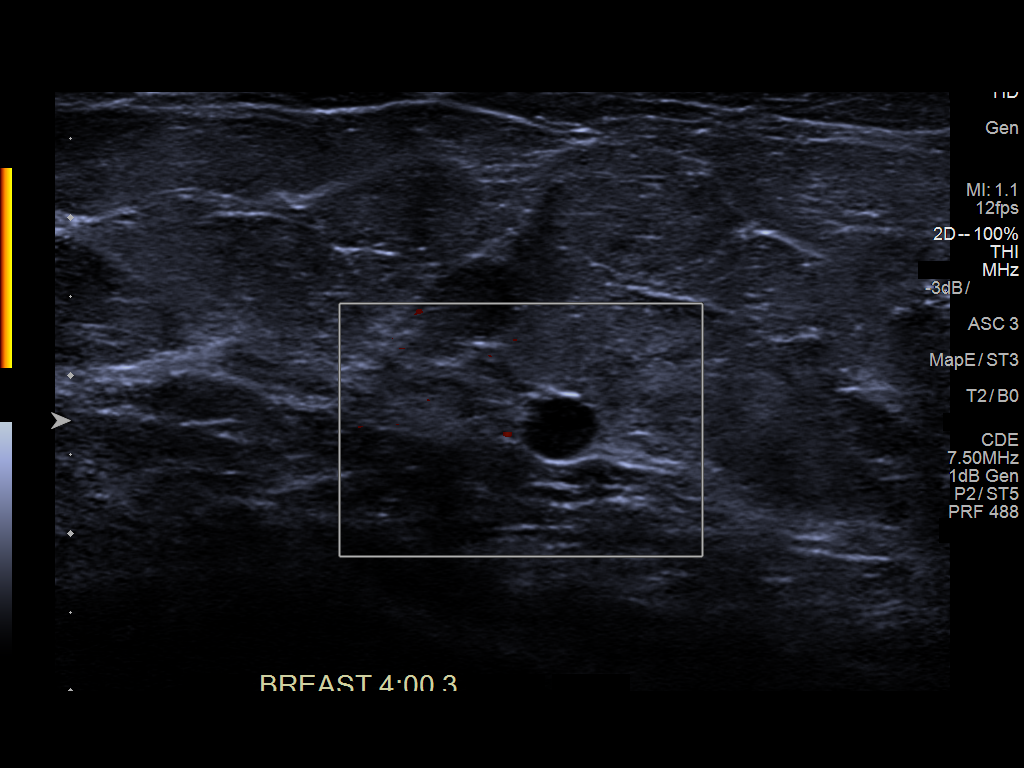
[im 4/5]
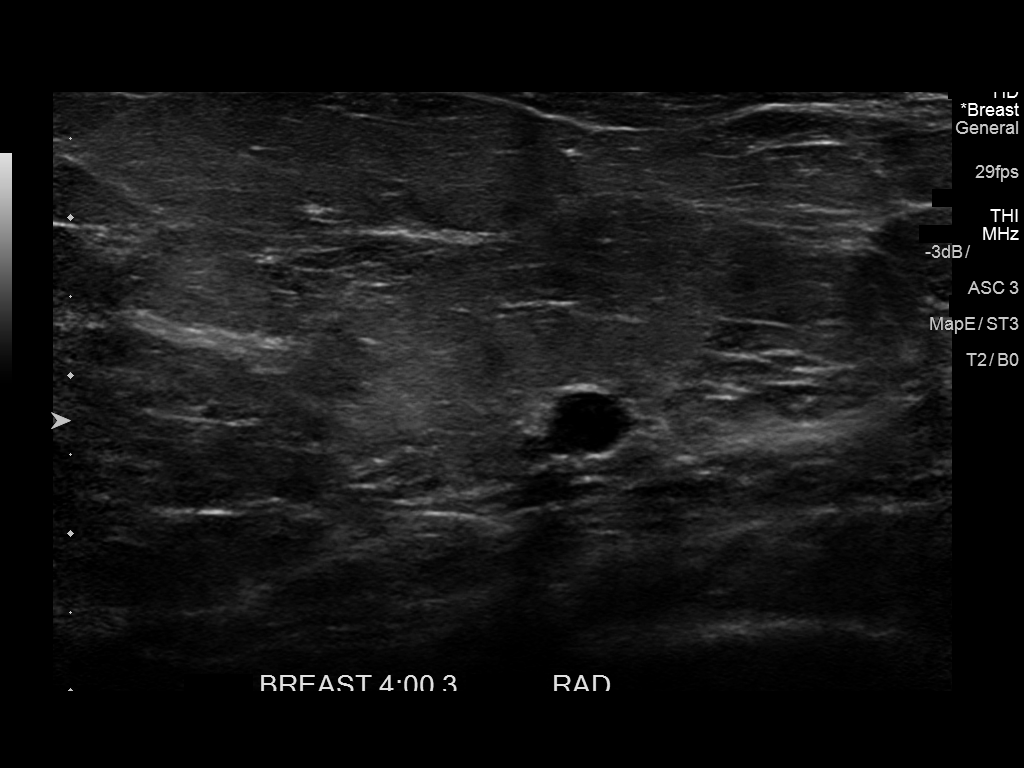
[im 5/5]
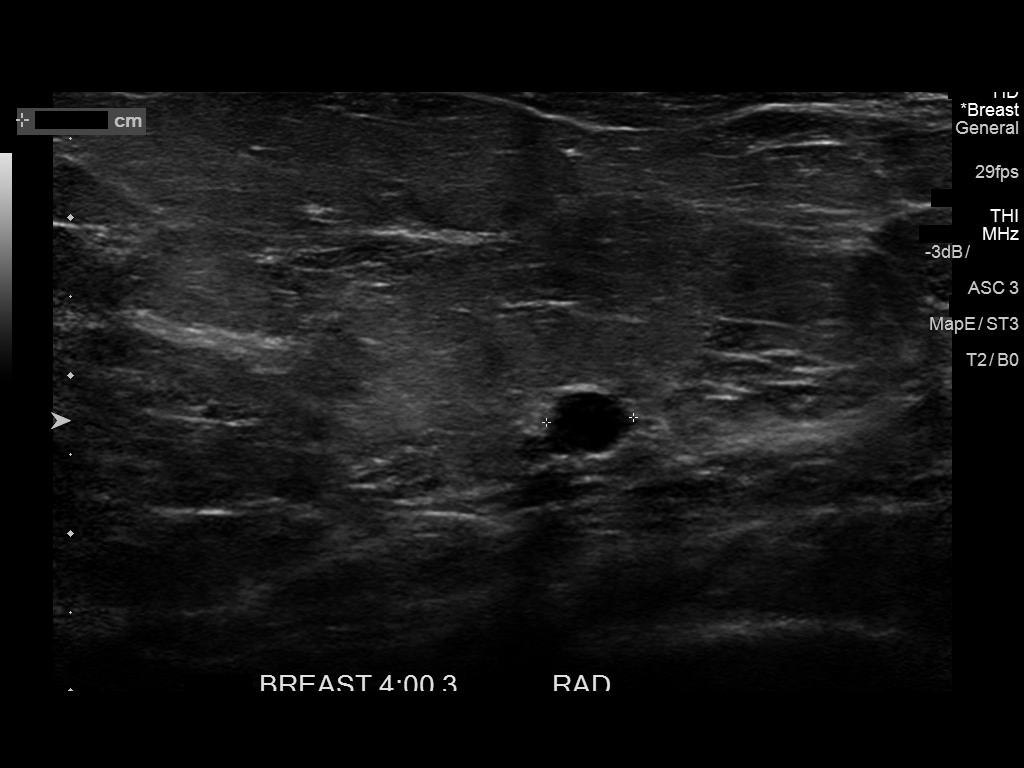

[5 of 5 positions shown; findings below may reference images not displayed]

ACR Breast Density Category c: The breast tissue is heterogeneously
dense, which may obscure small masses.
FINDINGS: On today's additional diagnostic exam with spot compression and 3D
tomosynthesis, the fibroglandular pattern within the upper-outer
quadrant appears stable compared to multiple prior mammograms dating
back to [40] indicating benignity.

An oval circumscribed mass is confirmed within the outer left
breast, 3-4 o'clock axis region, at middle depth, measuring
approximately 5 mm greatest dimension.

Mammographic images were processed with CAD.

Targeted ultrasound is performed, showing a benign cyst within the
left breast at the 4 o'clock axis, 3 cm from the nipple, measuring 6
x 5 x 6 mm, corresponding to the mammographic finding. No suspicious
solid or cystic mass is identified in the outer left breast.
IMPRESSION: No evidence of malignancy. Benign cyst within the left breast at the
4 o'clock axis, measuring 6 mm, corresponding to the mammographic
finding.

Patient may return to routine annual bilateral screening mammogram
schedule.

RECOMMENDATION:
Screening mammogram in one year.(Code:[40])

I have discussed the findings and recommendations with the patient.
Results were also provided in writing at the conclusion of the
visit. If applicable, a reminder letter will be sent to the patient
regarding the next appointment.

BI-RADS CATEGORY  2: Benign.

## 2018-02-20 ENCOUNTER — Other Ambulatory Visit: Payer: Self-pay

## 2018-02-20 NOTE — Discharge Instructions (Signed)

## 2018-02-26 ENCOUNTER — Ambulatory Visit: Payer: Medicare Other | Admitting: Anesthesiology

## 2018-02-26 ENCOUNTER — Encounter
Admission: RE | Disposition: A | Discharge status: Home / Self Care | Payer: Self-pay | Source: Ambulatory Visit | Attending: Ophthalmology

## 2018-02-26 ENCOUNTER — Ambulatory Visit
Admission: RE | Admit: 2018-02-26 | Discharge: 2018-02-26 | Disposition: A | Discharge status: Home / Self Care | Payer: Medicare Other | Source: Ambulatory Visit | Attending: Ophthalmology | Admitting: Ophthalmology

## 2018-02-26 DIAGNOSIS — H2511 Age-related nuclear cataract, right eye: Secondary | ICD-10-CM | POA: Diagnosis present

## 2018-02-26 DIAGNOSIS — K5790 Diverticulosis of intestine, part unspecified, without perforation or abscess without bleeding: Secondary | ICD-10-CM | POA: Diagnosis not present

## 2018-02-26 DIAGNOSIS — I10 Essential (primary) hypertension: Secondary | ICD-10-CM | POA: Diagnosis not present

## 2018-02-26 DIAGNOSIS — E1136 Type 2 diabetes mellitus with diabetic cataract: Secondary | ICD-10-CM | POA: Insufficient documentation

## 2018-02-26 DIAGNOSIS — K219 Gastro-esophageal reflux disease without esophagitis: Secondary | ICD-10-CM | POA: Insufficient documentation

## 2018-02-26 DIAGNOSIS — Z87891 Personal history of nicotine dependence: Secondary | ICD-10-CM | POA: Diagnosis not present

## 2018-02-26 DIAGNOSIS — E7800 Pure hypercholesterolemia, unspecified: Secondary | ICD-10-CM | POA: Insufficient documentation

## 2018-02-26 DIAGNOSIS — H9193 Unspecified hearing loss, bilateral: Secondary | ICD-10-CM | POA: Insufficient documentation

## 2018-02-26 DIAGNOSIS — R0683 Snoring: Secondary | ICD-10-CM | POA: Diagnosis not present

## 2018-02-26 DIAGNOSIS — M1990 Unspecified osteoarthritis, unspecified site: Secondary | ICD-10-CM | POA: Insufficient documentation

## 2018-02-26 DIAGNOSIS — Z9842 Cataract extraction status, left eye: Secondary | ICD-10-CM | POA: Insufficient documentation

## 2018-02-26 DIAGNOSIS — Z85828 Personal history of other malignant neoplasm of skin: Secondary | ICD-10-CM | POA: Insufficient documentation

## 2018-02-26 HISTORY — PX: CATARACT EXTRACTION W/PHACO: SHX586

## 2018-02-26 HISTORY — DX: Type 2 diabetes mellitus without complications: E11.9

## 2018-02-26 HISTORY — DX: Age-related osteoporosis without current pathological fracture: M81.0

## 2018-02-26 HISTORY — DX: Other seasonal allergic rhinitis: J30.2

## 2018-02-26 HISTORY — DX: Unspecified osteoarthritis, unspecified site: M19.90

## 2018-02-26 HISTORY — DX: Malignant (primary) neoplasm, unspecified: C80.1

## 2018-02-26 SURGERY — PHACOEMULSIFICATION, CATARACT, WITH IOL INSERTION
Anesthesia: Monitor Anesthesia Care | Site: Eye | Laterality: Right | Wound class: "Clean "

## 2018-02-26 MED ORDER — ONDANSETRON HCL 4 MG/2ML IJ SOLN: 4.0000 mg | Freq: Once | INTRAMUSCULAR | Status: DC | PRN

## 2018-02-26 MED ORDER — CEFUROXIME OPHTHALMIC INJECTION 1 MG/0.1 ML
INJECTION | OPHTHALMIC | Status: DC | PRN
  Administered 2018-02-26: .3 mL via OPHTHALMIC

## 2018-02-26 MED ORDER — ACETAMINOPHEN 160 MG/5ML PO SOLN: 325.0000 mg | ORAL | Status: DC | PRN

## 2018-02-26 MED ORDER — EPINEPHRINE PF 1 MG/ML IJ SOLN
INTRAOCULAR | Status: DC | PRN
  Administered 2018-02-26: 65 mL via OPHTHALMIC

## 2018-02-26 MED ORDER — MOXIFLOXACIN HCL 0.5 % OP SOLN
1.0000 [drp] | OPHTHALMIC | Status: DC | PRN
  Administered 2018-02-26 (×3): 1 [drp] via OPHTHALMIC

## 2018-02-26 MED ORDER — NA HYALUR & NA CHOND-NA HYALUR 0.4-0.35 ML IO KIT
PACK | INTRAOCULAR | Status: DC | PRN
  Administered 2018-02-26: 1 mL via INTRAOCULAR

## 2018-02-26 MED ORDER — LACTATED RINGERS IV SOLN: 10.0000 mL/h | INTRAVENOUS | Status: DC

## 2018-02-26 MED ORDER — ACETAMINOPHEN 325 MG PO TABS: 325.0000 mg | ORAL_TABLET | ORAL | Status: DC | PRN

## 2018-02-26 MED ORDER — LIDOCAINE HCL (PF) 2 % IJ SOLN
INTRAOCULAR | Status: DC | PRN
  Administered 2018-02-26: 1 mL via INTRAMUSCULAR

## 2018-02-26 MED ORDER — BRIMONIDINE TARTRATE-TIMOLOL 0.2-0.5 % OP SOLN
OPHTHALMIC | Status: DC | PRN
  Administered 2018-02-26: 1 [drp] via OPHTHALMIC

## 2018-02-26 MED ORDER — FENTANYL CITRATE (PF) 100 MCG/2ML IJ SOLN
INTRAMUSCULAR | Status: DC | PRN
  Administered 2018-02-26: 50 ug via INTRAVENOUS

## 2018-02-26 MED ORDER — ARMC OPHTHALMIC DILATING DROPS
1.0000 "application " | OPHTHALMIC | Status: DC | PRN
  Administered 2018-02-26 (×3): 1 via OPHTHALMIC

## 2018-02-26 MED ORDER — MIDAZOLAM HCL 2 MG/2ML IJ SOLN
INTRAMUSCULAR | Status: DC | PRN
  Administered 2018-02-26: 2 mg via INTRAVENOUS

## 2018-02-26 SURGICAL SUPPLY — 27 items

## 2018-02-26 NOTE — Anesthesia Procedure Notes (Signed)
Procedure Name: MAC Date/Time: 02/26/2018 8:13 AM Performed by: Janna Arch, CRNA Pre-anesthesia Checklist: Patient identified, Emergency Drugs available, Suction available and Patient being monitored Patient Re-evaluated:Patient Re-evaluated prior to induction Oxygen Delivery Method: Nasal cannula

## 2018-02-26 NOTE — H&P (Signed)
The History and Physical notes are on paper, have been signed, and are to be scanned. The patient remains stable and unchanged from the H&P.   Previous H&P reviewed, patient examined, and there are no changes.  Terry Kaiser 02/26/2018 7:43 AM

## 2018-02-26 NOTE — Transfer of Care (Signed)
Immediate Anesthesia Transfer of Care Note  Patient: Terry Kaiser  Procedure(s) Performed: CATARACT EXTRACTION PHACO AND INTRAOCULAR LENS PLACEMENT (IOC)  DIABETIC RIGHT (Right Eye)  Patient Location: PACU  Anesthesia Type: MAC  Level of Consciousness: awake, alert  and patient cooperative  Airway and Oxygen Therapy: Patient Spontanous Breathing and Patient connected to supplemental oxygen  Post-op Assessment: Post-op Vital signs reviewed, Patient's Cardiovascular Status Stable, Respiratory Function Stable, Patent Airway and No signs of Nausea or vomiting  Post-op Vital Signs: Reviewed and stable  Complications: No apparent anesthesia complications

## 2018-02-26 NOTE — Anesthesia Postprocedure Evaluation (Signed)
Anesthesia Post Note  Patient: Terry Kaiser  Procedure(s) Performed: CATARACT EXTRACTION PHACO AND INTRAOCULAR LENS PLACEMENT (IOC)  DIABETIC RIGHT (Right Eye)  Patient location during evaluation: PACU Anesthesia Type: MAC Level of consciousness: awake and alert, oriented and patient cooperative Pain management: pain level controlled Vital Signs Assessment: post-procedure vital signs reviewed and stable Respiratory status: spontaneous breathing, nonlabored ventilation and respiratory function stable Cardiovascular status: blood pressure returned to baseline and stable Postop Assessment: adequate PO intake Anesthetic complications: no    Darrin Nipper

## 2018-02-26 NOTE — Anesthesia Preprocedure Evaluation (Signed)
Anesthesia Evaluation  Patient identified by MRN, date of birth, ID band Patient awake    Reviewed: Allergy & Precautions, NPO status , Patient's Chart, lab work & pertinent test results  History of Anesthesia Complications Negative for: history of anesthetic complications  Airway Mallampati: III  TM Distance: >3 FB Neck ROM: Full    Dental no notable dental hx.    Pulmonary former smoker (quit >30 years ago),  Snoring    Pulmonary exam normal breath sounds clear to auscultation       Cardiovascular Exercise Tolerance: Good hypertension, Normal cardiovascular exam Rhythm:Regular Rate:Normal     Neuro/Psych HOH    GI/Hepatic GERD  ,  Endo/Other  diabetes, Type 2  Renal/GU negative Renal ROS     Musculoskeletal  (+) Arthritis , Osteoarthritis,    Abdominal   Peds  Hematology negative hematology ROS (+)   Anesthesia Other Findings   Reproductive/Obstetrics                             Anesthesia Physical Anesthesia Plan  ASA: II  Anesthesia Plan: MAC   Post-op Pain Management:    Induction: Intravenous  PONV Risk Score and Plan: 2 and TIVA and Midazolam  Airway Management Planned: Natural Airway  Additional Equipment:   Intra-op Plan:   Post-operative Plan:   Informed Consent: I have reviewed the patients History and Physical, chart, labs and discussed the procedure including the risks, benefits and alternatives for the proposed anesthesia with the patient or authorized representative who has indicated his/her understanding and acceptance.     Plan Discussed with: CRNA  Anesthesia Plan Comments:         Anesthesia Quick Evaluation

## 2018-02-26 NOTE — Op Note (Signed)
LOCATION:  [place]   PREOPERATIVE DIAGNOSIS:    Nuclear sclerotic cataract right eye. H25.11   POSTOPERATIVE DIAGNOSIS:  Nuclear sclerotic cataract right eye.     PROCEDURE:  Phacoemusification with posterior chamber intraocular lens placement of the right eye   LENS:   Implant Name Type Inv. Item Serial No. Manufacturer Lot No. LRB No. Used  LENS IOL ACRYSOF IQ 22.5 - [id number] Intraocular Lens LENS IOL ACRYSOF IQ 22.5 [id number] [employer]  Right 1        ULTRASOUND TIME: 18 % of 0 minutes, 53 seconds.  CDE 9.6   SURGEON:  Wyonia Hough, MD   ANESTHESIA:  Topical with tetracaine drops and 2% Xylocaine jelly, augmented with 1% preservative-free intracameral lidocaine.    COMPLICATIONS:  None.   DESCRIPTION OF PROCEDURE:  The patient was identified in the holding room and transported to the operating room and placed in the supine position under the operating microscope.  The right eye was identified as the operative eye and it was prepped and draped in the usual sterile ophthalmic fashion.   A 1 millimeter clear-corneal paracentesis was made at the 12:00 position.  0.5 ml of preservative-free 1% lidocaine was injected into the anterior chamber. The anterior chamber was filled with Viscoat viscoelastic.  A 2.4 millimeter keratome was used to make a near-clear corneal incision at the 9:00 position.  A curvilinear capsulorrhexis was made with a cystotome and capsulorrhexis forceps.  Balanced salt solution was used to hydrodissect and hydrodelineate the nucleus.   Phacoemulsification was then used in stop and chop fashion to remove the lens nucleus and epinucleus.  The remaining cortex was then removed using the irrigation and aspiration handpiece. Provisc was then placed into the capsular bag to distend it for lens placement.  A lens was then injected into the capsular bag.  The remaining viscoelastic was aspirated.   Wounds were hydrated with balanced salt solution.   The anterior chamber was inflated to a physiologic pressure with balanced salt solution.  No wound leaks were noted. Cefuroxime 0.1 ml of a 10mg /ml solution was injected into the anterior chamber for a dose of 1 mg of intracameral antibiotic at the completion of the case.   Timolol and Brimonidine drops were applied to the eye.  The patient was taken to the recovery room in stable condition without complications of anesthesia or surgery.   Terry Kaiser 02/26/2018, 8:28 AM

## 2018-02-27 ENCOUNTER — Encounter: Payer: Self-pay | Admitting: Ophthalmology

## 2018-03-05 ENCOUNTER — Other Ambulatory Visit: Payer: Self-pay | Admitting: Family Medicine

## 2018-03-05 DIAGNOSIS — Z1231 Encounter for screening mammogram for malignant neoplasm of breast: Secondary | ICD-10-CM

## 2018-03-20 ENCOUNTER — Ambulatory Visit
Admission: RE | Admit: 2018-03-20 | Discharge: 2018-03-20 | Disposition: A | Discharge status: Home / Self Care | Payer: Medicare Other | Source: Ambulatory Visit | Attending: Family Medicine | Admitting: Family Medicine

## 2018-03-20 DIAGNOSIS — Z1231 Encounter for screening mammogram for malignant neoplasm of breast: Secondary | ICD-10-CM | POA: Diagnosis present

## 2018-03-20 IMAGING — MG MM DIGITAL SCREENING BILAT W/ TOMO W/ CAD
8 series · 8 of 24 positions shown · non-contrast
Comparison: Previous exam(s).

CLINICAL DATA: Screening.

EXAM:
DIGITAL SCREENING BILATERAL MAMMOGRAM WITH TOMO AND CAD

[L MLO synth-2D]
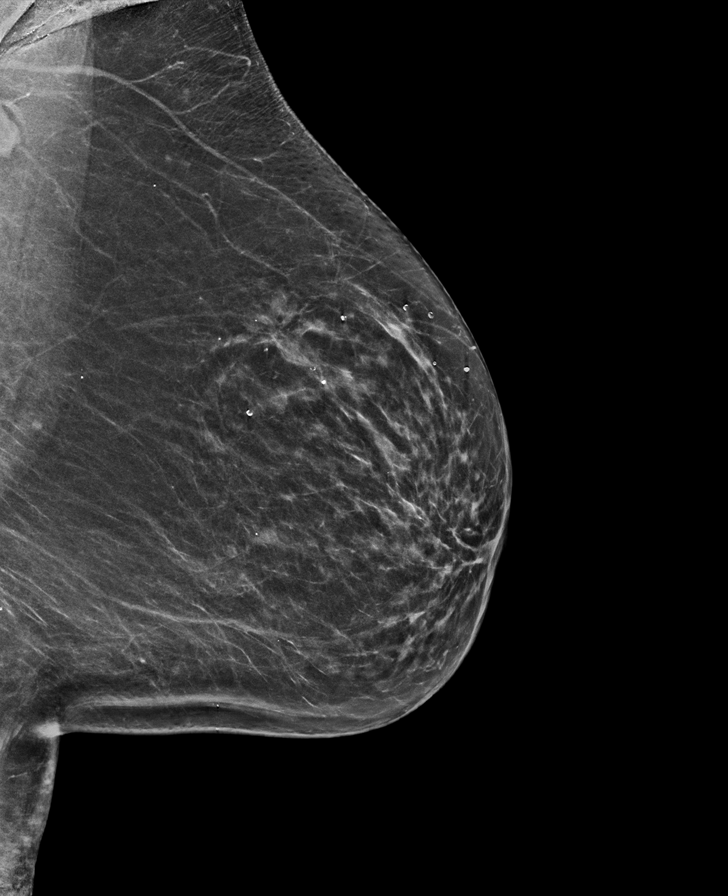

[L CC synth-2D]
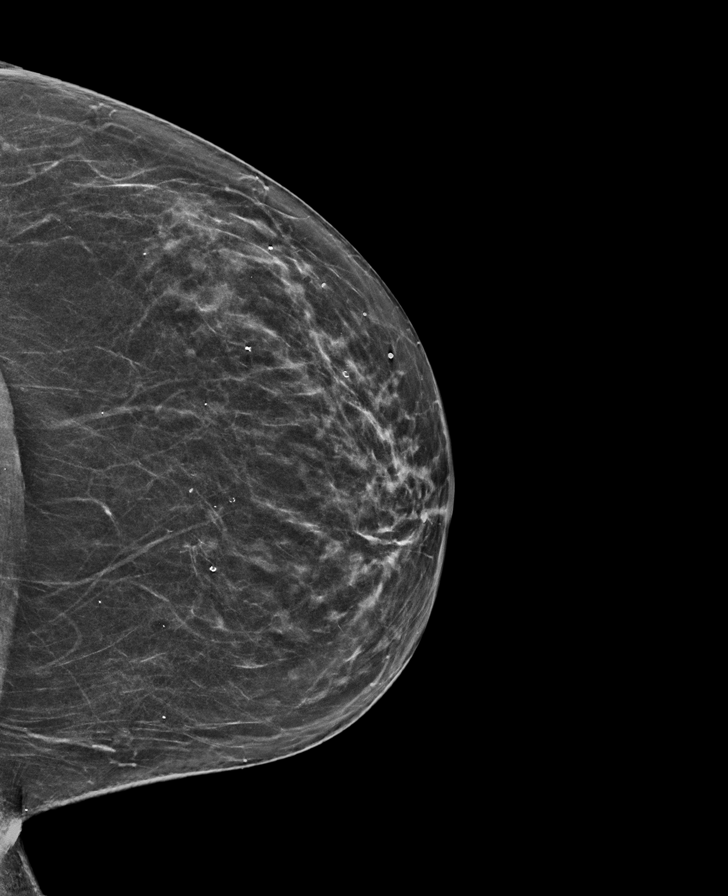

[R CC synth-2D]
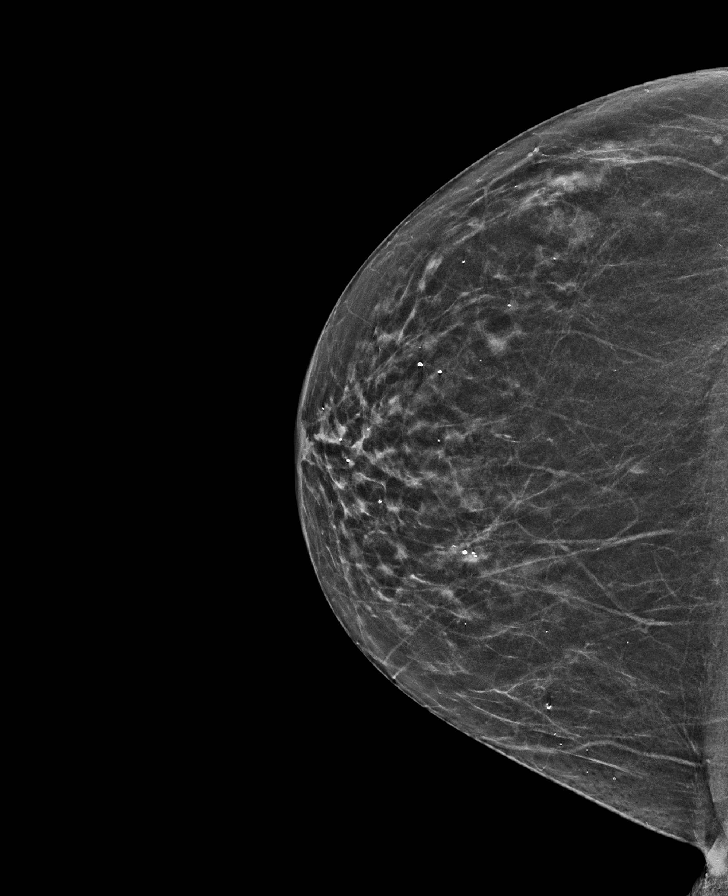

[R MLO synth-2D]
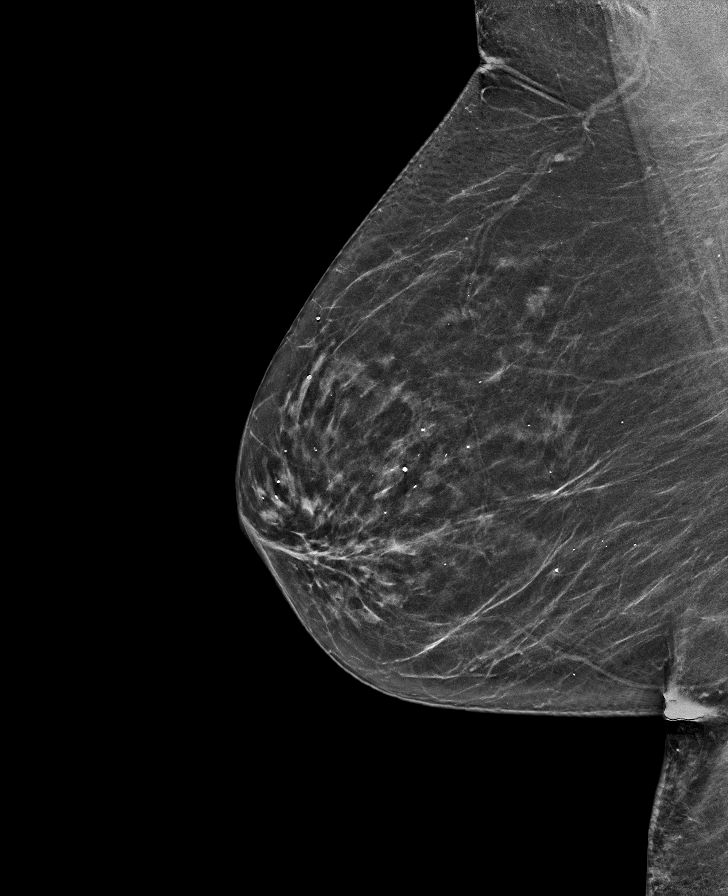

[R MLO tomo · tomo slice 31/61.0]
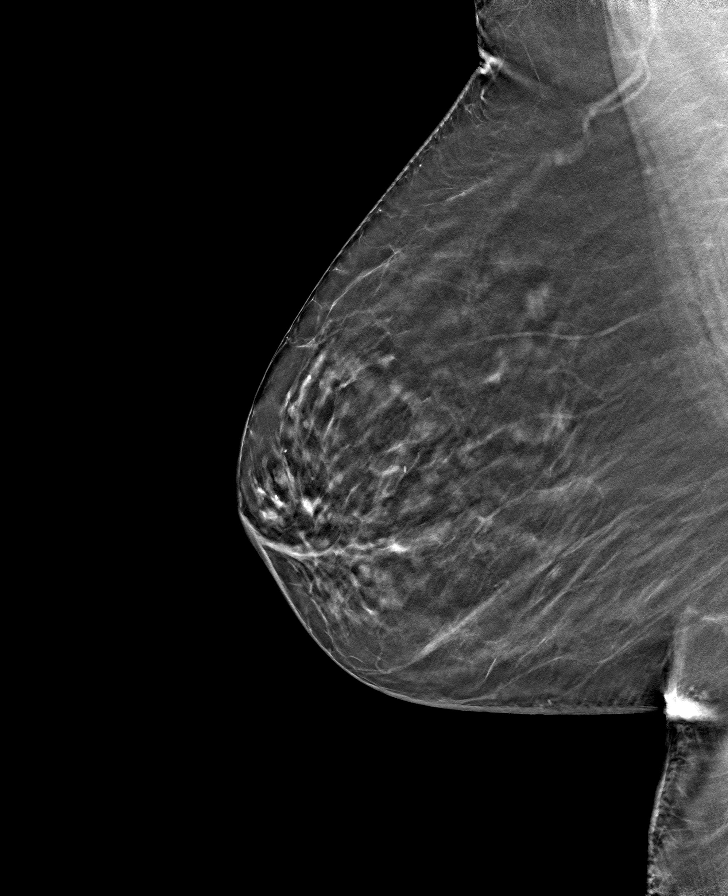

[R CC tomo · tomo slice 27/52.0]
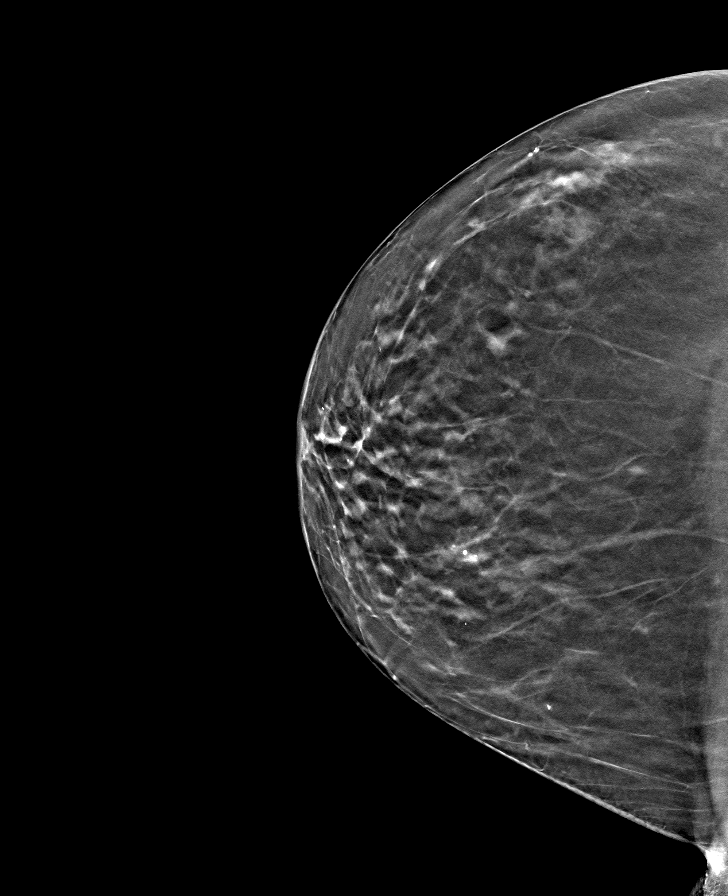

[L CC tomo · tomo slice 29/58.0]
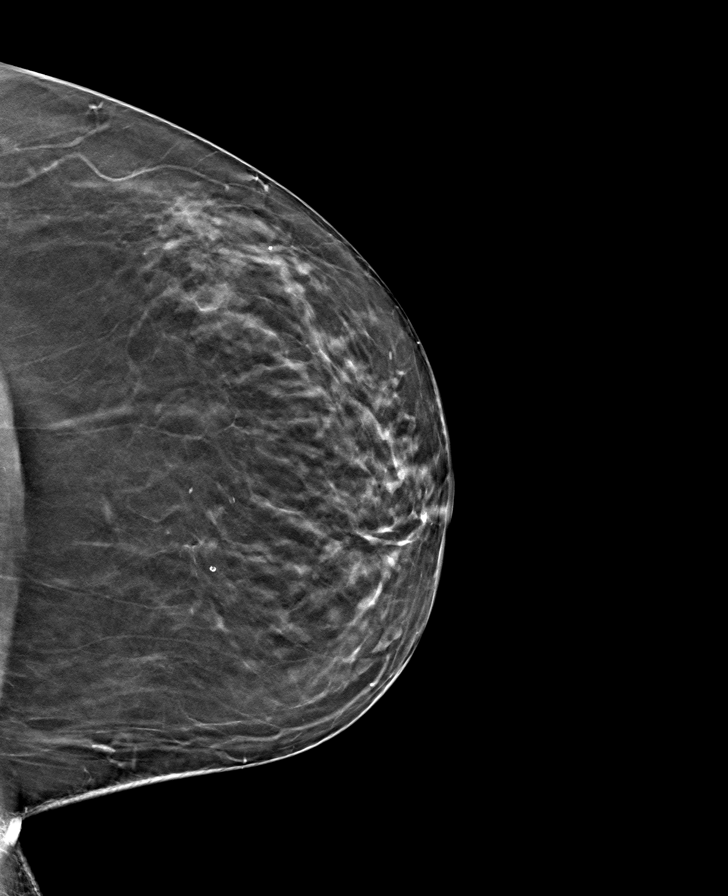

[L MLO tomo · tomo slice 33/64.0]
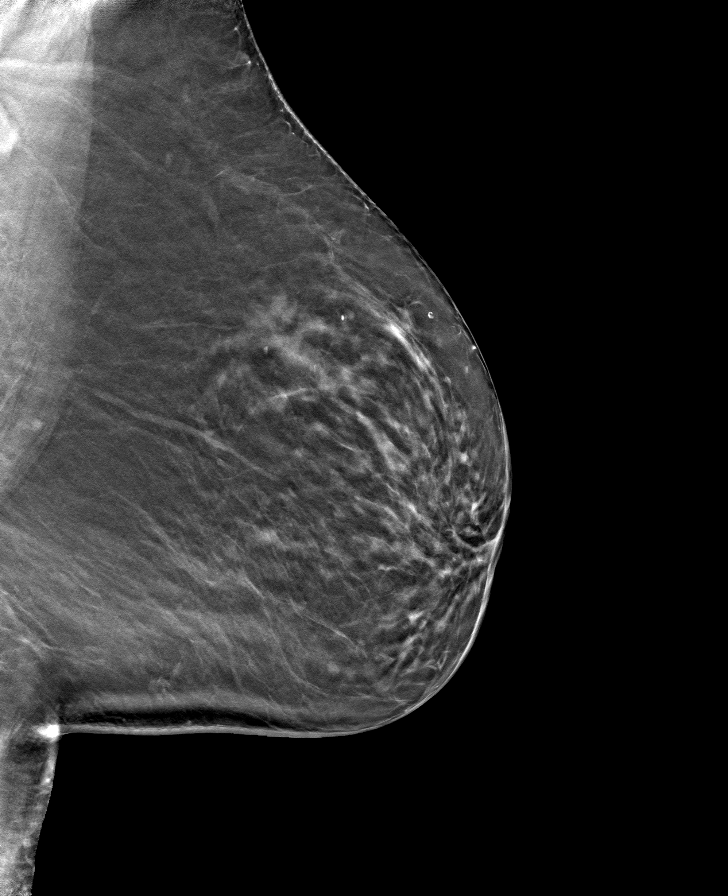

[8 of 24 positions shown; findings below may reference images not displayed]

ACR Breast Density Category b: There are scattered areas of
fibroglandular density.
FINDINGS: There are no findings suspicious for malignancy. Images were
processed with CAD.
IMPRESSION: No mammographic evidence of malignancy. A result letter of this
screening mammogram will be mailed directly to the patient.

RECOMMENDATION:
Screening mammogram in one year. (Code:[TQ])

BI-RADS CATEGORY  1: Negative.

## 2018-07-01 DIAGNOSIS — Z66 Do not resuscitate: Secondary | ICD-10-CM | POA: Diagnosis present

## 2018-07-01 DIAGNOSIS — Z862 Personal history of diseases of the blood and blood-forming organs and certain disorders involving the immune mechanism: Secondary | ICD-10-CM

## 2019-02-25 ENCOUNTER — Other Ambulatory Visit: Payer: Self-pay | Admitting: Family Medicine

## 2019-02-25 DIAGNOSIS — Z1231 Encounter for screening mammogram for malignant neoplasm of breast: Secondary | ICD-10-CM

## 2019-03-23 ENCOUNTER — Ambulatory Visit
Admission: RE | Admit: 2019-03-23 | Discharge: 2019-03-23 | Disposition: A | Discharge status: Home / Self Care | Payer: Medicare Other | Source: Ambulatory Visit | Attending: Family Medicine | Admitting: Family Medicine

## 2019-03-23 ENCOUNTER — Other Ambulatory Visit: Payer: Self-pay

## 2019-03-23 DIAGNOSIS — Z1231 Encounter for screening mammogram for malignant neoplasm of breast: Secondary | ICD-10-CM | POA: Insufficient documentation

## 2019-03-23 IMAGING — MG DIGITAL SCREENING BILATERAL MAMMOGRAM WITH TOMO AND CAD
8 series · 8 of 24 positions shown · non-contrast
Comparison: Previous exam(s).

CLINICAL DATA: Screening.

EXAM:
DIGITAL SCREENING BILATERAL MAMMOGRAM WITH TOMO AND CAD

[R CC synth-2D]
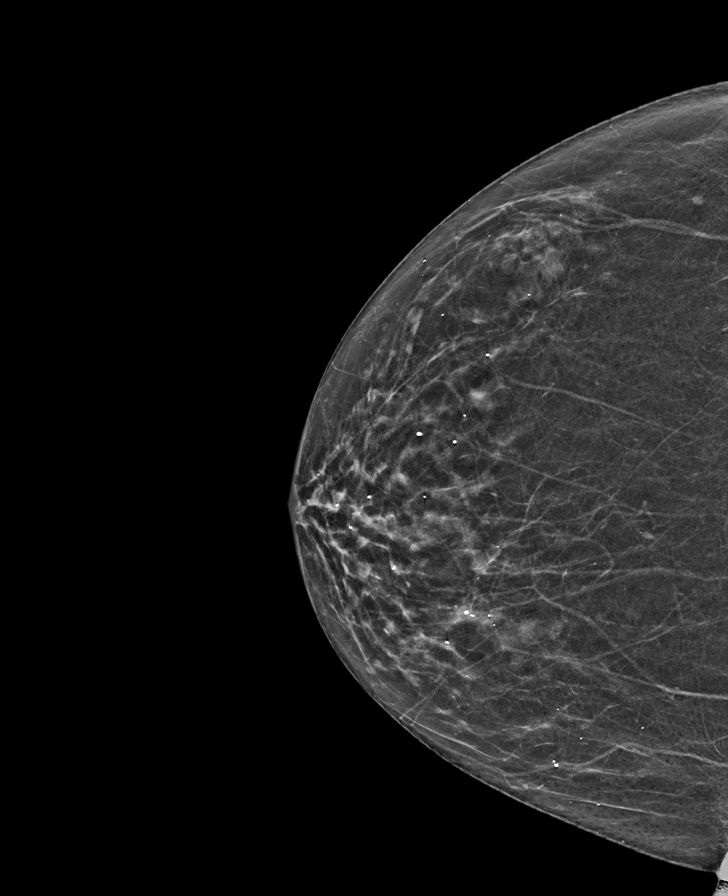

[R MLO synth-2D]
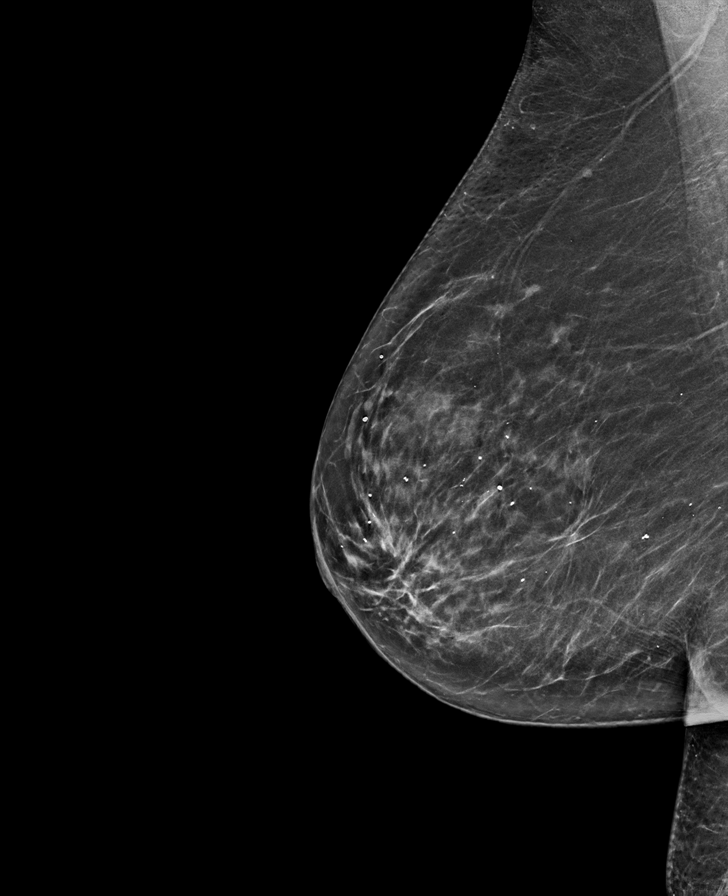

[L CC synth-2D]
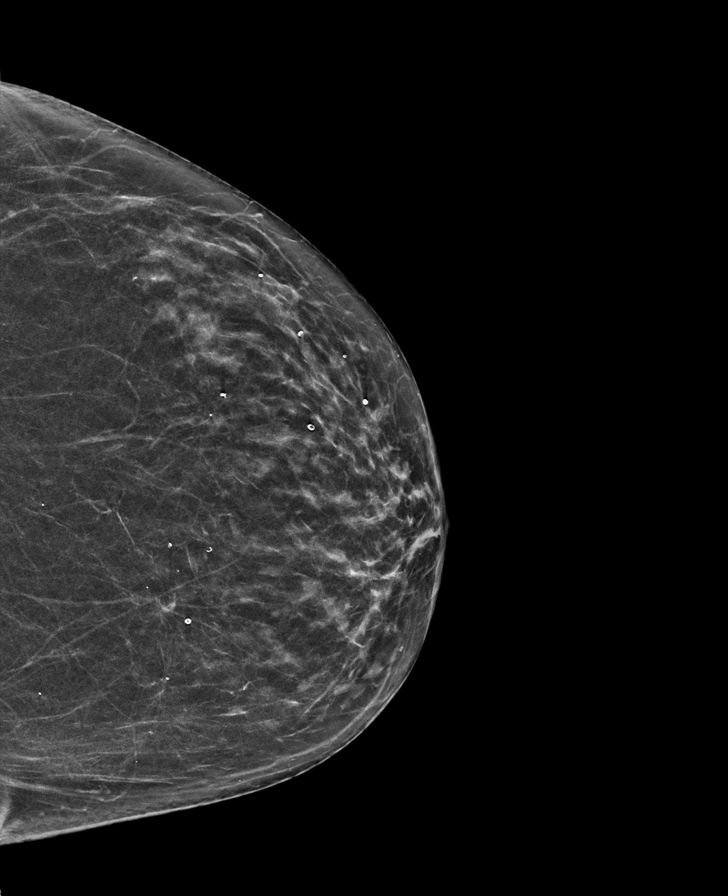

[L MLO synth-2D]
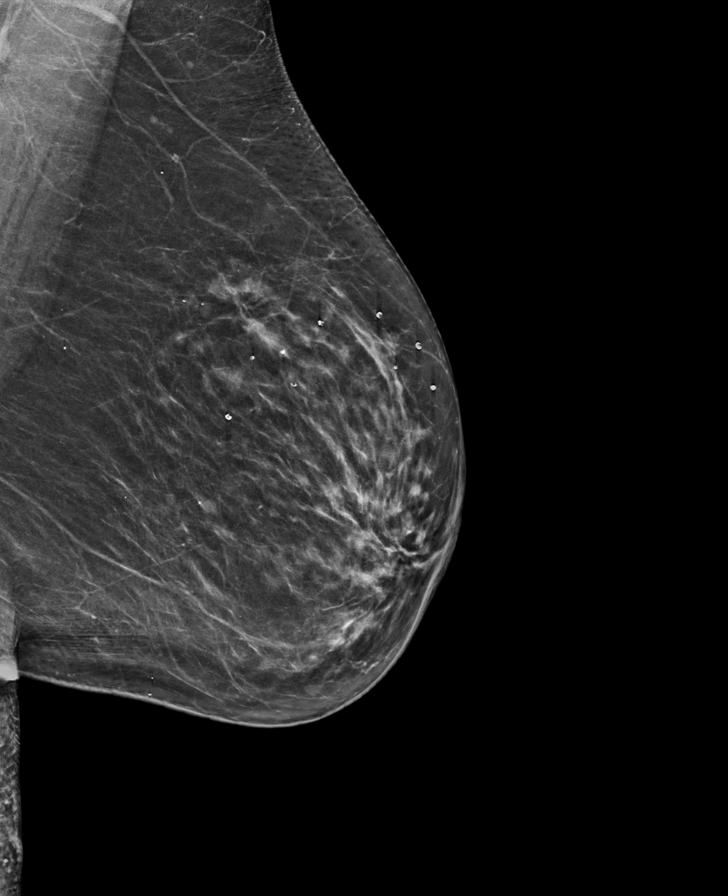

[R MLO tomo · tomo slice 33/66.0]
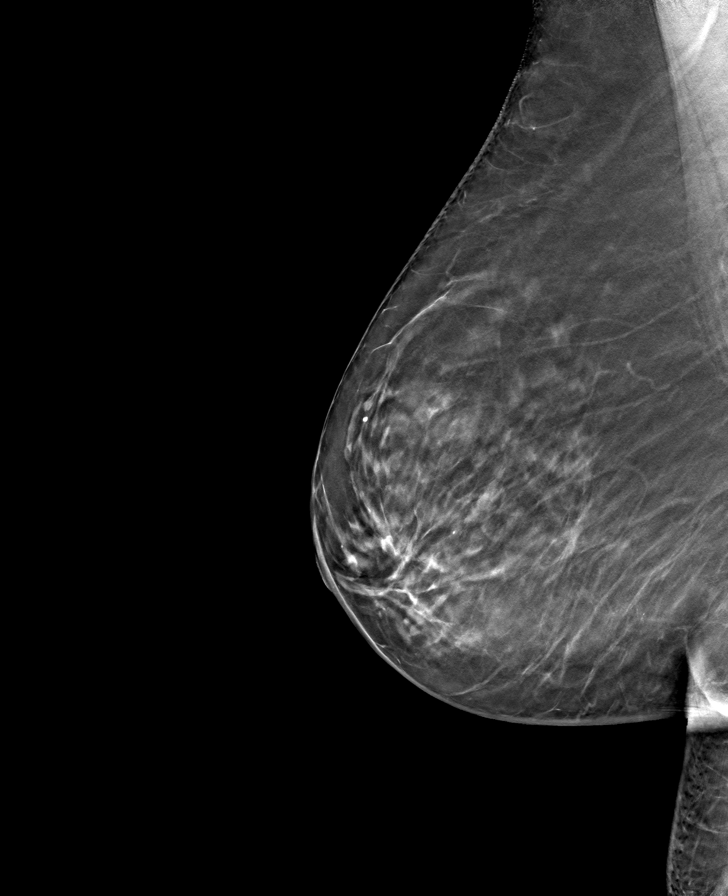

[L CC tomo · tomo slice 29/57.0]
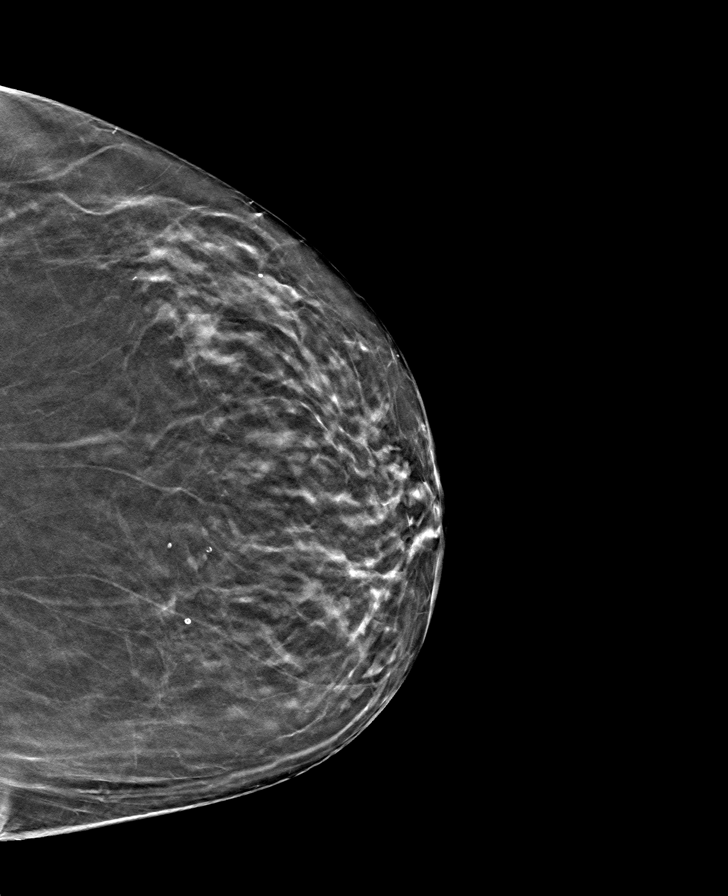

[L MLO tomo · tomo slice 33/66.0]
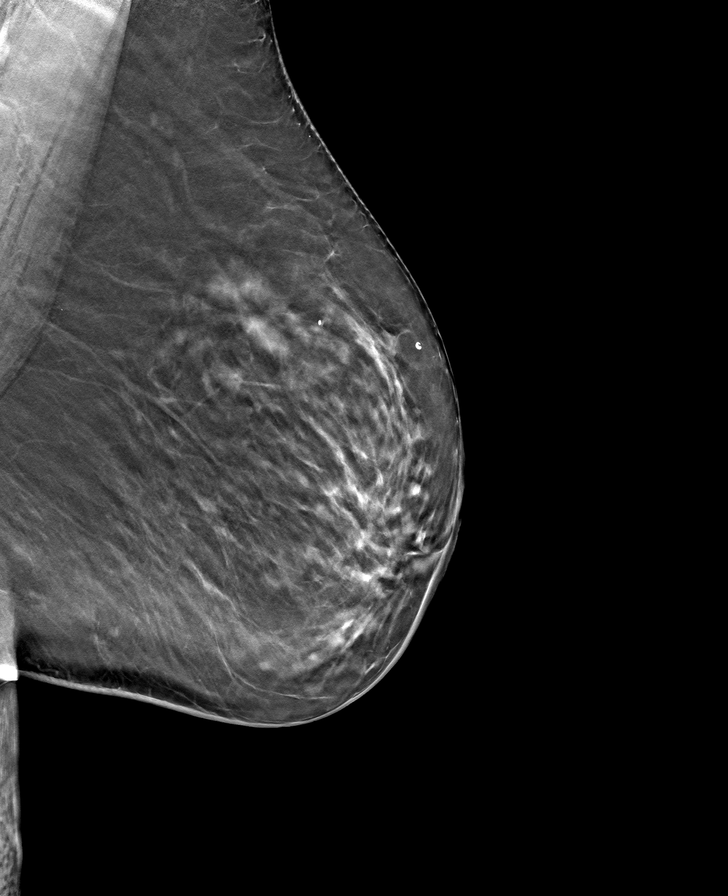

[R CC tomo · tomo slice 27/53.0]
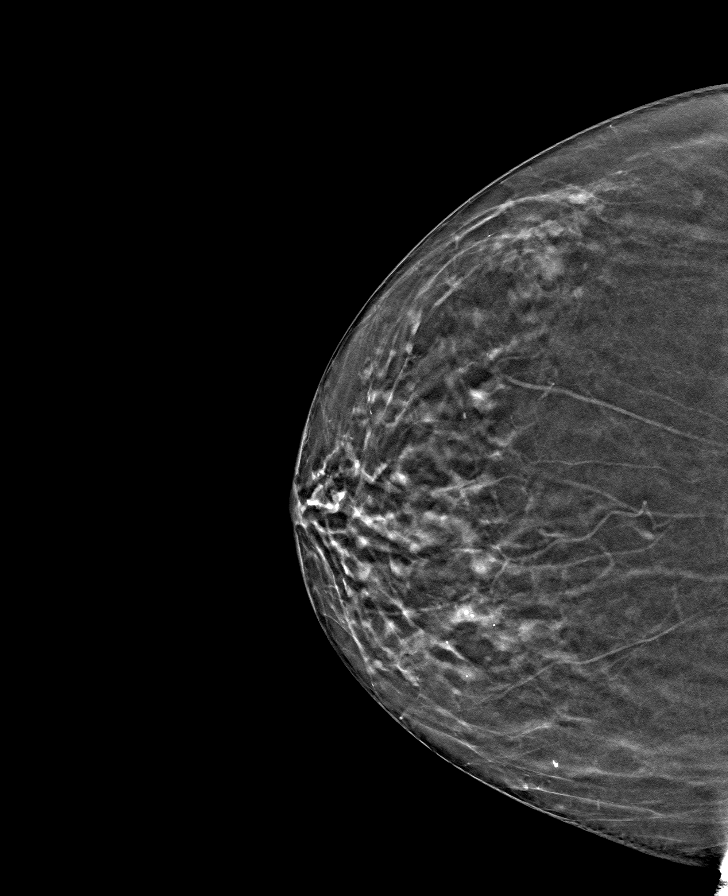

[8 of 24 positions shown; findings below may reference images not displayed]

ACR Breast Density Category b: There are scattered areas of
fibroglandular density.
FINDINGS: There are no findings suspicious for malignancy. Images were
processed with CAD.
IMPRESSION: No mammographic evidence of malignancy. A result letter of this
screening mammogram will be mailed directly to the patient.

RECOMMENDATION:
Screening mammogram in one year. (Code:[TQ])

BI-RADS CATEGORY  1: Negative.

## 2019-08-10 DIAGNOSIS — H903 Sensorineural hearing loss, bilateral: Secondary | ICD-10-CM | POA: Diagnosis not present

## 2019-08-10 DIAGNOSIS — H6122 Impacted cerumen, left ear: Secondary | ICD-10-CM | POA: Diagnosis not present

## 2019-10-12 DIAGNOSIS — Z961 Presence of intraocular lens: Secondary | ICD-10-CM | POA: Diagnosis not present

## 2019-12-23 DIAGNOSIS — I10 Essential (primary) hypertension: Secondary | ICD-10-CM | POA: Diagnosis not present

## 2019-12-23 DIAGNOSIS — E7800 Pure hypercholesterolemia, unspecified: Secondary | ICD-10-CM | POA: Diagnosis not present

## 2019-12-23 DIAGNOSIS — E1169 Type 2 diabetes mellitus with other specified complication: Secondary | ICD-10-CM | POA: Diagnosis not present

## 2019-12-23 DIAGNOSIS — E785 Hyperlipidemia, unspecified: Secondary | ICD-10-CM | POA: Diagnosis not present

## 2019-12-30 DIAGNOSIS — E7800 Pure hypercholesterolemia, unspecified: Secondary | ICD-10-CM | POA: Diagnosis not present

## 2019-12-30 DIAGNOSIS — M818 Other osteoporosis without current pathological fracture: Secondary | ICD-10-CM | POA: Diagnosis not present

## 2019-12-30 DIAGNOSIS — Z87891 Personal history of nicotine dependence: Secondary | ICD-10-CM | POA: Diagnosis not present

## 2019-12-30 DIAGNOSIS — I10 Essential (primary) hypertension: Secondary | ICD-10-CM | POA: Diagnosis not present

## 2019-12-30 DIAGNOSIS — J439 Emphysema, unspecified: Secondary | ICD-10-CM | POA: Diagnosis not present

## 2019-12-30 DIAGNOSIS — E119 Type 2 diabetes mellitus without complications: Secondary | ICD-10-CM | POA: Diagnosis not present

## 2020-01-05 DIAGNOSIS — M810 Age-related osteoporosis without current pathological fracture: Secondary | ICD-10-CM | POA: Diagnosis not present

## 2020-02-22 ENCOUNTER — Other Ambulatory Visit: Payer: Self-pay | Admitting: Family Medicine

## 2020-02-22 DIAGNOSIS — Z1231 Encounter for screening mammogram for malignant neoplasm of breast: Secondary | ICD-10-CM

## 2020-03-23 ENCOUNTER — Ambulatory Visit
Admission: RE | Admit: 2020-03-23 | Discharge: 2020-03-23 | Disposition: A | Discharge status: Home / Self Care | Payer: Medicare HMO | Source: Ambulatory Visit | Attending: Family Medicine | Admitting: Family Medicine

## 2020-03-23 DIAGNOSIS — Z1231 Encounter for screening mammogram for malignant neoplasm of breast: Secondary | ICD-10-CM | POA: Diagnosis not present

## 2020-03-23 IMAGING — MG DIGITAL SCREENING BILAT W/ TOMO W/ CAD
6 of 10 series · 6 of 30 positions shown · non-contrast
Comparison: Previous exam(s).

CLINICAL DATA: Screening.

EXAM:
DIGITAL SCREENING BILATERAL MAMMOGRAM WITH TOMO AND CAD

[R MLO synth-2D (1 of 2)]
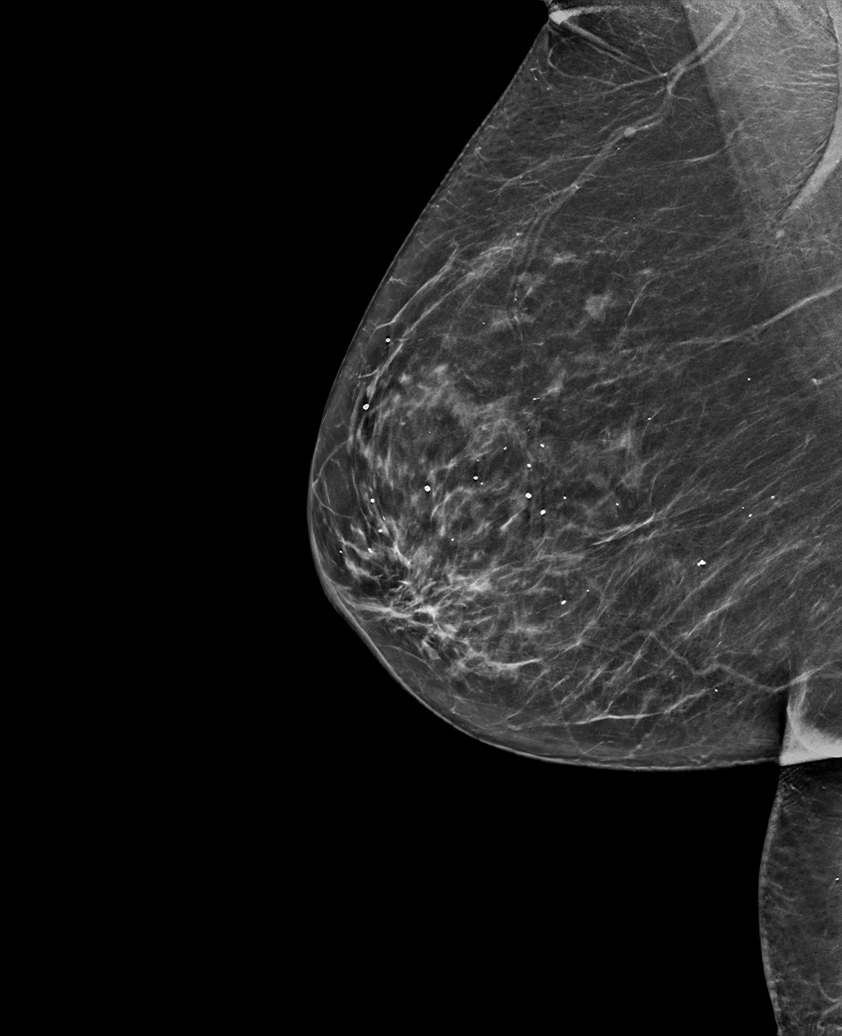

[L CC synth-2D]
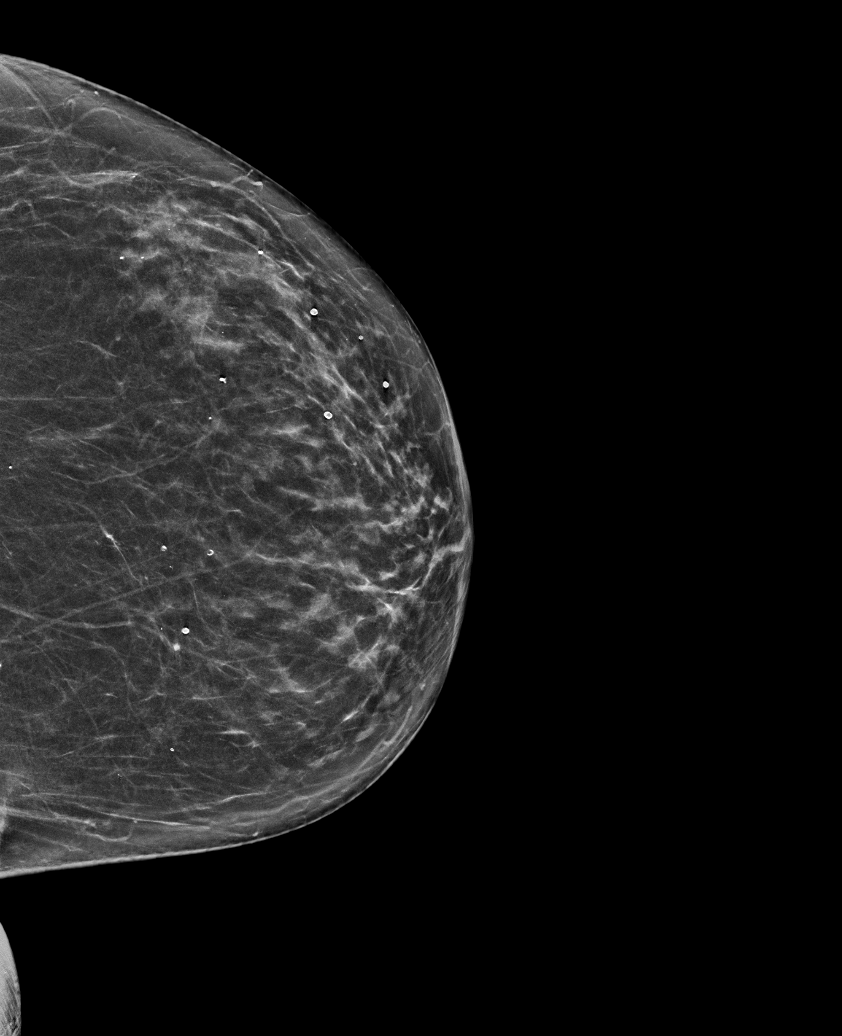

[L MLO synth-2D]
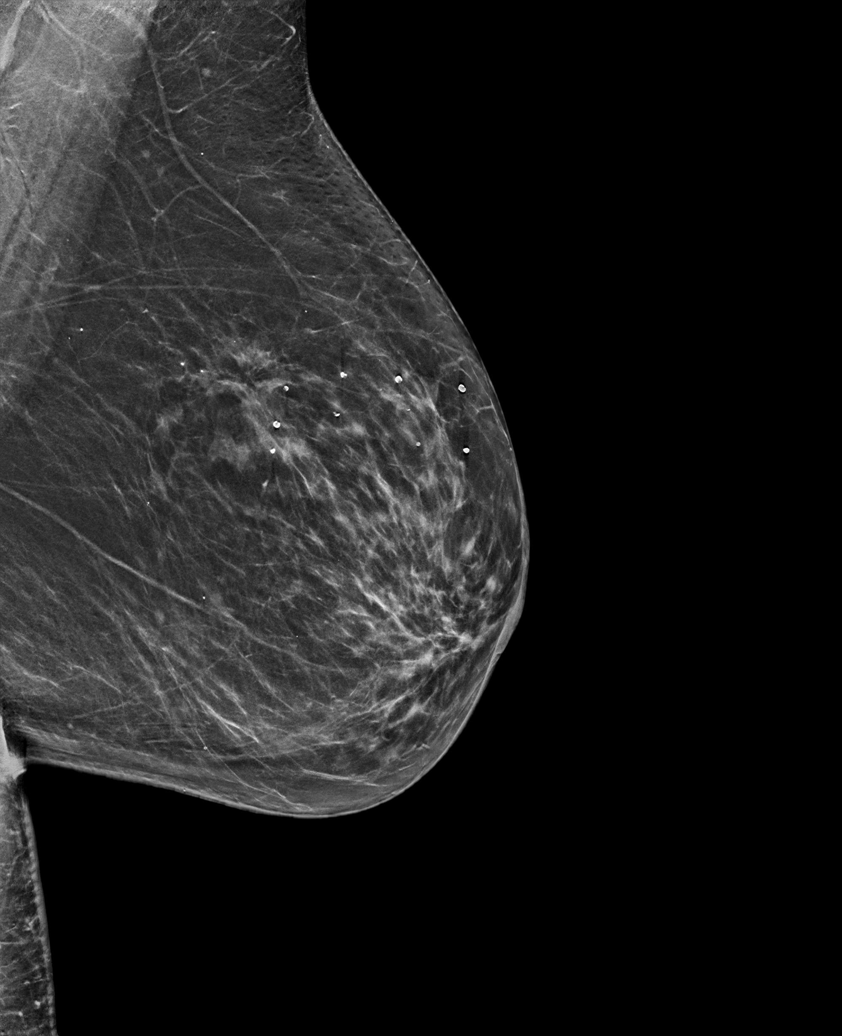

[R CC synth-2D]
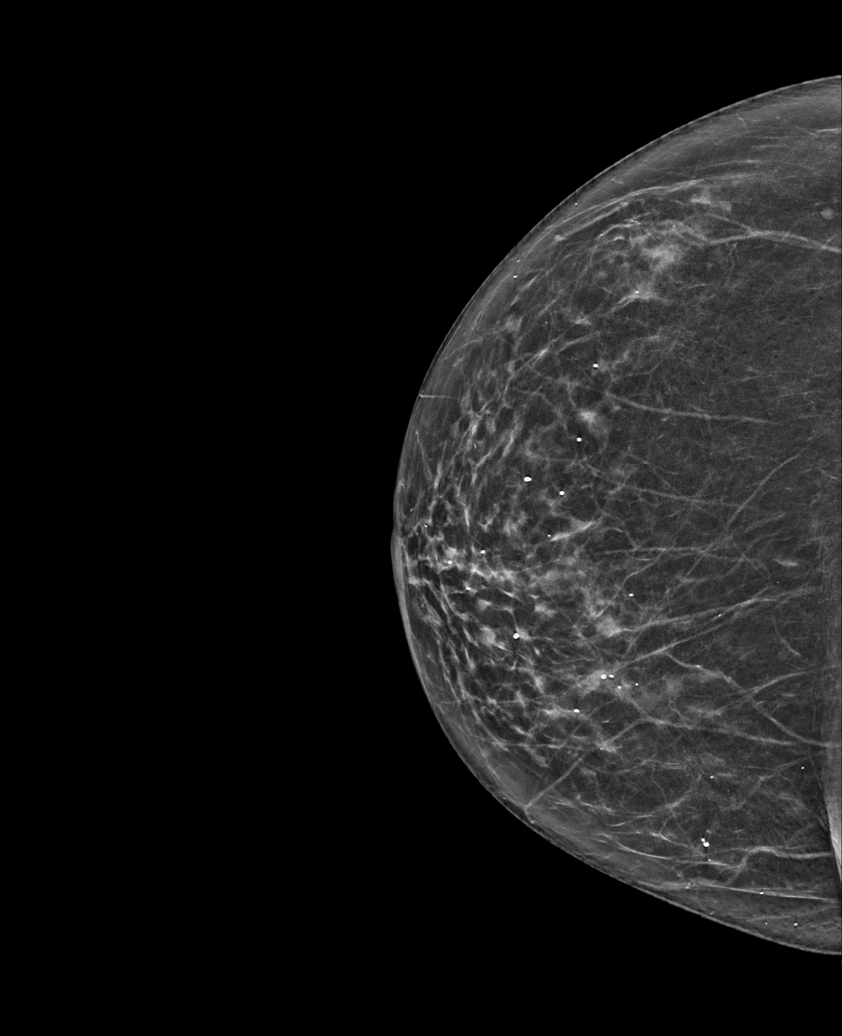

[R MLO synth-2D (2 of 2)]
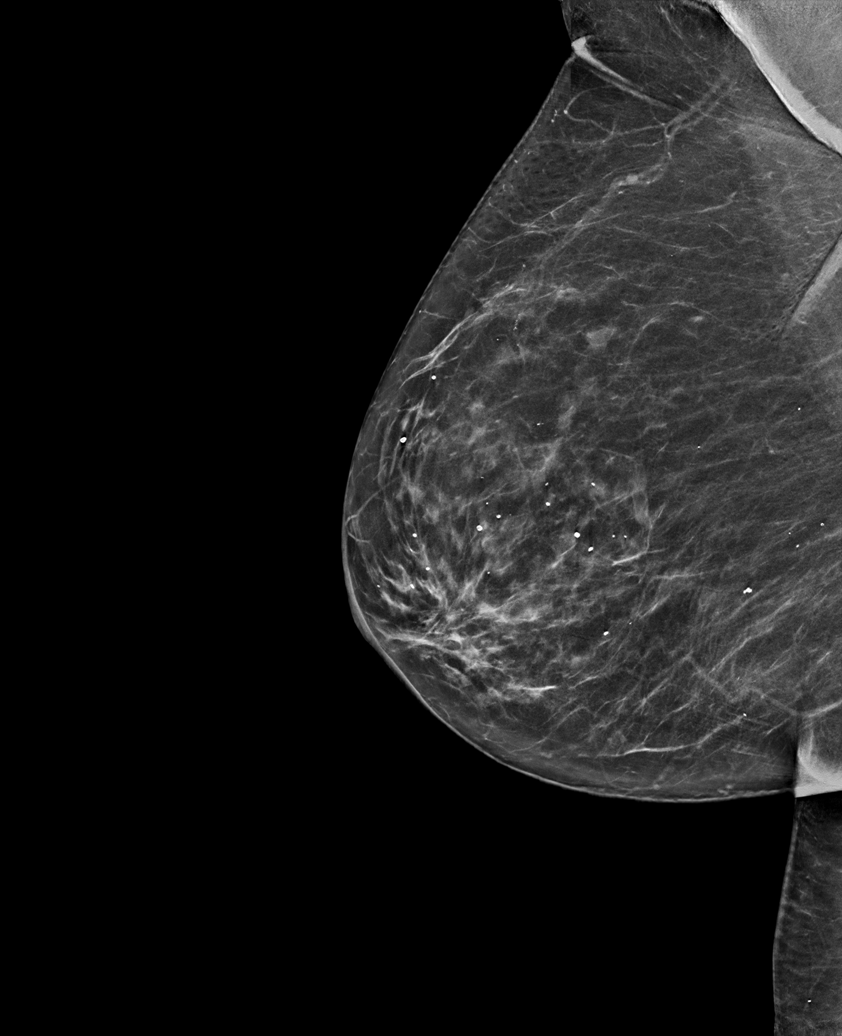

[L CC tomo · tomo slice 33/66.0]
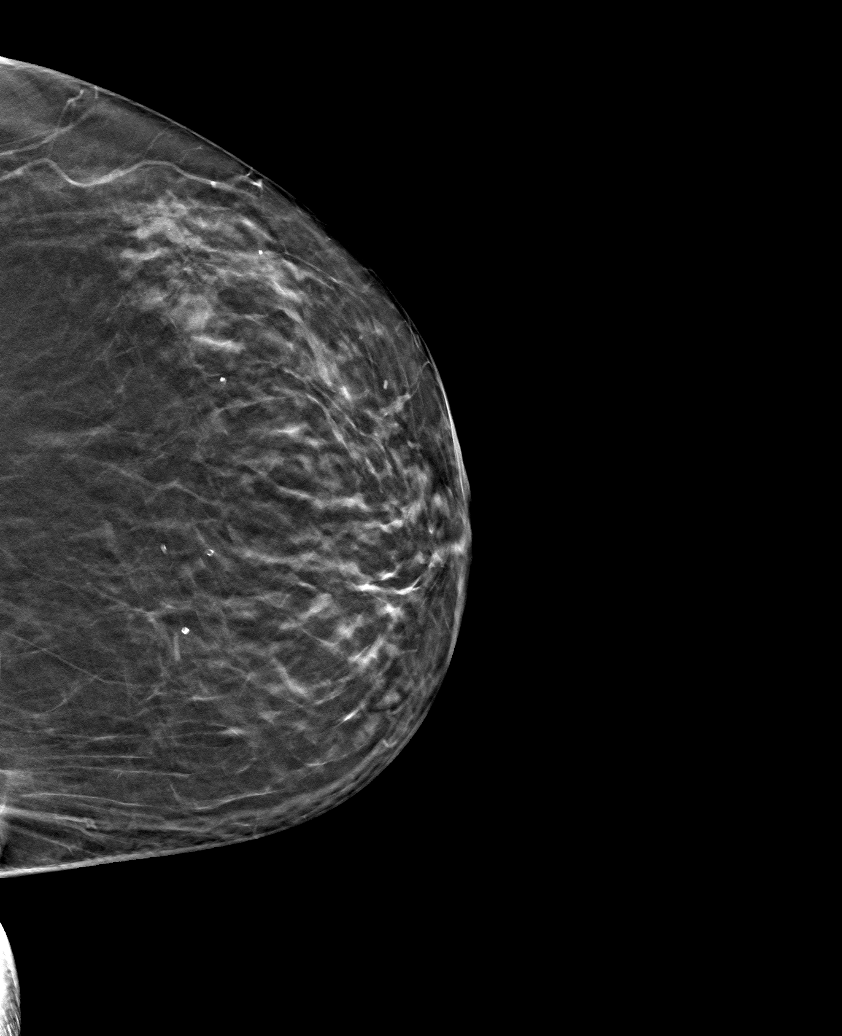

[6 of 30 positions shown; findings below may reference images not displayed]

ACR Breast Density Category c: The breast tissue is heterogeneously
dense, which may obscure small masses.
FINDINGS: There are no findings suspicious for malignancy. Images were
processed with CAD.
IMPRESSION: No mammographic evidence of malignancy. A result letter of this
screening mammogram will be mailed directly to the patient.

RECOMMENDATION:
Screening mammogram in one year. (Code:[5V])

BI-RADS CATEGORY  1: Negative.

## 2020-06-24 DIAGNOSIS — E1169 Type 2 diabetes mellitus with other specified complication: Secondary | ICD-10-CM | POA: Diagnosis not present

## 2020-06-24 DIAGNOSIS — I10 Essential (primary) hypertension: Secondary | ICD-10-CM | POA: Diagnosis not present

## 2020-06-24 DIAGNOSIS — E785 Hyperlipidemia, unspecified: Secondary | ICD-10-CM | POA: Diagnosis not present

## 2020-06-24 DIAGNOSIS — E7800 Pure hypercholesterolemia, unspecified: Secondary | ICD-10-CM | POA: Diagnosis not present

## 2020-07-01 DIAGNOSIS — Z23 Encounter for immunization: Secondary | ICD-10-CM | POA: Diagnosis not present

## 2020-07-01 DIAGNOSIS — D649 Anemia, unspecified: Secondary | ICD-10-CM | POA: Diagnosis not present

## 2020-07-01 DIAGNOSIS — I10 Essential (primary) hypertension: Secondary | ICD-10-CM | POA: Diagnosis not present

## 2020-07-01 DIAGNOSIS — Z0000 Encounter for general adult medical examination without abnormal findings: Secondary | ICD-10-CM | POA: Diagnosis not present

## 2020-07-01 DIAGNOSIS — R7303 Prediabetes: Secondary | ICD-10-CM | POA: Diagnosis not present

## 2020-07-01 DIAGNOSIS — E785 Hyperlipidemia, unspecified: Secondary | ICD-10-CM | POA: Diagnosis not present

## 2020-10-20 DIAGNOSIS — E113293 Type 2 diabetes mellitus with mild nonproliferative diabetic retinopathy without macular edema, bilateral: Secondary | ICD-10-CM | POA: Diagnosis not present

## 2020-10-20 DIAGNOSIS — Z0100 Encounter for examination of eyes and vision without abnormal findings: Secondary | ICD-10-CM | POA: Diagnosis not present

## 2020-10-26 DIAGNOSIS — Z862 Personal history of diseases of the blood and blood-forming organs and certain disorders involving the immune mechanism: Secondary | ICD-10-CM | POA: Diagnosis not present

## 2020-10-26 DIAGNOSIS — E7800 Pure hypercholesterolemia, unspecified: Secondary | ICD-10-CM | POA: Diagnosis not present

## 2020-10-26 DIAGNOSIS — E785 Hyperlipidemia, unspecified: Secondary | ICD-10-CM | POA: Diagnosis not present

## 2020-10-26 DIAGNOSIS — E1169 Type 2 diabetes mellitus with other specified complication: Secondary | ICD-10-CM | POA: Diagnosis not present

## 2020-11-08 DIAGNOSIS — D649 Anemia, unspecified: Secondary | ICD-10-CM | POA: Diagnosis not present

## 2020-11-08 DIAGNOSIS — E785 Hyperlipidemia, unspecified: Secondary | ICD-10-CM | POA: Diagnosis not present

## 2020-11-08 DIAGNOSIS — E7800 Pure hypercholesterolemia, unspecified: Secondary | ICD-10-CM | POA: Diagnosis not present

## 2020-11-08 DIAGNOSIS — N1831 Chronic kidney disease, stage 3a: Secondary | ICD-10-CM | POA: Diagnosis not present

## 2020-11-08 DIAGNOSIS — E1169 Type 2 diabetes mellitus with other specified complication: Secondary | ICD-10-CM | POA: Diagnosis not present

## 2020-11-08 DIAGNOSIS — I517 Cardiomegaly: Secondary | ICD-10-CM | POA: Diagnosis not present

## 2020-11-08 DIAGNOSIS — I10 Essential (primary) hypertension: Secondary | ICD-10-CM | POA: Diagnosis not present

## 2020-11-08 DIAGNOSIS — R0600 Dyspnea, unspecified: Secondary | ICD-10-CM | POA: Diagnosis not present

## 2020-11-17 ENCOUNTER — Other Ambulatory Visit (HOSPITAL_COMMUNITY): Payer: Self-pay | Admitting: Family Medicine

## 2020-11-17 ENCOUNTER — Other Ambulatory Visit: Payer: Self-pay | Admitting: Family Medicine

## 2020-11-17 DIAGNOSIS — I517 Cardiomegaly: Secondary | ICD-10-CM

## 2020-11-17 DIAGNOSIS — R0609 Other forms of dyspnea: Secondary | ICD-10-CM

## 2020-11-17 DIAGNOSIS — R0600 Dyspnea, unspecified: Secondary | ICD-10-CM

## 2020-11-17 DIAGNOSIS — R918 Other nonspecific abnormal finding of lung field: Secondary | ICD-10-CM

## 2020-11-28 ENCOUNTER — Other Ambulatory Visit: Payer: Self-pay

## 2020-11-28 ENCOUNTER — Ambulatory Visit
Admission: RE | Admit: 2020-11-28 | Discharge: 2020-11-28 | Disposition: A | Discharge status: Home / Self Care | Payer: Medicare HMO | Source: Ambulatory Visit | Attending: Family Medicine | Admitting: Family Medicine

## 2020-11-28 DIAGNOSIS — R918 Other nonspecific abnormal finding of lung field: Secondary | ICD-10-CM | POA: Insufficient documentation

## 2020-11-28 DIAGNOSIS — R0609 Other forms of dyspnea: Secondary | ICD-10-CM

## 2020-11-28 DIAGNOSIS — I517 Cardiomegaly: Secondary | ICD-10-CM | POA: Insufficient documentation

## 2020-11-28 DIAGNOSIS — R0600 Dyspnea, unspecified: Secondary | ICD-10-CM | POA: Insufficient documentation

## 2020-11-28 IMAGING — CT CT CHEST W/O CM
2 of 3 series · 15 of 36 positions shown, 18 images · non-contrast
Comparison: Report from radiograph [DATE].

CLINICAL DATA: Cardiomegaly. Exertional dyspnea. Pulmonary
infiltrate in right lung on chest x-ray.

EXAM:
CT CHEST WITHOUT CONTRAST
TECHNIQUE: Multidetector CT imaging of the chest was performed following the
standard protocol without IV contrast.

[Series 2: thorax · axial · 0.64mm/px · z∈[-715,-449]mm · 12 of 157 slices shown, 15 images]
[im 12/157  mediastinal]
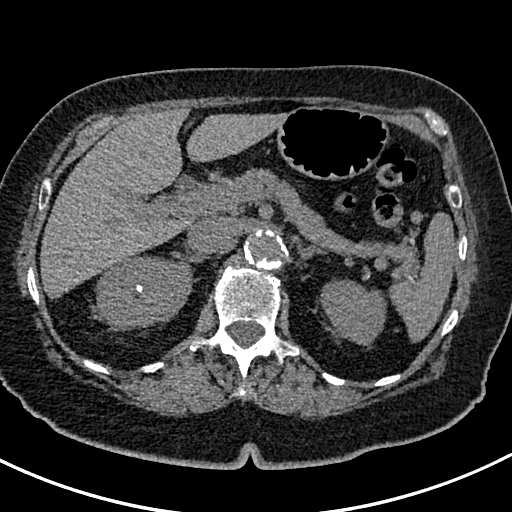
[im 12/157  lung]
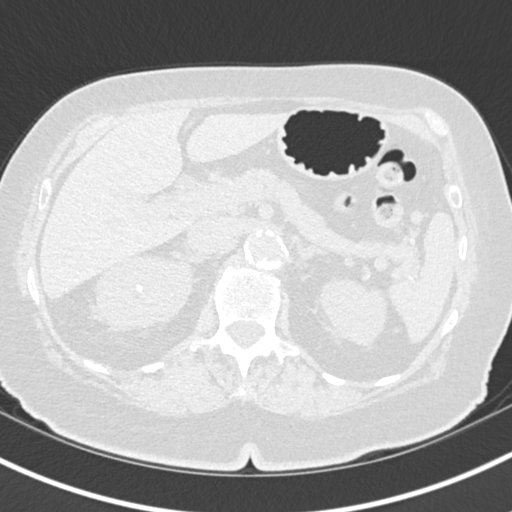
[im 24/157  lung]
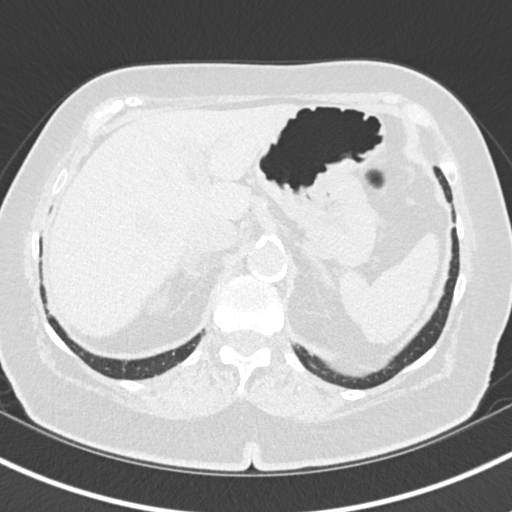
[im 35/157  lung]
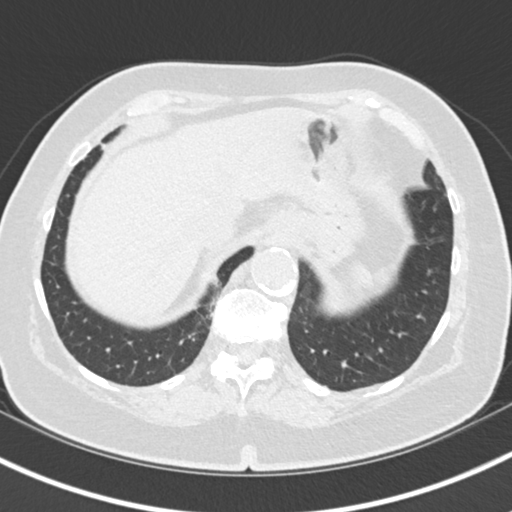
[im 47/157  lung]
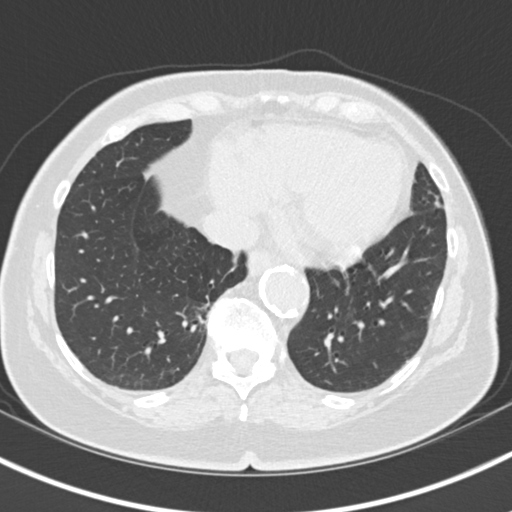
[im 58/157  mediastinal]
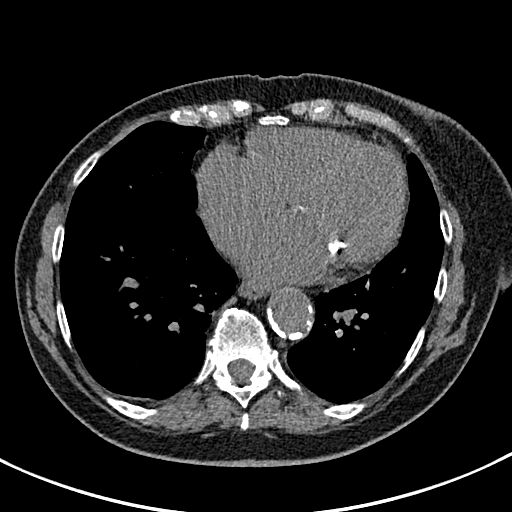
[im 58/157  lung]
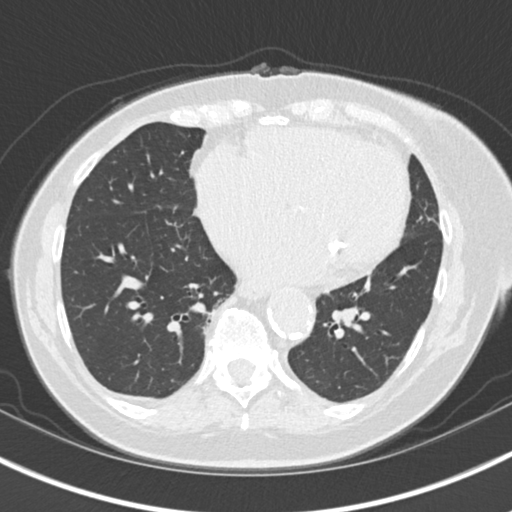
[im 70/157  lung]
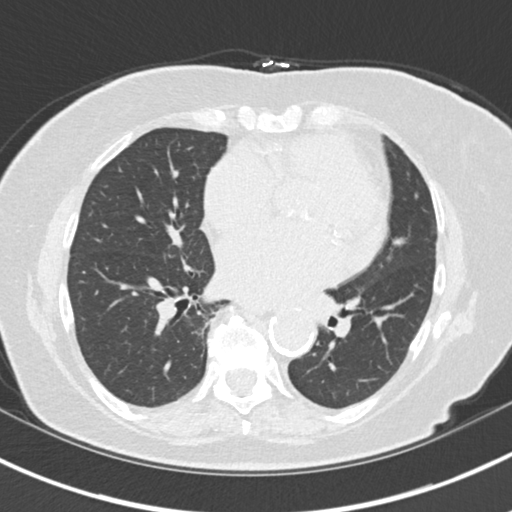
[im 87/157  lung]
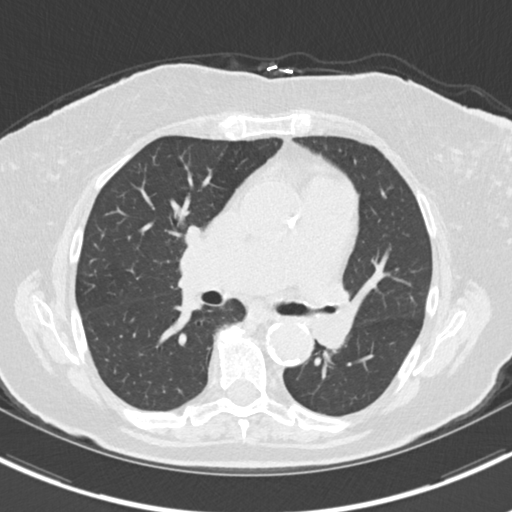
[im 99/157  lung]
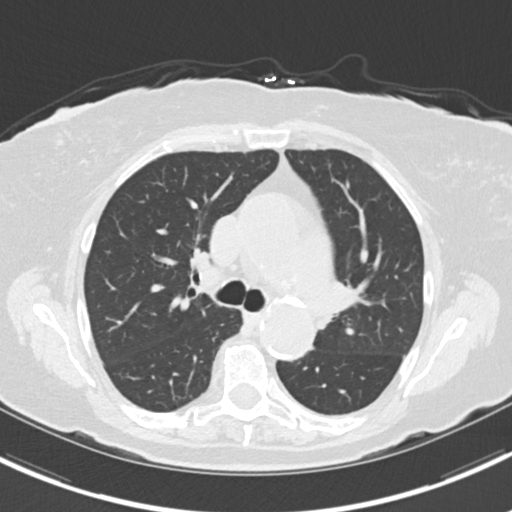
[im 110/157  mediastinal]
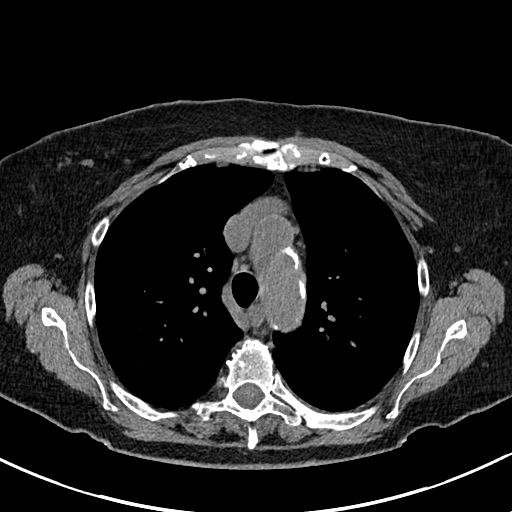
[im 110/157  lung]
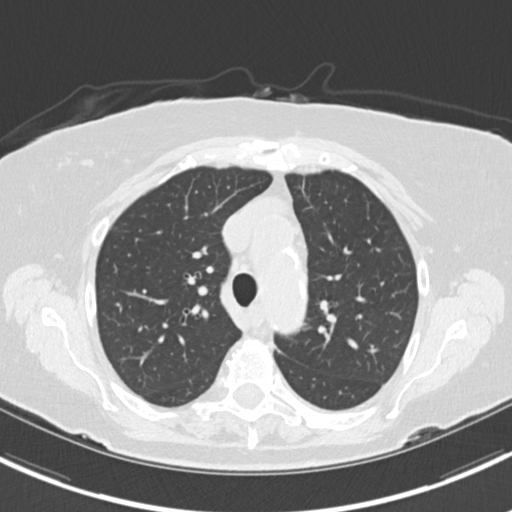
[im 122/157  lung]
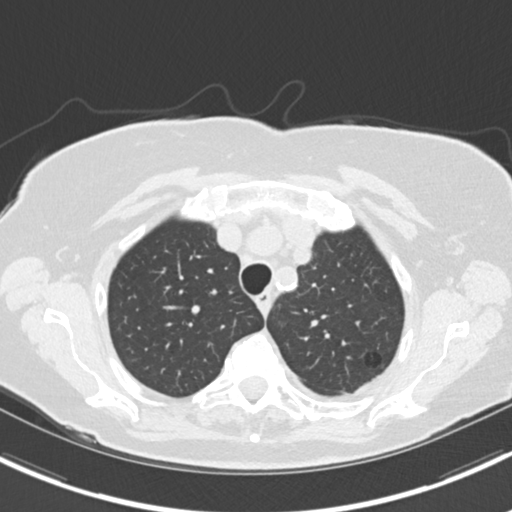
[im 133/157  lung]
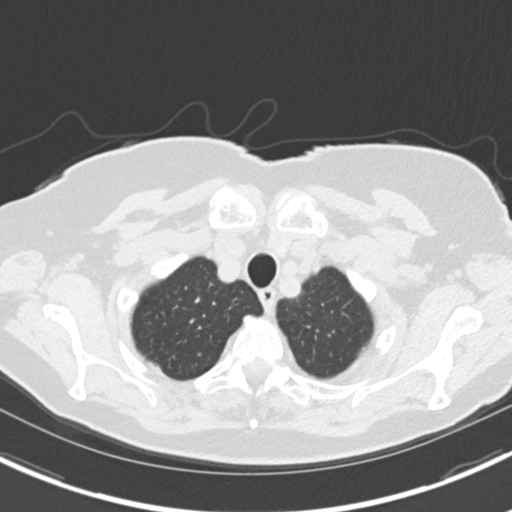
[im 145/157  lung]
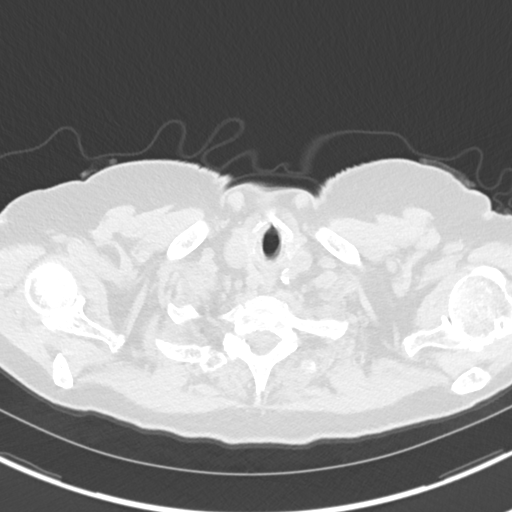

[Series 5: coronal · coronal · 0.65mm/px · 3 of 137 slices shown]
[im 28/137  lung]
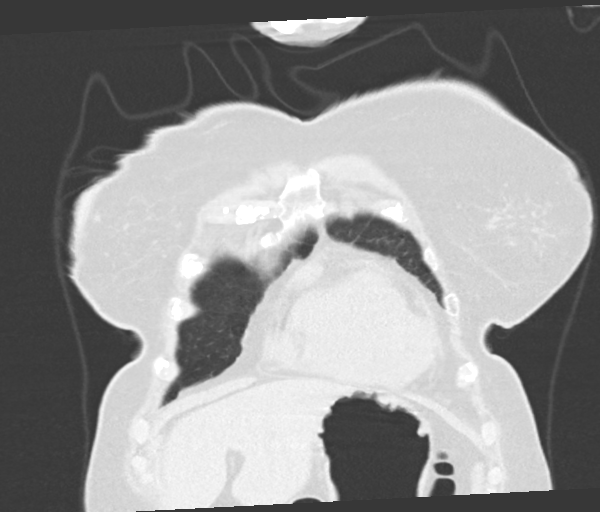
[im 55/137  lung]
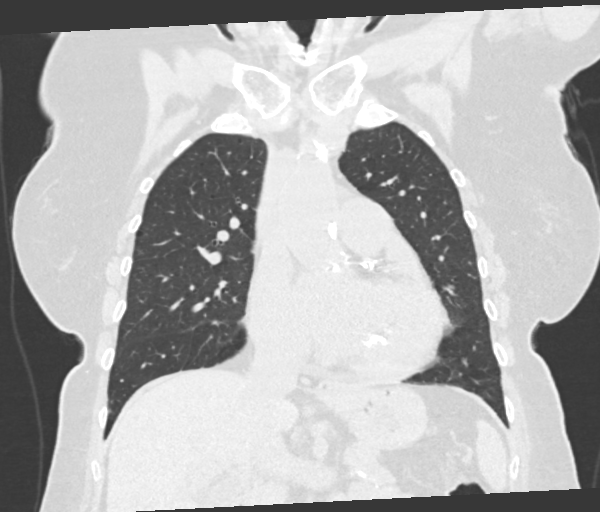
[im 82/137  lung]
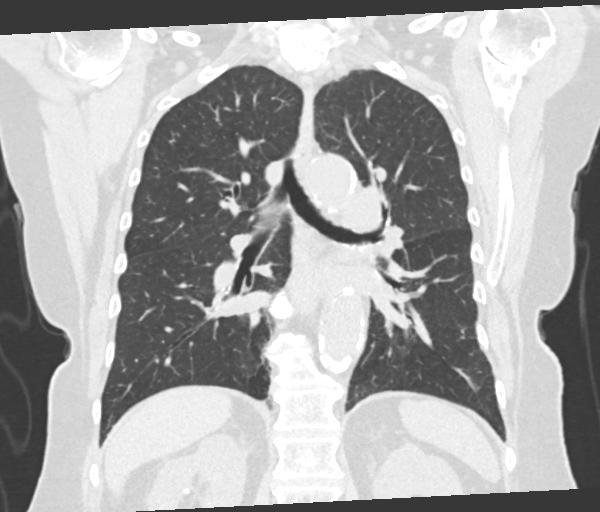

[15 of 36 positions shown; findings below may reference images not displayed]

FINDINGS: Cardiovascular: Moderate aortic atherosclerosis. No aortic aneurysm
or periaortic inflammation. Mild multi chamber cardiomegaly. Mild
dilatation of the main pulmonary artery at 3.7 cm. There mitral
annulus calcifications. There are coronary artery calcifications. No
pericardial effusion.

Mediastinum/Nodes: Multiple small mediastinal lymph nodes are not
enlarged by size criteria. There is no axillary adenopathy. No bulky
adenopathy, limited assessment in the absence of IV contrast. 11 mm
right thyroid nodule. Not clinically significant; no follow-up
imaging recommended (ref: [HOSPITAL]. [DATE]):
143-50).No esophageal wall thickening.

Lungs/Pleura: Minimal apical predominant emphysema. Compressive
atelectasis in the medial right lower lobe related to thoracic
osteophytes. No pneumonia or parenchymal consolidation. Subsegmental
atelectasis or scarring in the left lower lobe. No findings of
pulmonary edema. No pleural fluid. No pulmonary nodule or mass.
Trachea and central bronchi are patent.

Upper Abdomen: Atherosclerosis of upper abdominal aorta.
Calcifications of both renal hila are felt to be vascular rather
than nonobstructing stones. Gallstone is partially included.

Musculoskeletal: Diffuse thoracic spondylosis with endplate
spurring. Incidental vertebral body hemangioma within T4.
Degenerative change of both glenohumeral joints. No chest wall soft
tissue abnormality.
IMPRESSION: 1. Mild atelectasis in the medial right lower lobe related to
thoracic spine osteophytes. No evidence of pneumonia or focal
airspace disease.
2. Mild cardiomegaly. Coronary artery and mitral annulus
calcifications calcifications.
3. Mild dilatation of the main pulmonary artery, can be seen with
pulmonary arterial hypertension.
4. Thoracic aortic atherosclerosis.
5. Mild emphysema.
6. Cholelithiasis is incidentally and partially included in the
upper abdomen.

Aortic Atherosclerosis ([R7]-[R7]) and Emphysema ([R7]-[R7]).

## 2020-12-05 DIAGNOSIS — I700 Atherosclerosis of aorta: Secondary | ICD-10-CM | POA: Diagnosis not present

## 2020-12-05 DIAGNOSIS — I517 Cardiomegaly: Secondary | ICD-10-CM | POA: Diagnosis not present

## 2020-12-05 DIAGNOSIS — J439 Emphysema, unspecified: Secondary | ICD-10-CM | POA: Diagnosis present

## 2020-12-05 DIAGNOSIS — J438 Other emphysema: Secondary | ICD-10-CM | POA: Diagnosis not present

## 2021-01-23 DIAGNOSIS — R0600 Dyspnea, unspecified: Secondary | ICD-10-CM | POA: Diagnosis not present

## 2021-01-23 DIAGNOSIS — I517 Cardiomegaly: Secondary | ICD-10-CM | POA: Diagnosis not present

## 2021-01-31 DIAGNOSIS — I351 Nonrheumatic aortic (valve) insufficiency: Secondary | ICD-10-CM | POA: Diagnosis not present

## 2021-01-31 DIAGNOSIS — I340 Nonrheumatic mitral (valve) insufficiency: Secondary | ICD-10-CM | POA: Diagnosis not present

## 2021-02-08 DIAGNOSIS — Z862 Personal history of diseases of the blood and blood-forming organs and certain disorders involving the immune mechanism: Secondary | ICD-10-CM | POA: Diagnosis not present

## 2021-02-08 DIAGNOSIS — E785 Hyperlipidemia, unspecified: Secondary | ICD-10-CM | POA: Diagnosis not present

## 2021-02-08 DIAGNOSIS — N1831 Chronic kidney disease, stage 3a: Secondary | ICD-10-CM | POA: Diagnosis not present

## 2021-02-08 DIAGNOSIS — E7800 Pure hypercholesterolemia, unspecified: Secondary | ICD-10-CM | POA: Diagnosis not present

## 2021-02-08 DIAGNOSIS — E1169 Type 2 diabetes mellitus with other specified complication: Secondary | ICD-10-CM | POA: Diagnosis not present

## 2021-02-15 DIAGNOSIS — I10 Essential (primary) hypertension: Secondary | ICD-10-CM | POA: Diagnosis not present

## 2021-02-15 DIAGNOSIS — E7800 Pure hypercholesterolemia, unspecified: Secondary | ICD-10-CM | POA: Diagnosis not present

## 2021-02-15 DIAGNOSIS — E1169 Type 2 diabetes mellitus with other specified complication: Secondary | ICD-10-CM | POA: Diagnosis not present

## 2021-02-15 DIAGNOSIS — Z862 Personal history of diseases of the blood and blood-forming organs and certain disorders involving the immune mechanism: Secondary | ICD-10-CM | POA: Diagnosis not present

## 2021-02-23 DIAGNOSIS — U071 COVID-19: Secondary | ICD-10-CM | POA: Diagnosis not present

## 2021-02-23 DIAGNOSIS — I4891 Unspecified atrial fibrillation: Secondary | ICD-10-CM | POA: Diagnosis not present

## 2021-02-23 DIAGNOSIS — R42 Dizziness and giddiness: Secondary | ICD-10-CM | POA: Diagnosis not present

## 2021-02-27 ENCOUNTER — Other Ambulatory Visit: Payer: Self-pay | Admitting: Family Medicine

## 2021-02-27 DIAGNOSIS — Z1231 Encounter for screening mammogram for malignant neoplasm of breast: Secondary | ICD-10-CM

## 2021-03-01 DIAGNOSIS — I480 Paroxysmal atrial fibrillation: Secondary | ICD-10-CM | POA: Diagnosis not present

## 2021-03-01 DIAGNOSIS — I10 Essential (primary) hypertension: Secondary | ICD-10-CM | POA: Diagnosis not present

## 2021-03-01 DIAGNOSIS — E7800 Pure hypercholesterolemia, unspecified: Secondary | ICD-10-CM | POA: Diagnosis not present

## 2021-03-07 DIAGNOSIS — I10 Essential (primary) hypertension: Secondary | ICD-10-CM | POA: Diagnosis not present

## 2021-03-07 DIAGNOSIS — I480 Paroxysmal atrial fibrillation: Secondary | ICD-10-CM | POA: Diagnosis not present

## 2021-03-22 ENCOUNTER — Ambulatory Visit: Payer: Medicare HMO | Admitting: Anesthesiology

## 2021-03-22 ENCOUNTER — Encounter: Payer: Self-pay | Admitting: Internal Medicine

## 2021-03-22 ENCOUNTER — Encounter
Admission: RE | Disposition: A | Discharge status: Home / Self Care | Payer: Self-pay | Source: Home / Self Care | Attending: Internal Medicine

## 2021-03-22 ENCOUNTER — Ambulatory Visit
Admission: RE | Admit: 2021-03-22 | Discharge: 2021-03-22 | Disposition: A | Discharge status: Home / Self Care | Payer: Medicare HMO | Attending: Internal Medicine | Admitting: Internal Medicine

## 2021-03-22 DIAGNOSIS — Z8719 Personal history of other diseases of the digestive system: Secondary | ICD-10-CM | POA: Insufficient documentation

## 2021-03-22 DIAGNOSIS — I10 Essential (primary) hypertension: Secondary | ICD-10-CM | POA: Diagnosis not present

## 2021-03-22 DIAGNOSIS — Z701 Counseling related to patient's sexual behavior and orientation: Secondary | ICD-10-CM | POA: Insufficient documentation

## 2021-03-22 DIAGNOSIS — Z87891 Personal history of nicotine dependence: Secondary | ICD-10-CM | POA: Insufficient documentation

## 2021-03-22 DIAGNOSIS — K219 Gastro-esophageal reflux disease without esophagitis: Secondary | ICD-10-CM | POA: Diagnosis not present

## 2021-03-22 DIAGNOSIS — Z79899 Other long term (current) drug therapy: Secondary | ICD-10-CM | POA: Insufficient documentation

## 2021-03-22 DIAGNOSIS — I4891 Unspecified atrial fibrillation: Secondary | ICD-10-CM | POA: Diagnosis not present

## 2021-03-22 DIAGNOSIS — E785 Hyperlipidemia, unspecified: Secondary | ICD-10-CM | POA: Diagnosis not present

## 2021-03-22 DIAGNOSIS — Z7983 Long term (current) use of bisphosphonates: Secondary | ICD-10-CM | POA: Diagnosis not present

## 2021-03-22 DIAGNOSIS — I480 Paroxysmal atrial fibrillation: Secondary | ICD-10-CM | POA: Diagnosis not present

## 2021-03-22 DIAGNOSIS — R7303 Prediabetes: Secondary | ICD-10-CM | POA: Diagnosis not present

## 2021-03-22 HISTORY — PX: CARDIOVERSION: SHX1299

## 2021-03-22 SURGERY — CARDIOVERSION
Anesthesia: General

## 2021-03-22 MED ORDER — PROPOFOL 10 MG/ML IV BOLUS
INTRAVENOUS | Status: AC
  Filled 2021-03-22: qty 20

## 2021-03-22 MED ORDER — PROPOFOL 10 MG/ML IV BOLUS
INTRAVENOUS | Status: DC | PRN
  Administered 2021-03-22: 10 mg via INTRAVENOUS
  Administered 2021-03-22: 40 mg via INTRAVENOUS

## 2021-03-22 MED ORDER — LIDOCAINE HCL (PF) 2 % IJ SOLN
INTRAMUSCULAR | Status: DC | PRN
  Administered 2021-03-22: 40 mg

## 2021-03-22 NOTE — Transfer of Care (Signed)
Immediate Anesthesia Transfer of Care Note  Patient: Terry Kaiser  Procedure(s) Performed: CARDIOVERSION  Patient Location: PACU  Anesthesia Type:General  Level of Consciousness: sedated  Airway & Oxygen Therapy: Patient Spontanous Breathing and Patient connected to nasal cannula oxygen  Post-op Assessment: Report given to RN and Post -op Vital signs reviewed and stable  Post vital signs: Reviewed and stable  Last Vitals:  Vitals Value Taken Time  BP    Temp    Pulse    Resp    SpO2      Last Pain:  Vitals:   03/22/21 0651  TempSrc: Oral         Complications: No notable events documented.

## 2021-03-22 NOTE — Anesthesia Procedure Notes (Signed)
Date/Time: 03/22/2021 7:35 AM Performed by: Allean Found, CRNA Pre-anesthesia Checklist: Patient identified, Emergency Drugs available, Suction available, Patient being monitored and Timeout performed Oxygen Delivery Method: Nasal cannula Placement Confirmation: positive ETCO2

## 2021-03-22 NOTE — CV Procedure (Signed)
Electrical Cardioversion Procedure Note Terry Kaiser [id number] [birth year]  Procedure: Electrical Cardioversion Indications:  Paroxysmal non valvular atrial fibrillation  Procedure Details Consent: Risks of procedure as well as the alternatives and risks of each were explained to the (patient/caregiver).  Consent for procedure obtained. Time Out: Verified patient identification, verified procedure, site/side was marked, verified correct patient position, special equipment/implants available, medications/allergies/relevent history reviewed, required imaging and test results available.  Performed  Patient placed on cardiac monitor, pulse oximetry, supplemental oxygen as necessary.  Sedation given: Propofol and versed as per anesthesia  Pacer pads placed anterior and posterior chest.  Cardioverted 1 time(s).  Cardioverted at 120J.  Evaluation Findings: Post procedure EKG shows: NSR Complications: None Patient did tolerate procedure well.   Terry Kaiser M.D. [facility] 03/22/2021, 7:39 AM

## 2021-03-22 NOTE — Anesthesia Preprocedure Evaluation (Signed)
Anesthesia Evaluation  Patient identified by MRN, date of birth, ID band Patient awake    Reviewed: Allergy & Precautions, NPO status , Patient's Chart, lab work & pertinent test results  History of Anesthesia Complications Negative for: history of anesthetic complications  Airway Mallampati: III  TM Distance: <3 FB Neck ROM: limited    Dental  (+) Chipped   Pulmonary neg shortness of breath, former smoker,    Pulmonary exam normal        Cardiovascular Exercise Tolerance: Good hypertension, (-) Past MI + dysrhythmias Atrial Fibrillation  Rhythm:irregular Rate:Normal     Neuro/Psych negative neurological ROS  negative psych ROS   GI/Hepatic Neg liver ROS, GERD  Medicated and Controlled,  Endo/Other  diabetes, Type 2  Renal/GU negative Renal ROS  negative genitourinary   Musculoskeletal  (+) Arthritis ,   Abdominal   Peds  Hematology negative hematology ROS (+)   Anesthesia Other Findings Past Medical History: No date: Arthritis     Comment:  joints 06/22/15: Borderline diabetes mellitus     Comment:  A1c 6.1%; diet controlled No date: Cancer (HCC)     Comment:  skin No date: Colon polyps No date: Diabetes mellitus without complication (HCC)     Comment:  diet controlled, type 2 No date: Diverticulosis No date: GERD (gastroesophageal reflux disease)     Comment:  without esophagitis No date: HOH (hard of hearing)     Comment:  aids No date: Hypertension     Comment:  controlled on meds No date: Osteoporosis No date: Pure hypercholesterolemia No date: Seasonal allergies  Past Surgical History: 1992: BREAST CYST ASPIRATION; Left     Comment:  benign 2000: BREAST EXCISIONAL BIOPSY; Right     Comment:  benign 02/21/2015: CATARACT EXTRACTION W/PHACO; Left     Comment:  Procedure: CATARACT EXTRACTION PHACO AND INTRAOCULAR               LENS PLACEMENT ([facility]);  Surgeon: Estill Cotta, MD;                 Location: [facility] ORS;  Service: Ophthalmology;  Laterality:              Left;   00:59 AP% 25.2 CDE 24.66 02/26/2018: CATARACT EXTRACTION W/PHACO; Right     Comment:  Procedure: CATARACT EXTRACTION PHACO AND INTRAOCULAR               LENS PLACEMENT ([place])  DIABETIC RIGHT;  Surgeon:               Leandrew Koyanagi, MD;  Location: [place];              Service: Ophthalmology;  Laterality: Right;                Diabetes-diet controlled 12/22/2015: COLONOSCOPY WITH PROPOFOL; N/A     Comment:  Procedure: COLONOSCOPY WITH PROPOFOL;  Surgeon: Manya Silvas, MD;  Location: [facility] ENDOSCOPY;  Service:               Endoscopy;  Laterality: N/A; No date: DILATION AND CURETTAGE OF UTERUS  BMI    Body Mass Index: 23.24 kg/m      Reproductive/Obstetrics negative OB ROS                             Anesthesia Physical Anesthesia Plan  ASA: 3  Anesthesia Plan: General   Post-op Pain Management:    Induction: Intravenous  PONV Risk Score and Plan: Propofol infusion and TIVA  Airway Management Planned: Natural Airway and Nasal Cannula  Additional Equipment:   Intra-op Plan:   Post-operative Plan:   Informed Consent: I have reviewed the patients History and Physical, chart, labs and discussed the procedure including the risks, benefits and alternatives for the proposed anesthesia with the patient or authorized representative who has indicated his/her understanding and acceptance.     Dental Advisory Given  Plan Discussed with: Anesthesiologist, CRNA and Surgeon  Anesthesia Plan Comments: (Patient consented for risks of anesthesia including but not limited to:  - adverse reactions to medications - risk of airway placement if required - damage to eyes, teeth, lips or other oral mucosa - nerve damage due to positioning  - sore throat or hoarseness - Damage to heart, brain, nerves, lungs, other parts of body or loss of  life  Patient voiced understanding.)        Anesthesia Quick Evaluation

## 2021-03-23 ENCOUNTER — Encounter: Payer: Self-pay | Admitting: Internal Medicine

## 2021-03-23 NOTE — Anesthesia Postprocedure Evaluation (Signed)
Anesthesia Post Note  Patient: Terry Kaiser  Procedure(s) Performed: CARDIOVERSION  Patient location during evaluation: Cath Lab Anesthesia Type: General Level of consciousness: awake and alert Pain management: pain level controlled Vital Signs Assessment: post-procedure vital signs reviewed and stable Respiratory status: spontaneous breathing, nonlabored ventilation, respiratory function stable and patient connected to nasal cannula oxygen Cardiovascular status: blood pressure returned to baseline and stable Postop Assessment: no apparent nausea or vomiting Anesthetic complications: no   No notable events documented.   Last Vitals:  Vitals:   03/22/21 0743 03/22/21 0752  BP:  (!) 117/54  Pulse: (!) 48 (!) 54  Resp: 16 20  Temp:    SpO2: 97% 97%    Last Pain:  Vitals:   03/22/21 0651  TempSrc: Oral                 Precious Haws Stana Bayon

## 2021-04-13 DIAGNOSIS — I480 Paroxysmal atrial fibrillation: Secondary | ICD-10-CM | POA: Diagnosis not present

## 2021-04-17 DIAGNOSIS — I10 Essential (primary) hypertension: Secondary | ICD-10-CM | POA: Diagnosis not present

## 2021-04-17 DIAGNOSIS — E1169 Type 2 diabetes mellitus with other specified complication: Secondary | ICD-10-CM | POA: Diagnosis not present

## 2021-04-17 DIAGNOSIS — I480 Paroxysmal atrial fibrillation: Secondary | ICD-10-CM | POA: Diagnosis not present

## 2021-04-17 DIAGNOSIS — E785 Hyperlipidemia, unspecified: Secondary | ICD-10-CM | POA: Diagnosis not present

## 2021-04-24 DIAGNOSIS — Z0100 Encounter for examination of eyes and vision without abnormal findings: Secondary | ICD-10-CM | POA: Diagnosis not present

## 2021-04-26 DIAGNOSIS — N939 Abnormal uterine and vaginal bleeding, unspecified: Secondary | ICD-10-CM | POA: Diagnosis not present

## 2021-05-22 DIAGNOSIS — R600 Localized edema: Secondary | ICD-10-CM | POA: Diagnosis not present

## 2021-05-22 DIAGNOSIS — R001 Bradycardia, unspecified: Secondary | ICD-10-CM | POA: Diagnosis not present

## 2021-05-22 DIAGNOSIS — I10 Essential (primary) hypertension: Secondary | ICD-10-CM | POA: Diagnosis not present

## 2021-05-22 DIAGNOSIS — I480 Paroxysmal atrial fibrillation: Secondary | ICD-10-CM | POA: Diagnosis not present

## 2021-06-06 ENCOUNTER — Ambulatory Visit
Admission: RE | Admit: 2021-06-06 | Discharge: 2021-06-06 | Disposition: A | Discharge status: Home / Self Care | Payer: Medicare HMO | Source: Ambulatory Visit | Attending: Family Medicine | Admitting: Family Medicine

## 2021-06-06 ENCOUNTER — Other Ambulatory Visit: Payer: Self-pay

## 2021-06-06 DIAGNOSIS — Z1231 Encounter for screening mammogram for malignant neoplasm of breast: Secondary | ICD-10-CM

## 2021-06-06 IMAGING — MG MM DIGITAL SCREENING BILAT W/ TOMO AND CAD
6 of 10 series · 6 of 30 positions shown · non-contrast
Comparison: Previous exam(s).

CLINICAL DATA: Screening.

EXAM:
DIGITAL SCREENING BILATERAL MAMMOGRAM WITH TOMOSYNTHESIS AND CAD
TECHNIQUE: Bilateral screening digital craniocaudal and mediolateral oblique
mammograms were obtained. Bilateral screening digital breast
tomosynthesis was performed. The images were evaluated with
computer-aided detection.

[L MLO synth-2D]
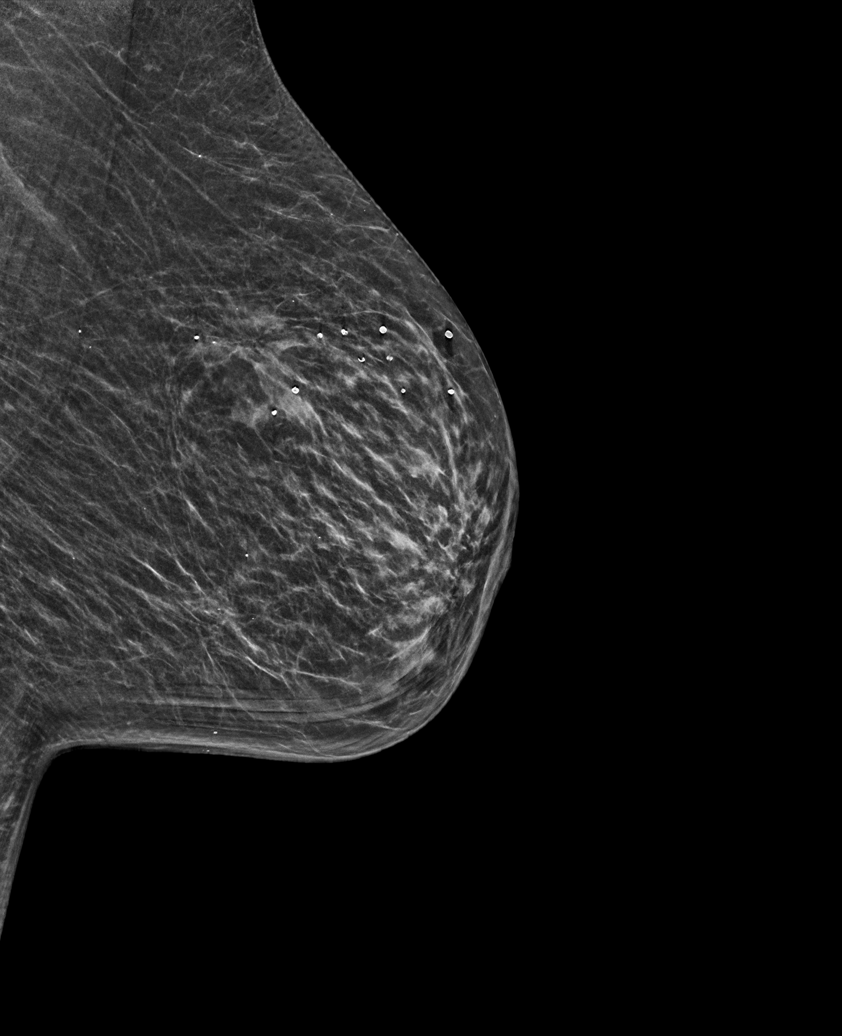

[R CC synth-2D]
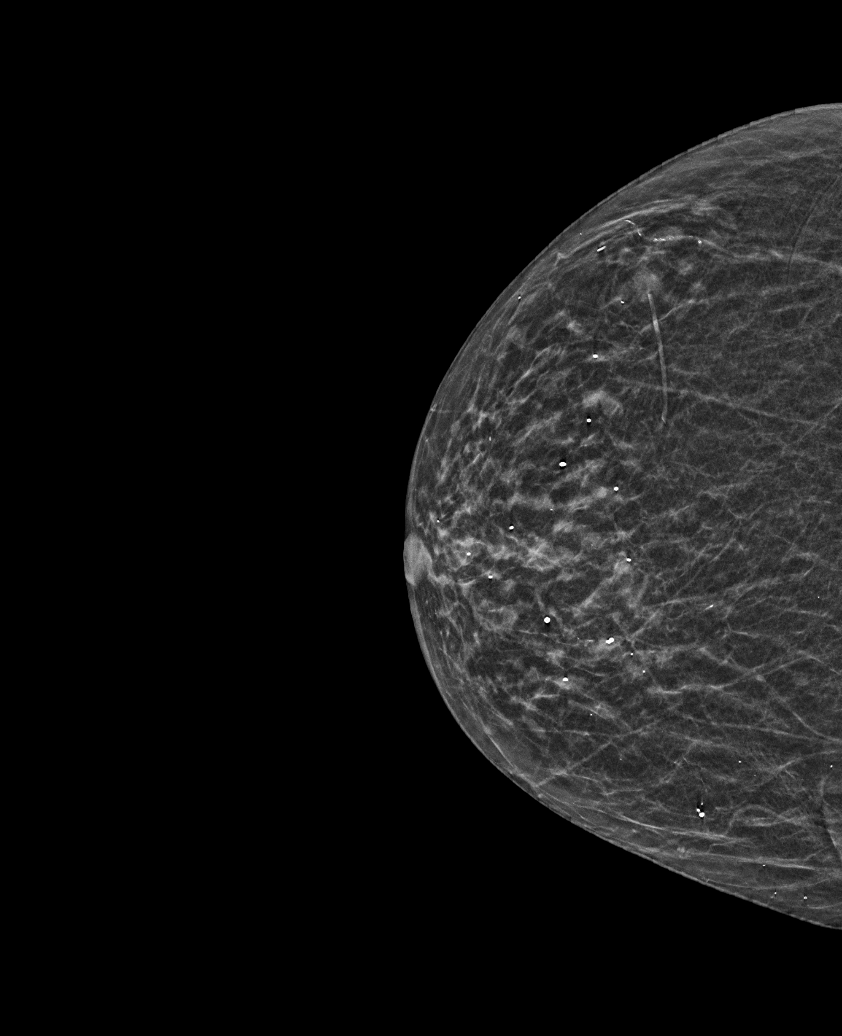

[R MLO synth-2D]
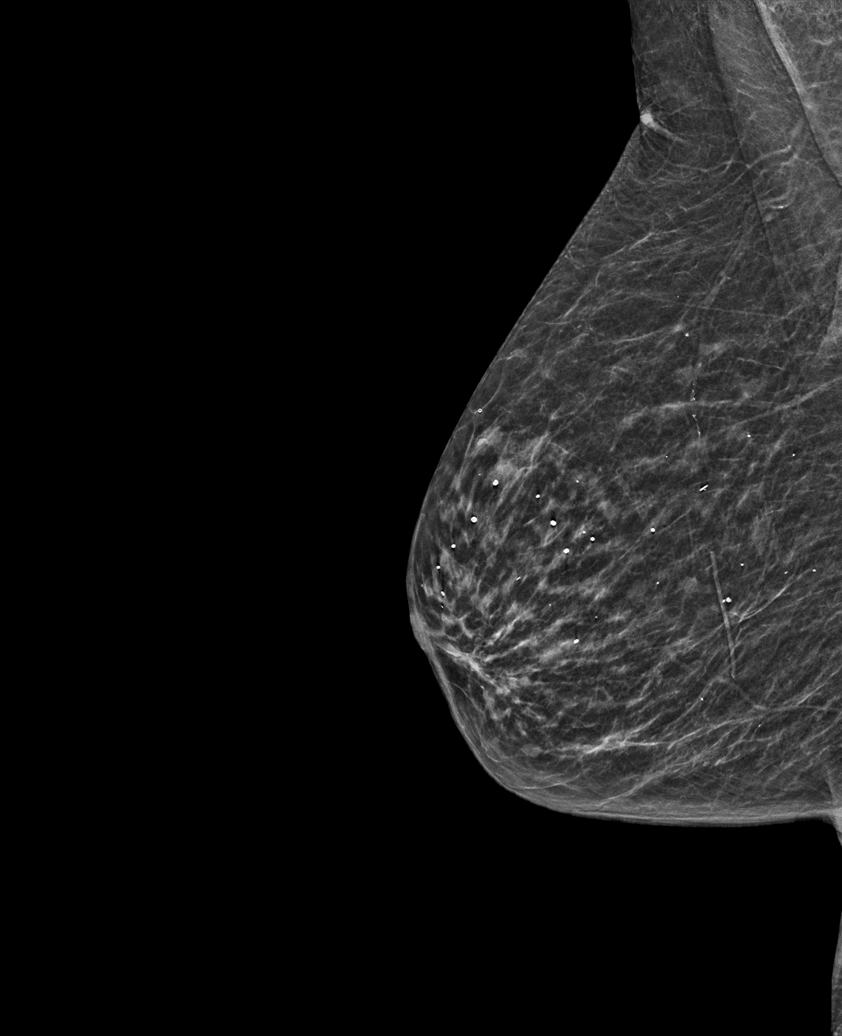

[L CC synth-2D (1 of 2)]
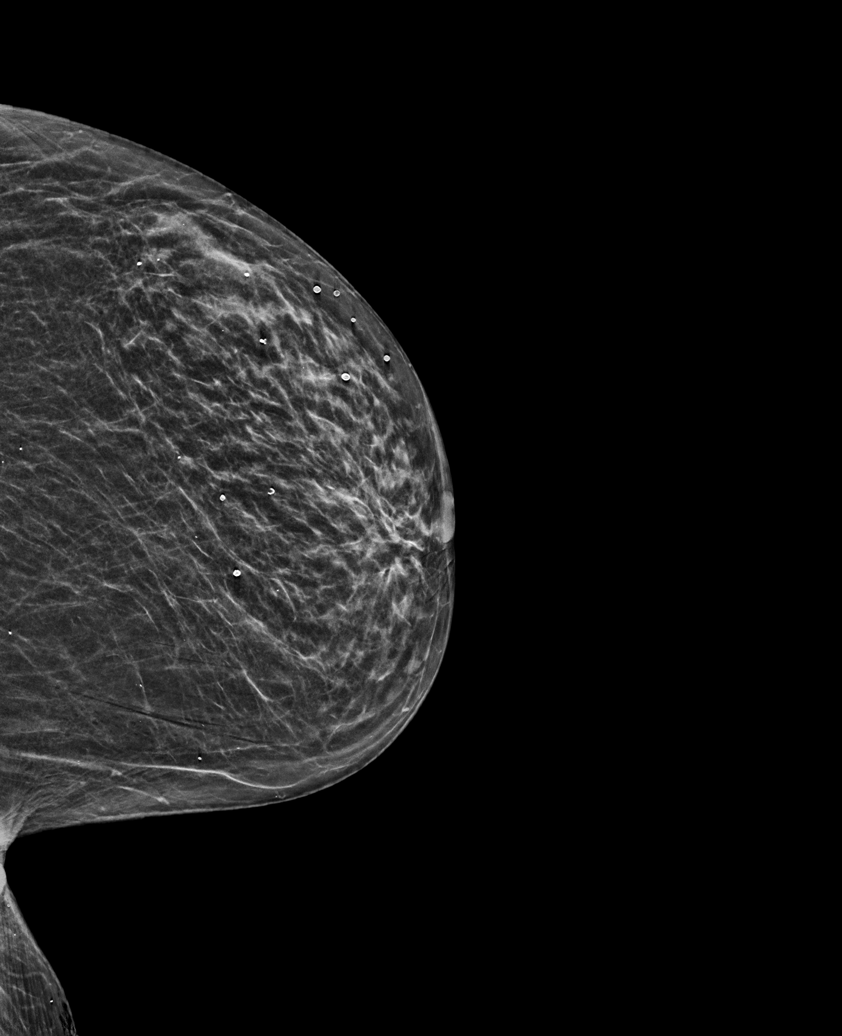

[L CC synth-2D (2 of 2)]
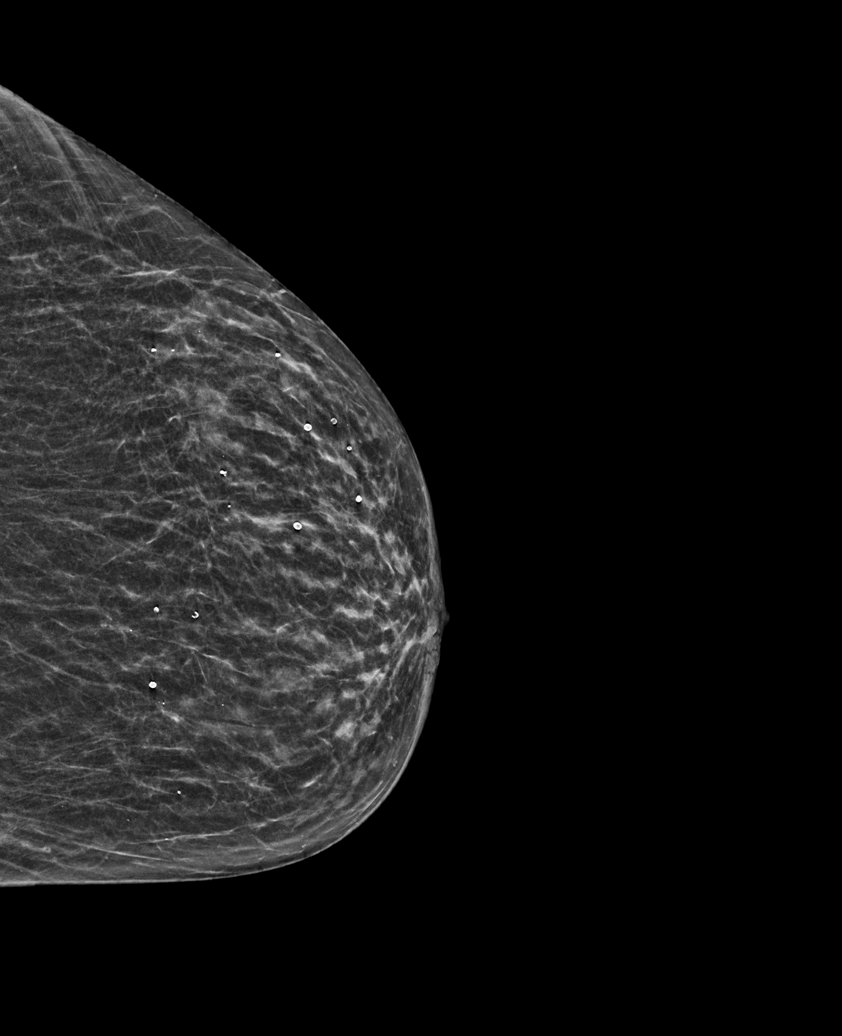

[L MLO tomo · tomo slice 24/47.0]
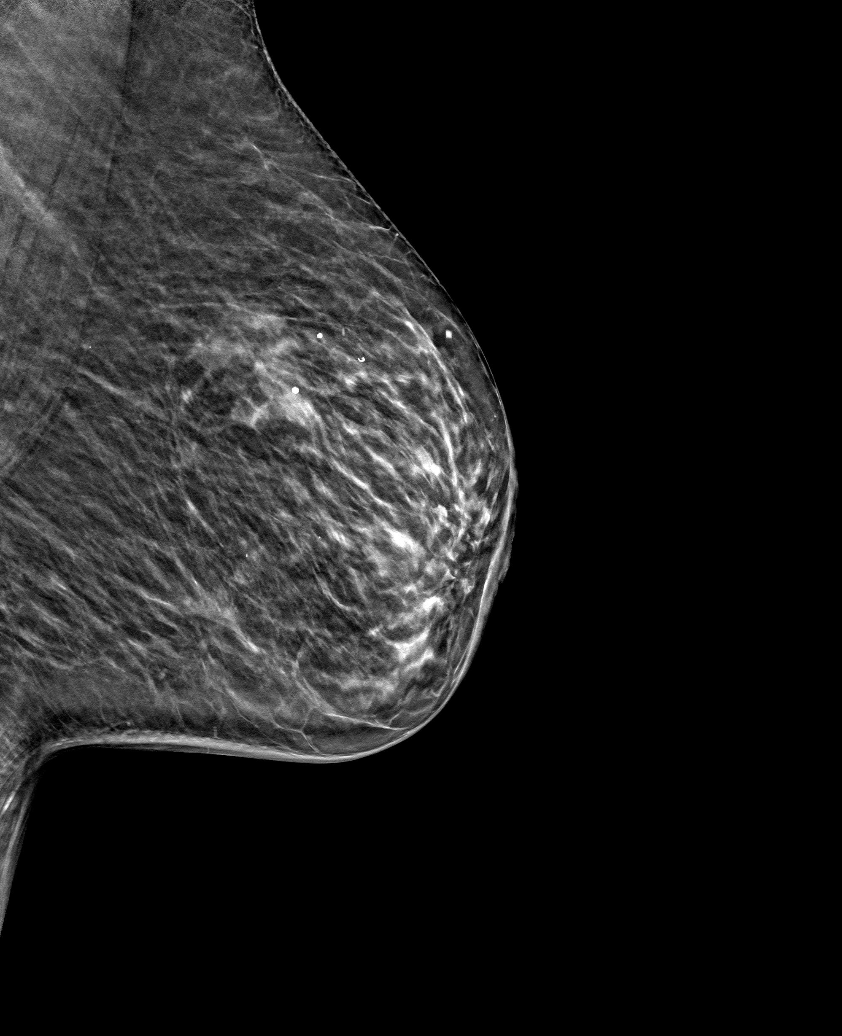

[6 of 30 positions shown; findings below may reference images not displayed]

ACR Breast Density Category b: There are scattered areas of
fibroglandular density.
FINDINGS: There are no findings suspicious for malignancy.
IMPRESSION: No mammographic evidence of malignancy. A result letter of this
screening mammogram will be mailed directly to the patient.

RECOMMENDATION:
Screening mammogram in one year. (Code:[BY])

BI-RADS CATEGORY  1: Negative.

## 2021-07-25 DIAGNOSIS — Z23 Encounter for immunization: Secondary | ICD-10-CM | POA: Diagnosis not present

## 2021-08-11 DIAGNOSIS — Z862 Personal history of diseases of the blood and blood-forming organs and certain disorders involving the immune mechanism: Secondary | ICD-10-CM | POA: Diagnosis not present

## 2021-08-11 DIAGNOSIS — I10 Essential (primary) hypertension: Secondary | ICD-10-CM | POA: Diagnosis not present

## 2021-08-11 DIAGNOSIS — E1169 Type 2 diabetes mellitus with other specified complication: Secondary | ICD-10-CM | POA: Diagnosis not present

## 2021-08-11 DIAGNOSIS — E785 Hyperlipidemia, unspecified: Secondary | ICD-10-CM | POA: Diagnosis not present

## 2021-08-11 DIAGNOSIS — E7800 Pure hypercholesterolemia, unspecified: Secondary | ICD-10-CM | POA: Diagnosis not present

## 2021-08-24 DIAGNOSIS — M47812 Spondylosis without myelopathy or radiculopathy, cervical region: Secondary | ICD-10-CM | POA: Diagnosis not present

## 2021-08-24 DIAGNOSIS — R7303 Prediabetes: Secondary | ICD-10-CM | POA: Diagnosis not present

## 2021-08-24 DIAGNOSIS — Z0000 Encounter for general adult medical examination without abnormal findings: Secondary | ICD-10-CM | POA: Diagnosis not present

## 2021-08-24 DIAGNOSIS — Z1389 Encounter for screening for other disorder: Secondary | ICD-10-CM | POA: Diagnosis not present

## 2021-08-24 DIAGNOSIS — M546 Pain in thoracic spine: Secondary | ICD-10-CM | POA: Diagnosis not present

## 2021-08-24 DIAGNOSIS — E785 Hyperlipidemia, unspecified: Secondary | ICD-10-CM | POA: Diagnosis not present

## 2021-08-24 DIAGNOSIS — Z862 Personal history of diseases of the blood and blood-forming organs and certain disorders involving the immune mechanism: Secondary | ICD-10-CM | POA: Diagnosis not present

## 2021-08-24 DIAGNOSIS — M4312 Spondylolisthesis, cervical region: Secondary | ICD-10-CM | POA: Diagnosis not present

## 2021-08-24 DIAGNOSIS — M4692 Unspecified inflammatory spondylopathy, cervical region: Secondary | ICD-10-CM | POA: Diagnosis not present

## 2021-08-24 DIAGNOSIS — Z9181 History of falling: Secondary | ICD-10-CM | POA: Diagnosis not present

## 2021-08-24 DIAGNOSIS — M542 Cervicalgia: Secondary | ICD-10-CM | POA: Diagnosis not present

## 2021-08-24 DIAGNOSIS — M25551 Pain in right hip: Secondary | ICD-10-CM | POA: Diagnosis not present

## 2021-09-05 DIAGNOSIS — R2681 Unsteadiness on feet: Secondary | ICD-10-CM | POA: Diagnosis not present

## 2021-09-05 DIAGNOSIS — R2689 Other abnormalities of gait and mobility: Secondary | ICD-10-CM | POA: Diagnosis not present

## 2021-09-05 DIAGNOSIS — Z9181 History of falling: Secondary | ICD-10-CM | POA: Diagnosis not present

## 2021-09-05 DIAGNOSIS — M150 Primary generalized (osteo)arthritis: Secondary | ICD-10-CM | POA: Diagnosis not present

## 2021-09-06 DIAGNOSIS — R2689 Other abnormalities of gait and mobility: Secondary | ICD-10-CM | POA: Diagnosis not present

## 2021-09-06 DIAGNOSIS — M150 Primary generalized (osteo)arthritis: Secondary | ICD-10-CM | POA: Diagnosis not present

## 2021-09-06 DIAGNOSIS — Z9181 History of falling: Secondary | ICD-10-CM | POA: Diagnosis not present

## 2021-09-06 DIAGNOSIS — R2681 Unsteadiness on feet: Secondary | ICD-10-CM | POA: Diagnosis not present

## 2021-09-12 DIAGNOSIS — R41841 Cognitive communication deficit: Secondary | ICD-10-CM | POA: Diagnosis not present

## 2021-09-12 DIAGNOSIS — M150 Primary generalized (osteo)arthritis: Secondary | ICD-10-CM | POA: Diagnosis not present

## 2021-09-12 DIAGNOSIS — R488 Other symbolic dysfunctions: Secondary | ICD-10-CM | POA: Diagnosis not present

## 2021-09-12 DIAGNOSIS — Z9181 History of falling: Secondary | ICD-10-CM | POA: Diagnosis not present

## 2021-09-12 DIAGNOSIS — R2681 Unsteadiness on feet: Secondary | ICD-10-CM | POA: Diagnosis not present

## 2021-09-12 DIAGNOSIS — R2689 Other abnormalities of gait and mobility: Secondary | ICD-10-CM | POA: Diagnosis not present

## 2021-09-12 DIAGNOSIS — M6281 Muscle weakness (generalized): Secondary | ICD-10-CM | POA: Diagnosis not present

## 2021-09-13 DIAGNOSIS — R41841 Cognitive communication deficit: Secondary | ICD-10-CM | POA: Diagnosis not present

## 2021-09-13 DIAGNOSIS — R2689 Other abnormalities of gait and mobility: Secondary | ICD-10-CM | POA: Diagnosis not present

## 2021-09-13 DIAGNOSIS — R2681 Unsteadiness on feet: Secondary | ICD-10-CM | POA: Diagnosis not present

## 2021-09-13 DIAGNOSIS — Z9181 History of falling: Secondary | ICD-10-CM | POA: Diagnosis not present

## 2021-09-13 DIAGNOSIS — M150 Primary generalized (osteo)arthritis: Secondary | ICD-10-CM | POA: Diagnosis not present

## 2021-09-13 DIAGNOSIS — M6281 Muscle weakness (generalized): Secondary | ICD-10-CM | POA: Diagnosis not present

## 2021-09-13 DIAGNOSIS — R488 Other symbolic dysfunctions: Secondary | ICD-10-CM | POA: Diagnosis not present

## 2021-09-14 DIAGNOSIS — Z9181 History of falling: Secondary | ICD-10-CM | POA: Diagnosis not present

## 2021-09-14 DIAGNOSIS — R2681 Unsteadiness on feet: Secondary | ICD-10-CM | POA: Diagnosis not present

## 2021-09-14 DIAGNOSIS — M150 Primary generalized (osteo)arthritis: Secondary | ICD-10-CM | POA: Diagnosis not present

## 2021-09-14 DIAGNOSIS — R41841 Cognitive communication deficit: Secondary | ICD-10-CM | POA: Diagnosis not present

## 2021-09-14 DIAGNOSIS — R488 Other symbolic dysfunctions: Secondary | ICD-10-CM | POA: Diagnosis not present

## 2021-09-14 DIAGNOSIS — R2689 Other abnormalities of gait and mobility: Secondary | ICD-10-CM | POA: Diagnosis not present

## 2021-09-14 DIAGNOSIS — M6281 Muscle weakness (generalized): Secondary | ICD-10-CM | POA: Diagnosis not present

## 2021-09-15 DIAGNOSIS — M6281 Muscle weakness (generalized): Secondary | ICD-10-CM | POA: Diagnosis not present

## 2021-09-15 DIAGNOSIS — Z9181 History of falling: Secondary | ICD-10-CM | POA: Diagnosis not present

## 2021-09-15 DIAGNOSIS — M150 Primary generalized (osteo)arthritis: Secondary | ICD-10-CM | POA: Diagnosis not present

## 2021-09-15 DIAGNOSIS — R2681 Unsteadiness on feet: Secondary | ICD-10-CM | POA: Diagnosis not present

## 2021-09-15 DIAGNOSIS — R2689 Other abnormalities of gait and mobility: Secondary | ICD-10-CM | POA: Diagnosis not present

## 2021-09-15 DIAGNOSIS — R41841 Cognitive communication deficit: Secondary | ICD-10-CM | POA: Diagnosis not present

## 2021-09-15 DIAGNOSIS — R488 Other symbolic dysfunctions: Secondary | ICD-10-CM | POA: Diagnosis not present

## 2021-09-18 DIAGNOSIS — M150 Primary generalized (osteo)arthritis: Secondary | ICD-10-CM | POA: Diagnosis not present

## 2021-09-18 DIAGNOSIS — R488 Other symbolic dysfunctions: Secondary | ICD-10-CM | POA: Diagnosis not present

## 2021-09-18 DIAGNOSIS — R2681 Unsteadiness on feet: Secondary | ICD-10-CM | POA: Diagnosis not present

## 2021-09-18 DIAGNOSIS — R2689 Other abnormalities of gait and mobility: Secondary | ICD-10-CM | POA: Diagnosis not present

## 2021-09-18 DIAGNOSIS — R41841 Cognitive communication deficit: Secondary | ICD-10-CM | POA: Diagnosis not present

## 2021-09-18 DIAGNOSIS — M6281 Muscle weakness (generalized): Secondary | ICD-10-CM | POA: Diagnosis not present

## 2021-09-18 DIAGNOSIS — Z9181 History of falling: Secondary | ICD-10-CM | POA: Diagnosis not present

## 2021-09-19 DIAGNOSIS — M6281 Muscle weakness (generalized): Secondary | ICD-10-CM | POA: Diagnosis not present

## 2021-09-19 DIAGNOSIS — R488 Other symbolic dysfunctions: Secondary | ICD-10-CM | POA: Diagnosis not present

## 2021-09-19 DIAGNOSIS — M150 Primary generalized (osteo)arthritis: Secondary | ICD-10-CM | POA: Diagnosis not present

## 2021-09-19 DIAGNOSIS — Z9181 History of falling: Secondary | ICD-10-CM | POA: Diagnosis not present

## 2021-09-19 DIAGNOSIS — R2689 Other abnormalities of gait and mobility: Secondary | ICD-10-CM | POA: Diagnosis not present

## 2021-09-19 DIAGNOSIS — R41841 Cognitive communication deficit: Secondary | ICD-10-CM | POA: Diagnosis not present

## 2021-09-19 DIAGNOSIS — R2681 Unsteadiness on feet: Secondary | ICD-10-CM | POA: Diagnosis not present

## 2021-09-20 DIAGNOSIS — R2689 Other abnormalities of gait and mobility: Secondary | ICD-10-CM | POA: Diagnosis not present

## 2021-09-20 DIAGNOSIS — M6281 Muscle weakness (generalized): Secondary | ICD-10-CM | POA: Diagnosis not present

## 2021-09-20 DIAGNOSIS — Z9181 History of falling: Secondary | ICD-10-CM | POA: Diagnosis not present

## 2021-09-20 DIAGNOSIS — M150 Primary generalized (osteo)arthritis: Secondary | ICD-10-CM | POA: Diagnosis not present

## 2021-09-20 DIAGNOSIS — R41841 Cognitive communication deficit: Secondary | ICD-10-CM | POA: Diagnosis not present

## 2021-09-20 DIAGNOSIS — R2681 Unsteadiness on feet: Secondary | ICD-10-CM | POA: Diagnosis not present

## 2021-09-20 DIAGNOSIS — R488 Other symbolic dysfunctions: Secondary | ICD-10-CM | POA: Diagnosis not present

## 2021-09-21 DIAGNOSIS — R2681 Unsteadiness on feet: Secondary | ICD-10-CM | POA: Diagnosis not present

## 2021-09-21 DIAGNOSIS — Z9181 History of falling: Secondary | ICD-10-CM | POA: Diagnosis not present

## 2021-09-21 DIAGNOSIS — R488 Other symbolic dysfunctions: Secondary | ICD-10-CM | POA: Diagnosis not present

## 2021-09-21 DIAGNOSIS — R2689 Other abnormalities of gait and mobility: Secondary | ICD-10-CM | POA: Diagnosis not present

## 2021-09-21 DIAGNOSIS — M6281 Muscle weakness (generalized): Secondary | ICD-10-CM | POA: Diagnosis not present

## 2021-09-21 DIAGNOSIS — M150 Primary generalized (osteo)arthritis: Secondary | ICD-10-CM | POA: Diagnosis not present

## 2021-09-21 DIAGNOSIS — R41841 Cognitive communication deficit: Secondary | ICD-10-CM | POA: Diagnosis not present

## 2021-09-22 DIAGNOSIS — R488 Other symbolic dysfunctions: Secondary | ICD-10-CM | POA: Diagnosis not present

## 2021-09-22 DIAGNOSIS — R41841 Cognitive communication deficit: Secondary | ICD-10-CM | POA: Diagnosis not present

## 2021-09-22 DIAGNOSIS — R2689 Other abnormalities of gait and mobility: Secondary | ICD-10-CM | POA: Diagnosis not present

## 2021-09-22 DIAGNOSIS — R2681 Unsteadiness on feet: Secondary | ICD-10-CM | POA: Diagnosis not present

## 2021-09-22 DIAGNOSIS — M6281 Muscle weakness (generalized): Secondary | ICD-10-CM | POA: Diagnosis not present

## 2021-09-22 DIAGNOSIS — M150 Primary generalized (osteo)arthritis: Secondary | ICD-10-CM | POA: Diagnosis not present

## 2021-09-22 DIAGNOSIS — Z9181 History of falling: Secondary | ICD-10-CM | POA: Diagnosis not present

## 2021-09-25 DIAGNOSIS — I4819 Other persistent atrial fibrillation: Secondary | ICD-10-CM | POA: Diagnosis not present

## 2021-09-25 DIAGNOSIS — I480 Paroxysmal atrial fibrillation: Secondary | ICD-10-CM | POA: Diagnosis not present

## 2021-09-25 DIAGNOSIS — I10 Essential (primary) hypertension: Secondary | ICD-10-CM | POA: Diagnosis not present

## 2021-09-25 DIAGNOSIS — E7800 Pure hypercholesterolemia, unspecified: Secondary | ICD-10-CM | POA: Diagnosis not present

## 2021-09-25 DIAGNOSIS — I340 Nonrheumatic mitral (valve) insufficiency: Secondary | ICD-10-CM | POA: Diagnosis not present

## 2021-09-26 DIAGNOSIS — M6281 Muscle weakness (generalized): Secondary | ICD-10-CM | POA: Diagnosis not present

## 2021-09-26 DIAGNOSIS — R2681 Unsteadiness on feet: Secondary | ICD-10-CM | POA: Diagnosis not present

## 2021-09-26 DIAGNOSIS — R488 Other symbolic dysfunctions: Secondary | ICD-10-CM | POA: Diagnosis not present

## 2021-09-26 DIAGNOSIS — R41841 Cognitive communication deficit: Secondary | ICD-10-CM | POA: Diagnosis not present

## 2021-09-26 DIAGNOSIS — R2689 Other abnormalities of gait and mobility: Secondary | ICD-10-CM | POA: Diagnosis not present

## 2021-09-26 DIAGNOSIS — M150 Primary generalized (osteo)arthritis: Secondary | ICD-10-CM | POA: Diagnosis not present

## 2021-09-26 DIAGNOSIS — Z9181 History of falling: Secondary | ICD-10-CM | POA: Diagnosis not present

## 2021-09-27 DIAGNOSIS — R488 Other symbolic dysfunctions: Secondary | ICD-10-CM | POA: Diagnosis not present

## 2021-09-27 DIAGNOSIS — Z9181 History of falling: Secondary | ICD-10-CM | POA: Diagnosis not present

## 2021-09-27 DIAGNOSIS — R2689 Other abnormalities of gait and mobility: Secondary | ICD-10-CM | POA: Diagnosis not present

## 2021-09-27 DIAGNOSIS — R41841 Cognitive communication deficit: Secondary | ICD-10-CM | POA: Diagnosis not present

## 2021-09-27 DIAGNOSIS — M6281 Muscle weakness (generalized): Secondary | ICD-10-CM | POA: Diagnosis not present

## 2021-09-27 DIAGNOSIS — R2681 Unsteadiness on feet: Secondary | ICD-10-CM | POA: Diagnosis not present

## 2021-09-27 DIAGNOSIS — M150 Primary generalized (osteo)arthritis: Secondary | ICD-10-CM | POA: Diagnosis not present

## 2021-09-28 DIAGNOSIS — M6281 Muscle weakness (generalized): Secondary | ICD-10-CM | POA: Diagnosis not present

## 2021-09-28 DIAGNOSIS — R41841 Cognitive communication deficit: Secondary | ICD-10-CM | POA: Diagnosis not present

## 2021-09-28 DIAGNOSIS — R2681 Unsteadiness on feet: Secondary | ICD-10-CM | POA: Diagnosis not present

## 2021-09-28 DIAGNOSIS — R2689 Other abnormalities of gait and mobility: Secondary | ICD-10-CM | POA: Diagnosis not present

## 2021-09-28 DIAGNOSIS — R488 Other symbolic dysfunctions: Secondary | ICD-10-CM | POA: Diagnosis not present

## 2021-09-28 DIAGNOSIS — Z9181 History of falling: Secondary | ICD-10-CM | POA: Diagnosis not present

## 2021-09-28 DIAGNOSIS — M150 Primary generalized (osteo)arthritis: Secondary | ICD-10-CM | POA: Diagnosis not present

## 2021-09-29 DIAGNOSIS — R2689 Other abnormalities of gait and mobility: Secondary | ICD-10-CM | POA: Diagnosis not present

## 2021-09-29 DIAGNOSIS — M6281 Muscle weakness (generalized): Secondary | ICD-10-CM | POA: Diagnosis not present

## 2021-09-29 DIAGNOSIS — R41841 Cognitive communication deficit: Secondary | ICD-10-CM | POA: Diagnosis not present

## 2021-09-29 DIAGNOSIS — R2681 Unsteadiness on feet: Secondary | ICD-10-CM | POA: Diagnosis not present

## 2021-09-29 DIAGNOSIS — R488 Other symbolic dysfunctions: Secondary | ICD-10-CM | POA: Diagnosis not present

## 2021-09-29 DIAGNOSIS — Z9181 History of falling: Secondary | ICD-10-CM | POA: Diagnosis not present

## 2021-09-29 DIAGNOSIS — M150 Primary generalized (osteo)arthritis: Secondary | ICD-10-CM | POA: Diagnosis not present

## 2021-10-02 DIAGNOSIS — R41841 Cognitive communication deficit: Secondary | ICD-10-CM | POA: Diagnosis not present

## 2021-10-02 DIAGNOSIS — M6281 Muscle weakness (generalized): Secondary | ICD-10-CM | POA: Diagnosis not present

## 2021-10-02 DIAGNOSIS — Z9181 History of falling: Secondary | ICD-10-CM | POA: Diagnosis not present

## 2021-10-02 DIAGNOSIS — R2689 Other abnormalities of gait and mobility: Secondary | ICD-10-CM | POA: Diagnosis not present

## 2021-10-02 DIAGNOSIS — M150 Primary generalized (osteo)arthritis: Secondary | ICD-10-CM | POA: Diagnosis not present

## 2021-10-02 DIAGNOSIS — R488 Other symbolic dysfunctions: Secondary | ICD-10-CM | POA: Diagnosis not present

## 2021-10-02 DIAGNOSIS — R2681 Unsteadiness on feet: Secondary | ICD-10-CM | POA: Diagnosis not present

## 2021-10-04 DIAGNOSIS — M6281 Muscle weakness (generalized): Secondary | ICD-10-CM | POA: Diagnosis not present

## 2021-10-04 DIAGNOSIS — R488 Other symbolic dysfunctions: Secondary | ICD-10-CM | POA: Diagnosis not present

## 2021-10-04 DIAGNOSIS — M150 Primary generalized (osteo)arthritis: Secondary | ICD-10-CM | POA: Diagnosis not present

## 2021-10-04 DIAGNOSIS — R2689 Other abnormalities of gait and mobility: Secondary | ICD-10-CM | POA: Diagnosis not present

## 2021-10-04 DIAGNOSIS — R41841 Cognitive communication deficit: Secondary | ICD-10-CM | POA: Diagnosis not present

## 2021-10-04 DIAGNOSIS — Z9181 History of falling: Secondary | ICD-10-CM | POA: Diagnosis not present

## 2021-10-04 DIAGNOSIS — R2681 Unsteadiness on feet: Secondary | ICD-10-CM | POA: Diagnosis not present

## 2021-10-06 DIAGNOSIS — R41841 Cognitive communication deficit: Secondary | ICD-10-CM | POA: Diagnosis not present

## 2021-10-06 DIAGNOSIS — R2689 Other abnormalities of gait and mobility: Secondary | ICD-10-CM | POA: Diagnosis not present

## 2021-10-06 DIAGNOSIS — M6281 Muscle weakness (generalized): Secondary | ICD-10-CM | POA: Diagnosis not present

## 2021-10-06 DIAGNOSIS — R488 Other symbolic dysfunctions: Secondary | ICD-10-CM | POA: Diagnosis not present

## 2021-10-06 DIAGNOSIS — M150 Primary generalized (osteo)arthritis: Secondary | ICD-10-CM | POA: Diagnosis not present

## 2021-10-06 DIAGNOSIS — Z9181 History of falling: Secondary | ICD-10-CM | POA: Diagnosis not present

## 2021-10-06 DIAGNOSIS — R2681 Unsteadiness on feet: Secondary | ICD-10-CM | POA: Diagnosis not present

## 2021-10-09 DIAGNOSIS — M150 Primary generalized (osteo)arthritis: Secondary | ICD-10-CM | POA: Diagnosis not present

## 2021-10-09 DIAGNOSIS — R488 Other symbolic dysfunctions: Secondary | ICD-10-CM | POA: Diagnosis not present

## 2021-10-09 DIAGNOSIS — R2681 Unsteadiness on feet: Secondary | ICD-10-CM | POA: Diagnosis not present

## 2021-10-09 DIAGNOSIS — Z9181 History of falling: Secondary | ICD-10-CM | POA: Diagnosis not present

## 2021-10-09 DIAGNOSIS — R2689 Other abnormalities of gait and mobility: Secondary | ICD-10-CM | POA: Diagnosis not present

## 2021-10-09 DIAGNOSIS — M6281 Muscle weakness (generalized): Secondary | ICD-10-CM | POA: Diagnosis not present

## 2021-10-09 DIAGNOSIS — R41841 Cognitive communication deficit: Secondary | ICD-10-CM | POA: Diagnosis not present

## 2021-10-10 DIAGNOSIS — M6281 Muscle weakness (generalized): Secondary | ICD-10-CM | POA: Diagnosis not present

## 2021-10-10 DIAGNOSIS — Z9181 History of falling: Secondary | ICD-10-CM | POA: Diagnosis not present

## 2021-10-10 DIAGNOSIS — R488 Other symbolic dysfunctions: Secondary | ICD-10-CM | POA: Diagnosis not present

## 2021-10-10 DIAGNOSIS — R2681 Unsteadiness on feet: Secondary | ICD-10-CM | POA: Diagnosis not present

## 2021-10-10 DIAGNOSIS — R2689 Other abnormalities of gait and mobility: Secondary | ICD-10-CM | POA: Diagnosis not present

## 2021-10-10 DIAGNOSIS — R41841 Cognitive communication deficit: Secondary | ICD-10-CM | POA: Diagnosis not present

## 2021-10-10 DIAGNOSIS — M150 Primary generalized (osteo)arthritis: Secondary | ICD-10-CM | POA: Diagnosis not present

## 2021-10-11 DIAGNOSIS — R2681 Unsteadiness on feet: Secondary | ICD-10-CM | POA: Diagnosis not present

## 2021-10-11 DIAGNOSIS — M6281 Muscle weakness (generalized): Secondary | ICD-10-CM | POA: Diagnosis not present

## 2021-10-11 DIAGNOSIS — R488 Other symbolic dysfunctions: Secondary | ICD-10-CM | POA: Diagnosis not present

## 2021-10-11 DIAGNOSIS — R2689 Other abnormalities of gait and mobility: Secondary | ICD-10-CM | POA: Diagnosis not present

## 2021-10-11 DIAGNOSIS — M150 Primary generalized (osteo)arthritis: Secondary | ICD-10-CM | POA: Diagnosis not present

## 2021-10-11 DIAGNOSIS — R41841 Cognitive communication deficit: Secondary | ICD-10-CM | POA: Diagnosis not present

## 2021-10-11 DIAGNOSIS — Z9181 History of falling: Secondary | ICD-10-CM | POA: Diagnosis not present

## 2021-10-12 DIAGNOSIS — M6281 Muscle weakness (generalized): Secondary | ICD-10-CM | POA: Diagnosis not present

## 2021-10-12 DIAGNOSIS — R2689 Other abnormalities of gait and mobility: Secondary | ICD-10-CM | POA: Diagnosis not present

## 2021-10-12 DIAGNOSIS — M150 Primary generalized (osteo)arthritis: Secondary | ICD-10-CM | POA: Diagnosis not present

## 2021-10-12 DIAGNOSIS — R2681 Unsteadiness on feet: Secondary | ICD-10-CM | POA: Diagnosis not present

## 2021-10-12 DIAGNOSIS — R41841 Cognitive communication deficit: Secondary | ICD-10-CM | POA: Diagnosis not present

## 2021-10-12 DIAGNOSIS — R488 Other symbolic dysfunctions: Secondary | ICD-10-CM | POA: Diagnosis not present

## 2021-10-12 DIAGNOSIS — Z9181 History of falling: Secondary | ICD-10-CM | POA: Diagnosis not present

## 2021-10-13 DIAGNOSIS — M150 Primary generalized (osteo)arthritis: Secondary | ICD-10-CM | POA: Diagnosis not present

## 2021-10-13 DIAGNOSIS — R2689 Other abnormalities of gait and mobility: Secondary | ICD-10-CM | POA: Diagnosis not present

## 2021-10-13 DIAGNOSIS — R41841 Cognitive communication deficit: Secondary | ICD-10-CM | POA: Diagnosis not present

## 2021-10-13 DIAGNOSIS — Z9181 History of falling: Secondary | ICD-10-CM | POA: Diagnosis not present

## 2021-10-13 DIAGNOSIS — R488 Other symbolic dysfunctions: Secondary | ICD-10-CM | POA: Diagnosis not present

## 2021-10-13 DIAGNOSIS — R2681 Unsteadiness on feet: Secondary | ICD-10-CM | POA: Diagnosis not present

## 2021-10-13 DIAGNOSIS — M6281 Muscle weakness (generalized): Secondary | ICD-10-CM | POA: Diagnosis not present

## 2021-10-16 DIAGNOSIS — R2689 Other abnormalities of gait and mobility: Secondary | ICD-10-CM | POA: Diagnosis not present

## 2021-10-16 DIAGNOSIS — R488 Other symbolic dysfunctions: Secondary | ICD-10-CM | POA: Diagnosis not present

## 2021-10-16 DIAGNOSIS — M6281 Muscle weakness (generalized): Secondary | ICD-10-CM | POA: Diagnosis not present

## 2021-10-16 DIAGNOSIS — R41841 Cognitive communication deficit: Secondary | ICD-10-CM | POA: Diagnosis not present

## 2021-10-16 DIAGNOSIS — R2681 Unsteadiness on feet: Secondary | ICD-10-CM | POA: Diagnosis not present

## 2021-10-16 DIAGNOSIS — Z9181 History of falling: Secondary | ICD-10-CM | POA: Diagnosis not present

## 2021-10-16 DIAGNOSIS — M150 Primary generalized (osteo)arthritis: Secondary | ICD-10-CM | POA: Diagnosis not present

## 2021-10-17 DIAGNOSIS — R488 Other symbolic dysfunctions: Secondary | ICD-10-CM | POA: Diagnosis not present

## 2021-10-17 DIAGNOSIS — R2689 Other abnormalities of gait and mobility: Secondary | ICD-10-CM | POA: Diagnosis not present

## 2021-10-17 DIAGNOSIS — R2681 Unsteadiness on feet: Secondary | ICD-10-CM | POA: Diagnosis not present

## 2021-10-17 DIAGNOSIS — M6281 Muscle weakness (generalized): Secondary | ICD-10-CM | POA: Diagnosis not present

## 2021-10-17 DIAGNOSIS — Z9181 History of falling: Secondary | ICD-10-CM | POA: Diagnosis not present

## 2021-10-17 DIAGNOSIS — R41841 Cognitive communication deficit: Secondary | ICD-10-CM | POA: Diagnosis not present

## 2021-10-17 DIAGNOSIS — M150 Primary generalized (osteo)arthritis: Secondary | ICD-10-CM | POA: Diagnosis not present

## 2021-10-19 DIAGNOSIS — R2681 Unsteadiness on feet: Secondary | ICD-10-CM | POA: Diagnosis not present

## 2021-10-19 DIAGNOSIS — R2689 Other abnormalities of gait and mobility: Secondary | ICD-10-CM | POA: Diagnosis not present

## 2021-10-19 DIAGNOSIS — Z9181 History of falling: Secondary | ICD-10-CM | POA: Diagnosis not present

## 2021-10-19 DIAGNOSIS — M150 Primary generalized (osteo)arthritis: Secondary | ICD-10-CM | POA: Diagnosis not present

## 2021-10-19 DIAGNOSIS — R488 Other symbolic dysfunctions: Secondary | ICD-10-CM | POA: Diagnosis not present

## 2021-10-19 DIAGNOSIS — R41841 Cognitive communication deficit: Secondary | ICD-10-CM | POA: Diagnosis not present

## 2021-10-19 DIAGNOSIS — M6281 Muscle weakness (generalized): Secondary | ICD-10-CM | POA: Diagnosis not present

## 2021-10-20 DIAGNOSIS — M150 Primary generalized (osteo)arthritis: Secondary | ICD-10-CM | POA: Diagnosis not present

## 2021-10-20 DIAGNOSIS — R2689 Other abnormalities of gait and mobility: Secondary | ICD-10-CM | POA: Diagnosis not present

## 2021-10-20 DIAGNOSIS — R2681 Unsteadiness on feet: Secondary | ICD-10-CM | POA: Diagnosis not present

## 2021-10-20 DIAGNOSIS — R488 Other symbolic dysfunctions: Secondary | ICD-10-CM | POA: Diagnosis not present

## 2021-10-20 DIAGNOSIS — R41841 Cognitive communication deficit: Secondary | ICD-10-CM | POA: Diagnosis not present

## 2021-10-20 DIAGNOSIS — M6281 Muscle weakness (generalized): Secondary | ICD-10-CM | POA: Diagnosis not present

## 2021-10-20 DIAGNOSIS — Z9181 History of falling: Secondary | ICD-10-CM | POA: Diagnosis not present

## 2021-10-23 DIAGNOSIS — R488 Other symbolic dysfunctions: Secondary | ICD-10-CM | POA: Diagnosis not present

## 2021-10-23 DIAGNOSIS — E113212 Type 2 diabetes mellitus with mild nonproliferative diabetic retinopathy with macular edema, left eye: Secondary | ICD-10-CM | POA: Diagnosis not present

## 2021-10-23 DIAGNOSIS — Z9181 History of falling: Secondary | ICD-10-CM | POA: Diagnosis not present

## 2021-10-23 DIAGNOSIS — R2689 Other abnormalities of gait and mobility: Secondary | ICD-10-CM | POA: Diagnosis not present

## 2021-10-23 DIAGNOSIS — R41841 Cognitive communication deficit: Secondary | ICD-10-CM | POA: Diagnosis not present

## 2021-10-23 DIAGNOSIS — M150 Primary generalized (osteo)arthritis: Secondary | ICD-10-CM | POA: Diagnosis not present

## 2021-10-23 DIAGNOSIS — M6281 Muscle weakness (generalized): Secondary | ICD-10-CM | POA: Diagnosis not present

## 2021-10-23 DIAGNOSIS — R2681 Unsteadiness on feet: Secondary | ICD-10-CM | POA: Diagnosis not present

## 2021-10-24 DIAGNOSIS — M150 Primary generalized (osteo)arthritis: Secondary | ICD-10-CM | POA: Diagnosis not present

## 2021-10-24 DIAGNOSIS — Z9181 History of falling: Secondary | ICD-10-CM | POA: Diagnosis not present

## 2021-10-24 DIAGNOSIS — R2681 Unsteadiness on feet: Secondary | ICD-10-CM | POA: Diagnosis not present

## 2021-10-24 DIAGNOSIS — R41841 Cognitive communication deficit: Secondary | ICD-10-CM | POA: Diagnosis not present

## 2021-10-24 DIAGNOSIS — R488 Other symbolic dysfunctions: Secondary | ICD-10-CM | POA: Diagnosis not present

## 2021-10-24 DIAGNOSIS — M6281 Muscle weakness (generalized): Secondary | ICD-10-CM | POA: Diagnosis not present

## 2021-10-24 DIAGNOSIS — R2689 Other abnormalities of gait and mobility: Secondary | ICD-10-CM | POA: Diagnosis not present

## 2021-10-25 DIAGNOSIS — R2689 Other abnormalities of gait and mobility: Secondary | ICD-10-CM | POA: Diagnosis not present

## 2021-10-25 DIAGNOSIS — M150 Primary generalized (osteo)arthritis: Secondary | ICD-10-CM | POA: Diagnosis not present

## 2021-10-25 DIAGNOSIS — M6281 Muscle weakness (generalized): Secondary | ICD-10-CM | POA: Diagnosis not present

## 2021-10-25 DIAGNOSIS — R488 Other symbolic dysfunctions: Secondary | ICD-10-CM | POA: Diagnosis not present

## 2021-10-25 DIAGNOSIS — R41841 Cognitive communication deficit: Secondary | ICD-10-CM | POA: Diagnosis not present

## 2021-10-25 DIAGNOSIS — Z9181 History of falling: Secondary | ICD-10-CM | POA: Diagnosis not present

## 2021-10-25 DIAGNOSIS — R2681 Unsteadiness on feet: Secondary | ICD-10-CM | POA: Diagnosis not present

## 2021-10-26 DIAGNOSIS — M6281 Muscle weakness (generalized): Secondary | ICD-10-CM | POA: Diagnosis not present

## 2021-10-26 DIAGNOSIS — R488 Other symbolic dysfunctions: Secondary | ICD-10-CM | POA: Diagnosis not present

## 2021-10-26 DIAGNOSIS — R2681 Unsteadiness on feet: Secondary | ICD-10-CM | POA: Diagnosis not present

## 2021-10-26 DIAGNOSIS — R41841 Cognitive communication deficit: Secondary | ICD-10-CM | POA: Diagnosis not present

## 2021-10-26 DIAGNOSIS — R2689 Other abnormalities of gait and mobility: Secondary | ICD-10-CM | POA: Diagnosis not present

## 2021-10-26 DIAGNOSIS — M150 Primary generalized (osteo)arthritis: Secondary | ICD-10-CM | POA: Diagnosis not present

## 2021-10-26 DIAGNOSIS — Z9181 History of falling: Secondary | ICD-10-CM | POA: Diagnosis not present

## 2021-10-27 DIAGNOSIS — M6281 Muscle weakness (generalized): Secondary | ICD-10-CM | POA: Diagnosis not present

## 2021-10-27 DIAGNOSIS — M150 Primary generalized (osteo)arthritis: Secondary | ICD-10-CM | POA: Diagnosis not present

## 2021-10-27 DIAGNOSIS — R41841 Cognitive communication deficit: Secondary | ICD-10-CM | POA: Diagnosis not present

## 2021-10-27 DIAGNOSIS — R2681 Unsteadiness on feet: Secondary | ICD-10-CM | POA: Diagnosis not present

## 2021-10-27 DIAGNOSIS — Z9181 History of falling: Secondary | ICD-10-CM | POA: Diagnosis not present

## 2021-10-27 DIAGNOSIS — R488 Other symbolic dysfunctions: Secondary | ICD-10-CM | POA: Diagnosis not present

## 2021-10-27 DIAGNOSIS — R2689 Other abnormalities of gait and mobility: Secondary | ICD-10-CM | POA: Diagnosis not present

## 2021-10-28 DIAGNOSIS — R2689 Other abnormalities of gait and mobility: Secondary | ICD-10-CM | POA: Diagnosis not present

## 2021-10-28 DIAGNOSIS — M6281 Muscle weakness (generalized): Secondary | ICD-10-CM | POA: Diagnosis not present

## 2021-10-28 DIAGNOSIS — R41841 Cognitive communication deficit: Secondary | ICD-10-CM | POA: Diagnosis not present

## 2021-10-28 DIAGNOSIS — M150 Primary generalized (osteo)arthritis: Secondary | ICD-10-CM | POA: Diagnosis not present

## 2021-10-28 DIAGNOSIS — Z9181 History of falling: Secondary | ICD-10-CM | POA: Diagnosis not present

## 2021-10-28 DIAGNOSIS — R488 Other symbolic dysfunctions: Secondary | ICD-10-CM | POA: Diagnosis not present

## 2021-10-28 DIAGNOSIS — R2681 Unsteadiness on feet: Secondary | ICD-10-CM | POA: Diagnosis not present

## 2021-10-30 DIAGNOSIS — M6281 Muscle weakness (generalized): Secondary | ICD-10-CM | POA: Diagnosis not present

## 2021-10-30 DIAGNOSIS — M150 Primary generalized (osteo)arthritis: Secondary | ICD-10-CM | POA: Diagnosis not present

## 2021-10-30 DIAGNOSIS — R2689 Other abnormalities of gait and mobility: Secondary | ICD-10-CM | POA: Diagnosis not present

## 2021-10-30 DIAGNOSIS — Z9181 History of falling: Secondary | ICD-10-CM | POA: Diagnosis not present

## 2021-10-30 DIAGNOSIS — R41841 Cognitive communication deficit: Secondary | ICD-10-CM | POA: Diagnosis not present

## 2021-10-30 DIAGNOSIS — R2681 Unsteadiness on feet: Secondary | ICD-10-CM | POA: Diagnosis not present

## 2021-10-30 DIAGNOSIS — R488 Other symbolic dysfunctions: Secondary | ICD-10-CM | POA: Diagnosis not present

## 2021-10-31 DIAGNOSIS — M150 Primary generalized (osteo)arthritis: Secondary | ICD-10-CM | POA: Diagnosis not present

## 2021-10-31 DIAGNOSIS — M6281 Muscle weakness (generalized): Secondary | ICD-10-CM | POA: Diagnosis not present

## 2021-10-31 DIAGNOSIS — R2689 Other abnormalities of gait and mobility: Secondary | ICD-10-CM | POA: Diagnosis not present

## 2021-10-31 DIAGNOSIS — R488 Other symbolic dysfunctions: Secondary | ICD-10-CM | POA: Diagnosis not present

## 2021-10-31 DIAGNOSIS — R41841 Cognitive communication deficit: Secondary | ICD-10-CM | POA: Diagnosis not present

## 2021-10-31 DIAGNOSIS — R2681 Unsteadiness on feet: Secondary | ICD-10-CM | POA: Diagnosis not present

## 2021-10-31 DIAGNOSIS — Z9181 History of falling: Secondary | ICD-10-CM | POA: Diagnosis not present

## 2021-11-01 DIAGNOSIS — Z9181 History of falling: Secondary | ICD-10-CM | POA: Diagnosis not present

## 2021-11-01 DIAGNOSIS — M150 Primary generalized (osteo)arthritis: Secondary | ICD-10-CM | POA: Diagnosis not present

## 2021-11-01 DIAGNOSIS — R41841 Cognitive communication deficit: Secondary | ICD-10-CM | POA: Diagnosis not present

## 2021-11-01 DIAGNOSIS — R2689 Other abnormalities of gait and mobility: Secondary | ICD-10-CM | POA: Diagnosis not present

## 2021-11-01 DIAGNOSIS — R488 Other symbolic dysfunctions: Secondary | ICD-10-CM | POA: Diagnosis not present

## 2021-11-01 DIAGNOSIS — R2681 Unsteadiness on feet: Secondary | ICD-10-CM | POA: Diagnosis not present

## 2021-11-01 DIAGNOSIS — M6281 Muscle weakness (generalized): Secondary | ICD-10-CM | POA: Diagnosis not present

## 2021-11-02 DIAGNOSIS — M6281 Muscle weakness (generalized): Secondary | ICD-10-CM | POA: Diagnosis not present

## 2021-11-02 DIAGNOSIS — R2689 Other abnormalities of gait and mobility: Secondary | ICD-10-CM | POA: Diagnosis not present

## 2021-11-02 DIAGNOSIS — M150 Primary generalized (osteo)arthritis: Secondary | ICD-10-CM | POA: Diagnosis not present

## 2021-11-02 DIAGNOSIS — R41841 Cognitive communication deficit: Secondary | ICD-10-CM | POA: Diagnosis not present

## 2021-11-02 DIAGNOSIS — R488 Other symbolic dysfunctions: Secondary | ICD-10-CM | POA: Diagnosis not present

## 2021-11-02 DIAGNOSIS — Z9181 History of falling: Secondary | ICD-10-CM | POA: Diagnosis not present

## 2021-11-02 DIAGNOSIS — R2681 Unsteadiness on feet: Secondary | ICD-10-CM | POA: Diagnosis not present

## 2021-11-03 DIAGNOSIS — M6281 Muscle weakness (generalized): Secondary | ICD-10-CM | POA: Diagnosis not present

## 2021-11-03 DIAGNOSIS — Z9181 History of falling: Secondary | ICD-10-CM | POA: Diagnosis not present

## 2021-11-03 DIAGNOSIS — R2681 Unsteadiness on feet: Secondary | ICD-10-CM | POA: Diagnosis not present

## 2021-11-03 DIAGNOSIS — R488 Other symbolic dysfunctions: Secondary | ICD-10-CM | POA: Diagnosis not present

## 2021-11-03 DIAGNOSIS — R2689 Other abnormalities of gait and mobility: Secondary | ICD-10-CM | POA: Diagnosis not present

## 2021-11-03 DIAGNOSIS — R41841 Cognitive communication deficit: Secondary | ICD-10-CM | POA: Diagnosis not present

## 2021-11-03 DIAGNOSIS — M150 Primary generalized (osteo)arthritis: Secondary | ICD-10-CM | POA: Diagnosis not present

## 2021-11-06 DIAGNOSIS — R2681 Unsteadiness on feet: Secondary | ICD-10-CM | POA: Diagnosis not present

## 2021-11-06 DIAGNOSIS — M6281 Muscle weakness (generalized): Secondary | ICD-10-CM | POA: Diagnosis not present

## 2021-11-06 DIAGNOSIS — R41841 Cognitive communication deficit: Secondary | ICD-10-CM | POA: Diagnosis not present

## 2021-11-06 DIAGNOSIS — R2689 Other abnormalities of gait and mobility: Secondary | ICD-10-CM | POA: Diagnosis not present

## 2021-11-06 DIAGNOSIS — Z9181 History of falling: Secondary | ICD-10-CM | POA: Diagnosis not present

## 2021-11-06 DIAGNOSIS — R488 Other symbolic dysfunctions: Secondary | ICD-10-CM | POA: Diagnosis not present

## 2021-11-06 DIAGNOSIS — M150 Primary generalized (osteo)arthritis: Secondary | ICD-10-CM | POA: Diagnosis not present

## 2021-11-07 DIAGNOSIS — R41841 Cognitive communication deficit: Secondary | ICD-10-CM | POA: Diagnosis not present

## 2021-11-07 DIAGNOSIS — R488 Other symbolic dysfunctions: Secondary | ICD-10-CM | POA: Diagnosis not present

## 2021-11-07 DIAGNOSIS — M6281 Muscle weakness (generalized): Secondary | ICD-10-CM | POA: Diagnosis not present

## 2021-11-07 DIAGNOSIS — Z9181 History of falling: Secondary | ICD-10-CM | POA: Diagnosis not present

## 2021-11-07 DIAGNOSIS — R2681 Unsteadiness on feet: Secondary | ICD-10-CM | POA: Diagnosis not present

## 2021-11-07 DIAGNOSIS — M150 Primary generalized (osteo)arthritis: Secondary | ICD-10-CM | POA: Diagnosis not present

## 2021-11-07 DIAGNOSIS — R2689 Other abnormalities of gait and mobility: Secondary | ICD-10-CM | POA: Diagnosis not present

## 2021-11-08 DIAGNOSIS — Z9181 History of falling: Secondary | ICD-10-CM | POA: Diagnosis not present

## 2021-11-08 DIAGNOSIS — R2689 Other abnormalities of gait and mobility: Secondary | ICD-10-CM | POA: Diagnosis not present

## 2021-11-08 DIAGNOSIS — M150 Primary generalized (osteo)arthritis: Secondary | ICD-10-CM | POA: Diagnosis not present

## 2021-11-08 DIAGNOSIS — R41841 Cognitive communication deficit: Secondary | ICD-10-CM | POA: Diagnosis not present

## 2021-11-08 DIAGNOSIS — R488 Other symbolic dysfunctions: Secondary | ICD-10-CM | POA: Diagnosis not present

## 2021-11-08 DIAGNOSIS — R2681 Unsteadiness on feet: Secondary | ICD-10-CM | POA: Diagnosis not present

## 2021-11-08 DIAGNOSIS — M6281 Muscle weakness (generalized): Secondary | ICD-10-CM | POA: Diagnosis not present

## 2021-11-09 DIAGNOSIS — R2681 Unsteadiness on feet: Secondary | ICD-10-CM | POA: Diagnosis not present

## 2021-11-09 DIAGNOSIS — M6281 Muscle weakness (generalized): Secondary | ICD-10-CM | POA: Diagnosis not present

## 2021-11-09 DIAGNOSIS — Z9181 History of falling: Secondary | ICD-10-CM | POA: Diagnosis not present

## 2021-11-09 DIAGNOSIS — R2689 Other abnormalities of gait and mobility: Secondary | ICD-10-CM | POA: Diagnosis not present

## 2021-11-09 DIAGNOSIS — M150 Primary generalized (osteo)arthritis: Secondary | ICD-10-CM | POA: Diagnosis not present

## 2021-11-09 DIAGNOSIS — R488 Other symbolic dysfunctions: Secondary | ICD-10-CM | POA: Diagnosis not present

## 2021-11-09 DIAGNOSIS — R41841 Cognitive communication deficit: Secondary | ICD-10-CM | POA: Diagnosis not present

## 2021-11-10 DIAGNOSIS — R2681 Unsteadiness on feet: Secondary | ICD-10-CM | POA: Diagnosis not present

## 2021-11-10 DIAGNOSIS — R488 Other symbolic dysfunctions: Secondary | ICD-10-CM | POA: Diagnosis not present

## 2021-11-10 DIAGNOSIS — R41841 Cognitive communication deficit: Secondary | ICD-10-CM | POA: Diagnosis not present

## 2021-11-10 DIAGNOSIS — R2689 Other abnormalities of gait and mobility: Secondary | ICD-10-CM | POA: Diagnosis not present

## 2021-11-10 DIAGNOSIS — Z9181 History of falling: Secondary | ICD-10-CM | POA: Diagnosis not present

## 2021-11-10 DIAGNOSIS — M150 Primary generalized (osteo)arthritis: Secondary | ICD-10-CM | POA: Diagnosis not present

## 2021-11-10 DIAGNOSIS — M6281 Muscle weakness (generalized): Secondary | ICD-10-CM | POA: Diagnosis not present

## 2021-11-13 DIAGNOSIS — R41841 Cognitive communication deficit: Secondary | ICD-10-CM | POA: Diagnosis not present

## 2021-11-13 DIAGNOSIS — M150 Primary generalized (osteo)arthritis: Secondary | ICD-10-CM | POA: Diagnosis not present

## 2021-11-13 DIAGNOSIS — R488 Other symbolic dysfunctions: Secondary | ICD-10-CM | POA: Diagnosis not present

## 2021-11-13 DIAGNOSIS — Z9181 History of falling: Secondary | ICD-10-CM | POA: Diagnosis not present

## 2021-11-13 DIAGNOSIS — M6281 Muscle weakness (generalized): Secondary | ICD-10-CM | POA: Diagnosis not present

## 2021-11-13 DIAGNOSIS — R2689 Other abnormalities of gait and mobility: Secondary | ICD-10-CM | POA: Diagnosis not present

## 2021-11-13 DIAGNOSIS — R2681 Unsteadiness on feet: Secondary | ICD-10-CM | POA: Diagnosis not present

## 2021-11-15 DIAGNOSIS — R488 Other symbolic dysfunctions: Secondary | ICD-10-CM | POA: Diagnosis not present

## 2021-11-15 DIAGNOSIS — R41841 Cognitive communication deficit: Secondary | ICD-10-CM | POA: Diagnosis not present

## 2021-11-15 DIAGNOSIS — R2681 Unsteadiness on feet: Secondary | ICD-10-CM | POA: Diagnosis not present

## 2021-11-15 DIAGNOSIS — R2689 Other abnormalities of gait and mobility: Secondary | ICD-10-CM | POA: Diagnosis not present

## 2021-11-15 DIAGNOSIS — Z9181 History of falling: Secondary | ICD-10-CM | POA: Diagnosis not present

## 2021-11-15 DIAGNOSIS — M6281 Muscle weakness (generalized): Secondary | ICD-10-CM | POA: Diagnosis not present

## 2021-11-15 DIAGNOSIS — M150 Primary generalized (osteo)arthritis: Secondary | ICD-10-CM | POA: Diagnosis not present

## 2021-11-16 DIAGNOSIS — R2689 Other abnormalities of gait and mobility: Secondary | ICD-10-CM | POA: Diagnosis not present

## 2021-11-16 DIAGNOSIS — R41841 Cognitive communication deficit: Secondary | ICD-10-CM | POA: Diagnosis not present

## 2021-11-16 DIAGNOSIS — R488 Other symbolic dysfunctions: Secondary | ICD-10-CM | POA: Diagnosis not present

## 2021-11-16 DIAGNOSIS — Z9181 History of falling: Secondary | ICD-10-CM | POA: Diagnosis not present

## 2021-11-16 DIAGNOSIS — R2681 Unsteadiness on feet: Secondary | ICD-10-CM | POA: Diagnosis not present

## 2021-11-16 DIAGNOSIS — M150 Primary generalized (osteo)arthritis: Secondary | ICD-10-CM | POA: Diagnosis not present

## 2021-11-16 DIAGNOSIS — M6281 Muscle weakness (generalized): Secondary | ICD-10-CM | POA: Diagnosis not present

## 2021-11-17 DIAGNOSIS — R488 Other symbolic dysfunctions: Secondary | ICD-10-CM | POA: Diagnosis not present

## 2021-11-17 DIAGNOSIS — Z9181 History of falling: Secondary | ICD-10-CM | POA: Diagnosis not present

## 2021-11-17 DIAGNOSIS — R2689 Other abnormalities of gait and mobility: Secondary | ICD-10-CM | POA: Diagnosis not present

## 2021-11-17 DIAGNOSIS — M6281 Muscle weakness (generalized): Secondary | ICD-10-CM | POA: Diagnosis not present

## 2021-11-17 DIAGNOSIS — R2681 Unsteadiness on feet: Secondary | ICD-10-CM | POA: Diagnosis not present

## 2021-11-17 DIAGNOSIS — M150 Primary generalized (osteo)arthritis: Secondary | ICD-10-CM | POA: Diagnosis not present

## 2021-11-17 DIAGNOSIS — R41841 Cognitive communication deficit: Secondary | ICD-10-CM | POA: Diagnosis not present

## 2021-11-18 DIAGNOSIS — R488 Other symbolic dysfunctions: Secondary | ICD-10-CM | POA: Diagnosis not present

## 2021-11-18 DIAGNOSIS — R41841 Cognitive communication deficit: Secondary | ICD-10-CM | POA: Diagnosis not present

## 2021-11-18 DIAGNOSIS — M150 Primary generalized (osteo)arthritis: Secondary | ICD-10-CM | POA: Diagnosis not present

## 2021-11-18 DIAGNOSIS — R2681 Unsteadiness on feet: Secondary | ICD-10-CM | POA: Diagnosis not present

## 2021-11-18 DIAGNOSIS — M6281 Muscle weakness (generalized): Secondary | ICD-10-CM | POA: Diagnosis not present

## 2021-11-18 DIAGNOSIS — R2689 Other abnormalities of gait and mobility: Secondary | ICD-10-CM | POA: Diagnosis not present

## 2021-11-18 DIAGNOSIS — Z9181 History of falling: Secondary | ICD-10-CM | POA: Diagnosis not present

## 2021-12-01 DIAGNOSIS — M6281 Muscle weakness (generalized): Secondary | ICD-10-CM | POA: Diagnosis not present

## 2021-12-01 DIAGNOSIS — Z9181 History of falling: Secondary | ICD-10-CM | POA: Diagnosis not present

## 2021-12-01 DIAGNOSIS — R488 Other symbolic dysfunctions: Secondary | ICD-10-CM | POA: Diagnosis not present

## 2021-12-01 DIAGNOSIS — R2681 Unsteadiness on feet: Secondary | ICD-10-CM | POA: Diagnosis not present

## 2021-12-01 DIAGNOSIS — R41841 Cognitive communication deficit: Secondary | ICD-10-CM | POA: Diagnosis not present

## 2021-12-01 DIAGNOSIS — R2689 Other abnormalities of gait and mobility: Secondary | ICD-10-CM | POA: Diagnosis not present

## 2021-12-01 DIAGNOSIS — M150 Primary generalized (osteo)arthritis: Secondary | ICD-10-CM | POA: Diagnosis not present

## 2021-12-02 DIAGNOSIS — M6281 Muscle weakness (generalized): Secondary | ICD-10-CM | POA: Diagnosis not present

## 2021-12-02 DIAGNOSIS — Z9181 History of falling: Secondary | ICD-10-CM | POA: Diagnosis not present

## 2021-12-02 DIAGNOSIS — R41841 Cognitive communication deficit: Secondary | ICD-10-CM | POA: Diagnosis not present

## 2021-12-02 DIAGNOSIS — M150 Primary generalized (osteo)arthritis: Secondary | ICD-10-CM | POA: Diagnosis not present

## 2021-12-02 DIAGNOSIS — R488 Other symbolic dysfunctions: Secondary | ICD-10-CM | POA: Diagnosis not present

## 2021-12-02 DIAGNOSIS — R2689 Other abnormalities of gait and mobility: Secondary | ICD-10-CM | POA: Diagnosis not present

## 2021-12-02 DIAGNOSIS — R2681 Unsteadiness on feet: Secondary | ICD-10-CM | POA: Diagnosis not present

## 2021-12-04 DIAGNOSIS — M6281 Muscle weakness (generalized): Secondary | ICD-10-CM | POA: Diagnosis not present

## 2021-12-04 DIAGNOSIS — M150 Primary generalized (osteo)arthritis: Secondary | ICD-10-CM | POA: Diagnosis not present

## 2021-12-04 DIAGNOSIS — Z9181 History of falling: Secondary | ICD-10-CM | POA: Diagnosis not present

## 2021-12-04 DIAGNOSIS — R2681 Unsteadiness on feet: Secondary | ICD-10-CM | POA: Diagnosis not present

## 2021-12-04 DIAGNOSIS — R488 Other symbolic dysfunctions: Secondary | ICD-10-CM | POA: Diagnosis not present

## 2021-12-04 DIAGNOSIS — R41841 Cognitive communication deficit: Secondary | ICD-10-CM | POA: Diagnosis not present

## 2021-12-04 DIAGNOSIS — R2689 Other abnormalities of gait and mobility: Secondary | ICD-10-CM | POA: Diagnosis not present

## 2021-12-05 DIAGNOSIS — R2681 Unsteadiness on feet: Secondary | ICD-10-CM | POA: Diagnosis not present

## 2021-12-05 DIAGNOSIS — M150 Primary generalized (osteo)arthritis: Secondary | ICD-10-CM | POA: Diagnosis not present

## 2021-12-05 DIAGNOSIS — R41841 Cognitive communication deficit: Secondary | ICD-10-CM | POA: Diagnosis not present

## 2021-12-05 DIAGNOSIS — R2689 Other abnormalities of gait and mobility: Secondary | ICD-10-CM | POA: Diagnosis not present

## 2021-12-05 DIAGNOSIS — R488 Other symbolic dysfunctions: Secondary | ICD-10-CM | POA: Diagnosis not present

## 2021-12-05 DIAGNOSIS — Z9181 History of falling: Secondary | ICD-10-CM | POA: Diagnosis not present

## 2021-12-05 DIAGNOSIS — M6281 Muscle weakness (generalized): Secondary | ICD-10-CM | POA: Diagnosis not present

## 2021-12-06 DIAGNOSIS — R488 Other symbolic dysfunctions: Secondary | ICD-10-CM | POA: Diagnosis not present

## 2021-12-06 DIAGNOSIS — M6281 Muscle weakness (generalized): Secondary | ICD-10-CM | POA: Diagnosis not present

## 2021-12-06 DIAGNOSIS — Z9181 History of falling: Secondary | ICD-10-CM | POA: Diagnosis not present

## 2021-12-06 DIAGNOSIS — R41841 Cognitive communication deficit: Secondary | ICD-10-CM | POA: Diagnosis not present

## 2021-12-08 DIAGNOSIS — M6281 Muscle weakness (generalized): Secondary | ICD-10-CM | POA: Diagnosis not present

## 2021-12-08 DIAGNOSIS — R41841 Cognitive communication deficit: Secondary | ICD-10-CM | POA: Diagnosis not present

## 2021-12-08 DIAGNOSIS — Z9181 History of falling: Secondary | ICD-10-CM | POA: Diagnosis not present

## 2021-12-08 DIAGNOSIS — R488 Other symbolic dysfunctions: Secondary | ICD-10-CM | POA: Diagnosis not present

## 2021-12-09 DIAGNOSIS — M6281 Muscle weakness (generalized): Secondary | ICD-10-CM | POA: Diagnosis not present

## 2021-12-09 DIAGNOSIS — Z9181 History of falling: Secondary | ICD-10-CM | POA: Diagnosis not present

## 2021-12-09 DIAGNOSIS — R41841 Cognitive communication deficit: Secondary | ICD-10-CM | POA: Diagnosis not present

## 2021-12-09 DIAGNOSIS — R488 Other symbolic dysfunctions: Secondary | ICD-10-CM | POA: Diagnosis not present

## 2021-12-11 DIAGNOSIS — M6281 Muscle weakness (generalized): Secondary | ICD-10-CM | POA: Diagnosis not present

## 2021-12-11 DIAGNOSIS — R488 Other symbolic dysfunctions: Secondary | ICD-10-CM | POA: Diagnosis not present

## 2021-12-11 DIAGNOSIS — R41841 Cognitive communication deficit: Secondary | ICD-10-CM | POA: Diagnosis not present

## 2021-12-11 DIAGNOSIS — Z9181 History of falling: Secondary | ICD-10-CM | POA: Diagnosis not present

## 2021-12-12 DIAGNOSIS — R488 Other symbolic dysfunctions: Secondary | ICD-10-CM | POA: Diagnosis not present

## 2021-12-12 DIAGNOSIS — Z9181 History of falling: Secondary | ICD-10-CM | POA: Diagnosis not present

## 2021-12-12 DIAGNOSIS — M6281 Muscle weakness (generalized): Secondary | ICD-10-CM | POA: Diagnosis not present

## 2021-12-12 DIAGNOSIS — R41841 Cognitive communication deficit: Secondary | ICD-10-CM | POA: Diagnosis not present

## 2021-12-13 DIAGNOSIS — Z9181 History of falling: Secondary | ICD-10-CM | POA: Diagnosis not present

## 2021-12-13 DIAGNOSIS — R41841 Cognitive communication deficit: Secondary | ICD-10-CM | POA: Diagnosis not present

## 2021-12-13 DIAGNOSIS — M6281 Muscle weakness (generalized): Secondary | ICD-10-CM | POA: Diagnosis not present

## 2021-12-13 DIAGNOSIS — R488 Other symbolic dysfunctions: Secondary | ICD-10-CM | POA: Diagnosis not present

## 2021-12-14 DIAGNOSIS — M6281 Muscle weakness (generalized): Secondary | ICD-10-CM | POA: Diagnosis not present

## 2021-12-14 DIAGNOSIS — R41841 Cognitive communication deficit: Secondary | ICD-10-CM | POA: Diagnosis not present

## 2021-12-14 DIAGNOSIS — R488 Other symbolic dysfunctions: Secondary | ICD-10-CM | POA: Diagnosis not present

## 2021-12-14 DIAGNOSIS — Z9181 History of falling: Secondary | ICD-10-CM | POA: Diagnosis not present

## 2021-12-19 DIAGNOSIS — R41841 Cognitive communication deficit: Secondary | ICD-10-CM | POA: Diagnosis not present

## 2021-12-19 DIAGNOSIS — R488 Other symbolic dysfunctions: Secondary | ICD-10-CM | POA: Diagnosis not present

## 2021-12-19 DIAGNOSIS — Z9181 History of falling: Secondary | ICD-10-CM | POA: Diagnosis not present

## 2021-12-19 DIAGNOSIS — M6281 Muscle weakness (generalized): Secondary | ICD-10-CM | POA: Diagnosis not present

## 2021-12-20 DIAGNOSIS — Z9181 History of falling: Secondary | ICD-10-CM | POA: Diagnosis not present

## 2021-12-20 DIAGNOSIS — R41841 Cognitive communication deficit: Secondary | ICD-10-CM | POA: Diagnosis not present

## 2021-12-20 DIAGNOSIS — M6281 Muscle weakness (generalized): Secondary | ICD-10-CM | POA: Diagnosis not present

## 2021-12-20 DIAGNOSIS — R488 Other symbolic dysfunctions: Secondary | ICD-10-CM | POA: Diagnosis not present

## 2021-12-22 DIAGNOSIS — Z9181 History of falling: Secondary | ICD-10-CM | POA: Diagnosis not present

## 2021-12-22 DIAGNOSIS — M6281 Muscle weakness (generalized): Secondary | ICD-10-CM | POA: Diagnosis not present

## 2021-12-22 DIAGNOSIS — R488 Other symbolic dysfunctions: Secondary | ICD-10-CM | POA: Diagnosis not present

## 2021-12-22 DIAGNOSIS — R41841 Cognitive communication deficit: Secondary | ICD-10-CM | POA: Diagnosis not present

## 2021-12-25 DIAGNOSIS — R488 Other symbolic dysfunctions: Secondary | ICD-10-CM | POA: Diagnosis not present

## 2021-12-25 DIAGNOSIS — R41841 Cognitive communication deficit: Secondary | ICD-10-CM | POA: Diagnosis not present

## 2021-12-25 DIAGNOSIS — M6281 Muscle weakness (generalized): Secondary | ICD-10-CM | POA: Diagnosis not present

## 2021-12-25 DIAGNOSIS — Z9181 History of falling: Secondary | ICD-10-CM | POA: Diagnosis not present

## 2021-12-26 DIAGNOSIS — R41841 Cognitive communication deficit: Secondary | ICD-10-CM | POA: Diagnosis not present

## 2021-12-26 DIAGNOSIS — R488 Other symbolic dysfunctions: Secondary | ICD-10-CM | POA: Diagnosis not present

## 2021-12-26 DIAGNOSIS — Z9181 History of falling: Secondary | ICD-10-CM | POA: Diagnosis not present

## 2021-12-26 DIAGNOSIS — M6281 Muscle weakness (generalized): Secondary | ICD-10-CM | POA: Diagnosis not present

## 2021-12-27 DIAGNOSIS — M6281 Muscle weakness (generalized): Secondary | ICD-10-CM | POA: Diagnosis not present

## 2021-12-27 DIAGNOSIS — R488 Other symbolic dysfunctions: Secondary | ICD-10-CM | POA: Diagnosis not present

## 2021-12-27 DIAGNOSIS — R41841 Cognitive communication deficit: Secondary | ICD-10-CM | POA: Diagnosis not present

## 2021-12-27 DIAGNOSIS — Z9181 History of falling: Secondary | ICD-10-CM | POA: Diagnosis not present

## 2021-12-28 DIAGNOSIS — R488 Other symbolic dysfunctions: Secondary | ICD-10-CM | POA: Diagnosis not present

## 2021-12-28 DIAGNOSIS — Z9181 History of falling: Secondary | ICD-10-CM | POA: Diagnosis not present

## 2021-12-28 DIAGNOSIS — R41841 Cognitive communication deficit: Secondary | ICD-10-CM | POA: Diagnosis not present

## 2021-12-28 DIAGNOSIS — M6281 Muscle weakness (generalized): Secondary | ICD-10-CM | POA: Diagnosis not present

## 2022-01-01 DIAGNOSIS — Z9181 History of falling: Secondary | ICD-10-CM | POA: Diagnosis not present

## 2022-01-01 DIAGNOSIS — M6281 Muscle weakness (generalized): Secondary | ICD-10-CM | POA: Diagnosis not present

## 2022-01-01 DIAGNOSIS — R41841 Cognitive communication deficit: Secondary | ICD-10-CM | POA: Diagnosis not present

## 2022-01-01 DIAGNOSIS — R488 Other symbolic dysfunctions: Secondary | ICD-10-CM | POA: Diagnosis not present

## 2022-01-02 DIAGNOSIS — R41841 Cognitive communication deficit: Secondary | ICD-10-CM | POA: Diagnosis not present

## 2022-01-02 DIAGNOSIS — Z9181 History of falling: Secondary | ICD-10-CM | POA: Diagnosis not present

## 2022-01-02 DIAGNOSIS — M6281 Muscle weakness (generalized): Secondary | ICD-10-CM | POA: Diagnosis not present

## 2022-01-02 DIAGNOSIS — R488 Other symbolic dysfunctions: Secondary | ICD-10-CM | POA: Diagnosis not present

## 2022-01-03 DIAGNOSIS — M6281 Muscle weakness (generalized): Secondary | ICD-10-CM | POA: Diagnosis not present

## 2022-01-03 DIAGNOSIS — Z9181 History of falling: Secondary | ICD-10-CM | POA: Diagnosis not present

## 2022-01-03 DIAGNOSIS — R41841 Cognitive communication deficit: Secondary | ICD-10-CM | POA: Diagnosis not present

## 2022-01-03 DIAGNOSIS — R488 Other symbolic dysfunctions: Secondary | ICD-10-CM | POA: Diagnosis not present

## 2022-01-04 DIAGNOSIS — M6281 Muscle weakness (generalized): Secondary | ICD-10-CM | POA: Diagnosis not present

## 2022-01-04 DIAGNOSIS — R41841 Cognitive communication deficit: Secondary | ICD-10-CM | POA: Diagnosis not present

## 2022-01-04 DIAGNOSIS — R488 Other symbolic dysfunctions: Secondary | ICD-10-CM | POA: Diagnosis not present

## 2022-01-04 DIAGNOSIS — Z9181 History of falling: Secondary | ICD-10-CM | POA: Diagnosis not present

## 2022-01-05 DIAGNOSIS — R41841 Cognitive communication deficit: Secondary | ICD-10-CM | POA: Diagnosis not present

## 2022-01-05 DIAGNOSIS — M6281 Muscle weakness (generalized): Secondary | ICD-10-CM | POA: Diagnosis not present

## 2022-01-05 DIAGNOSIS — Z9181 History of falling: Secondary | ICD-10-CM | POA: Diagnosis not present

## 2022-01-05 DIAGNOSIS — R488 Other symbolic dysfunctions: Secondary | ICD-10-CM | POA: Diagnosis not present

## 2022-01-08 DIAGNOSIS — Z9181 History of falling: Secondary | ICD-10-CM | POA: Diagnosis not present

## 2022-01-08 DIAGNOSIS — M6281 Muscle weakness (generalized): Secondary | ICD-10-CM | POA: Diagnosis not present

## 2022-01-09 DIAGNOSIS — R41841 Cognitive communication deficit: Secondary | ICD-10-CM | POA: Diagnosis not present

## 2022-01-09 DIAGNOSIS — R488 Other symbolic dysfunctions: Secondary | ICD-10-CM | POA: Diagnosis not present

## 2022-01-10 DIAGNOSIS — Z9181 History of falling: Secondary | ICD-10-CM | POA: Diagnosis not present

## 2022-01-10 DIAGNOSIS — M6281 Muscle weakness (generalized): Secondary | ICD-10-CM | POA: Diagnosis not present

## 2022-01-12 DIAGNOSIS — M6281 Muscle weakness (generalized): Secondary | ICD-10-CM | POA: Diagnosis not present

## 2022-01-12 DIAGNOSIS — Z9181 History of falling: Secondary | ICD-10-CM | POA: Diagnosis not present

## 2022-01-15 DIAGNOSIS — M6281 Muscle weakness (generalized): Secondary | ICD-10-CM | POA: Diagnosis not present

## 2022-01-15 DIAGNOSIS — G3184 Mild cognitive impairment, so stated: Secondary | ICD-10-CM | POA: Diagnosis not present

## 2022-01-15 DIAGNOSIS — F411 Generalized anxiety disorder: Secondary | ICD-10-CM | POA: Diagnosis not present

## 2022-01-15 DIAGNOSIS — I4819 Other persistent atrial fibrillation: Secondary | ICD-10-CM | POA: Diagnosis not present

## 2022-01-15 DIAGNOSIS — Z9181 History of falling: Secondary | ICD-10-CM | POA: Diagnosis not present

## 2022-01-15 DIAGNOSIS — R0789 Other chest pain: Secondary | ICD-10-CM | POA: Diagnosis not present

## 2022-01-16 DIAGNOSIS — R41841 Cognitive communication deficit: Secondary | ICD-10-CM | POA: Diagnosis not present

## 2022-01-16 DIAGNOSIS — R488 Other symbolic dysfunctions: Secondary | ICD-10-CM | POA: Diagnosis not present

## 2022-01-18 DIAGNOSIS — R488 Other symbolic dysfunctions: Secondary | ICD-10-CM | POA: Diagnosis not present

## 2022-01-18 DIAGNOSIS — R41841 Cognitive communication deficit: Secondary | ICD-10-CM | POA: Diagnosis not present

## 2022-01-19 ENCOUNTER — Emergency Department: Payer: Medicare HMO

## 2022-01-19 ENCOUNTER — Inpatient Hospital Stay
Admission: EM | Admit: 2022-01-19 | Discharge: 2022-01-26 | DRG: 605 | Disposition: A | Discharge status: Skilled Nursing Facility | Payer: Medicare HMO | Attending: Internal Medicine | Admitting: Internal Medicine

## 2022-01-19 ENCOUNTER — Other Ambulatory Visit: Payer: Self-pay

## 2022-01-19 DIAGNOSIS — H9190 Unspecified hearing loss, unspecified ear: Secondary | ICD-10-CM | POA: Diagnosis present

## 2022-01-19 DIAGNOSIS — I951 Orthostatic hypotension: Secondary | ICD-10-CM | POA: Diagnosis not present

## 2022-01-19 DIAGNOSIS — M47812 Spondylosis without myelopathy or radiculopathy, cervical region: Secondary | ICD-10-CM | POA: Diagnosis not present

## 2022-01-19 DIAGNOSIS — Z7901 Long term (current) use of anticoagulants: Secondary | ICD-10-CM

## 2022-01-19 DIAGNOSIS — R0902 Hypoxemia: Secondary | ICD-10-CM | POA: Diagnosis not present

## 2022-01-19 DIAGNOSIS — I771 Stricture of artery: Secondary | ICD-10-CM | POA: Diagnosis not present

## 2022-01-19 DIAGNOSIS — G319 Degenerative disease of nervous system, unspecified: Secondary | ICD-10-CM | POA: Diagnosis not present

## 2022-01-19 DIAGNOSIS — I351 Nonrheumatic aortic (valve) insufficiency: Secondary | ICD-10-CM | POA: Diagnosis present

## 2022-01-19 DIAGNOSIS — F0393 Unspecified dementia, unspecified severity, with mood disturbance: Secondary | ICD-10-CM | POA: Diagnosis present

## 2022-01-19 DIAGNOSIS — T148XXA Other injury of unspecified body region, initial encounter: Secondary | ICD-10-CM

## 2022-01-19 DIAGNOSIS — Z743 Need for continuous supervision: Secondary | ICD-10-CM | POA: Diagnosis not present

## 2022-01-19 DIAGNOSIS — D649 Anemia, unspecified: Secondary | ICD-10-CM

## 2022-01-19 DIAGNOSIS — Z87891 Personal history of nicotine dependence: Secondary | ICD-10-CM | POA: Diagnosis not present

## 2022-01-19 DIAGNOSIS — Z66 Do not resuscitate: Secondary | ICD-10-CM | POA: Diagnosis not present

## 2022-01-19 DIAGNOSIS — S7011XA Contusion of right thigh, initial encounter: Secondary | ICD-10-CM | POA: Diagnosis not present

## 2022-01-19 DIAGNOSIS — Z8616 Personal history of COVID-19: Secondary | ICD-10-CM | POA: Diagnosis not present

## 2022-01-19 DIAGNOSIS — F0394 Unspecified dementia, unspecified severity, with anxiety: Secondary | ICD-10-CM | POA: Diagnosis present

## 2022-01-19 DIAGNOSIS — R600 Localized edema: Secondary | ICD-10-CM | POA: Diagnosis present

## 2022-01-19 DIAGNOSIS — F03A4 Unspecified dementia, mild, with anxiety: Secondary | ICD-10-CM | POA: Diagnosis not present

## 2022-01-19 DIAGNOSIS — R58 Hemorrhage, not elsewhere classified: Secondary | ICD-10-CM | POA: Diagnosis not present

## 2022-01-19 DIAGNOSIS — W1830XA Fall on same level, unspecified, initial encounter: Secondary | ICD-10-CM | POA: Diagnosis present

## 2022-01-19 DIAGNOSIS — K5790 Diverticulosis of intestine, part unspecified, without perforation or abscess without bleeding: Secondary | ICD-10-CM | POA: Insufficient documentation

## 2022-01-19 DIAGNOSIS — Z803 Family history of malignant neoplasm of breast: Secondary | ICD-10-CM | POA: Diagnosis not present

## 2022-01-19 DIAGNOSIS — D62 Acute posthemorrhagic anemia: Secondary | ICD-10-CM | POA: Diagnosis not present

## 2022-01-19 DIAGNOSIS — W19XXXA Unspecified fall, initial encounter: Secondary | ICD-10-CM

## 2022-01-19 DIAGNOSIS — I4892 Unspecified atrial flutter: Secondary | ICD-10-CM | POA: Diagnosis not present

## 2022-01-19 DIAGNOSIS — Z7983 Long term (current) use of bisphosphonates: Secondary | ICD-10-CM | POA: Diagnosis not present

## 2022-01-19 DIAGNOSIS — Z79899 Other long term (current) drug therapy: Secondary | ICD-10-CM

## 2022-01-19 DIAGNOSIS — I10 Essential (primary) hypertension: Secondary | ICD-10-CM | POA: Diagnosis not present

## 2022-01-19 DIAGNOSIS — E785 Hyperlipidemia, unspecified: Secondary | ICD-10-CM

## 2022-01-19 DIAGNOSIS — S79911A Unspecified injury of right hip, initial encounter: Secondary | ICD-10-CM | POA: Diagnosis present

## 2022-01-19 DIAGNOSIS — M7989 Other specified soft tissue disorders: Secondary | ICD-10-CM | POA: Diagnosis not present

## 2022-01-19 DIAGNOSIS — Z043 Encounter for examination and observation following other accident: Secondary | ICD-10-CM | POA: Diagnosis not present

## 2022-01-19 DIAGNOSIS — K219 Gastro-esophageal reflux disease without esophagitis: Secondary | ICD-10-CM | POA: Diagnosis present

## 2022-01-19 DIAGNOSIS — I6523 Occlusion and stenosis of bilateral carotid arteries: Secondary | ICD-10-CM | POA: Diagnosis not present

## 2022-01-19 DIAGNOSIS — E7800 Pure hypercholesterolemia, unspecified: Secondary | ICD-10-CM | POA: Diagnosis present

## 2022-01-19 DIAGNOSIS — E1169 Type 2 diabetes mellitus with other specified complication: Secondary | ICD-10-CM | POA: Diagnosis not present

## 2022-01-19 DIAGNOSIS — J439 Emphysema, unspecified: Secondary | ICD-10-CM | POA: Diagnosis present

## 2022-01-19 DIAGNOSIS — I4819 Other persistent atrial fibrillation: Secondary | ICD-10-CM | POA: Diagnosis present

## 2022-01-19 DIAGNOSIS — S7001XA Contusion of right hip, initial encounter: Principal | ICD-10-CM | POA: Diagnosis present

## 2022-01-19 DIAGNOSIS — E854 Organ-limited amyloidosis: Secondary | ICD-10-CM | POA: Diagnosis present

## 2022-01-19 DIAGNOSIS — Z789 Other specified health status: Secondary | ICD-10-CM

## 2022-01-19 DIAGNOSIS — E876 Hypokalemia: Secondary | ICD-10-CM | POA: Diagnosis not present

## 2022-01-19 DIAGNOSIS — S0990XA Unspecified injury of head, initial encounter: Secondary | ICD-10-CM | POA: Diagnosis not present

## 2022-01-19 DIAGNOSIS — M810 Age-related osteoporosis without current pathological fracture: Secondary | ICD-10-CM | POA: Diagnosis present

## 2022-01-19 DIAGNOSIS — Z862 Personal history of diseases of the blood and blood-forming organs and certain disorders involving the immune mechanism: Secondary | ICD-10-CM | POA: Diagnosis not present

## 2022-01-19 DIAGNOSIS — F0390 Unspecified dementia without behavioral disturbance: Secondary | ICD-10-CM

## 2022-01-19 DIAGNOSIS — F411 Generalized anxiety disorder: Secondary | ICD-10-CM | POA: Diagnosis present

## 2022-01-19 DIAGNOSIS — I680 Cerebral amyloid angiopathy: Secondary | ICD-10-CM | POA: Diagnosis present

## 2022-01-19 DIAGNOSIS — I959 Hypotension, unspecified: Secondary | ICD-10-CM | POA: Diagnosis not present

## 2022-01-19 DIAGNOSIS — M7981 Nontraumatic hematoma of soft tissue: Secondary | ICD-10-CM | POA: Diagnosis not present

## 2022-01-19 DIAGNOSIS — M25559 Pain in unspecified hip: Secondary | ICD-10-CM | POA: Diagnosis not present

## 2022-01-19 DIAGNOSIS — Z8673 Personal history of transient ischemic attack (TIA), and cerebral infarction without residual deficits: Secondary | ICD-10-CM

## 2022-01-19 DIAGNOSIS — R4182 Altered mental status, unspecified: Secondary | ICD-10-CM | POA: Diagnosis not present

## 2022-01-19 DIAGNOSIS — Y92019 Unspecified place in single-family (private) house as the place of occurrence of the external cause: Secondary | ICD-10-CM

## 2022-01-19 DIAGNOSIS — R55 Syncope and collapse: Secondary | ICD-10-CM | POA: Diagnosis not present

## 2022-01-19 DIAGNOSIS — R296 Repeated falls: Secondary | ICD-10-CM | POA: Diagnosis not present

## 2022-01-19 DIAGNOSIS — I080 Rheumatic disorders of both mitral and aortic valves: Secondary | ICD-10-CM | POA: Diagnosis present

## 2022-01-19 DIAGNOSIS — E119 Type 2 diabetes mellitus without complications: Secondary | ICD-10-CM | POA: Diagnosis not present

## 2022-01-19 DIAGNOSIS — W19XXXD Unspecified fall, subsequent encounter: Secondary | ICD-10-CM | POA: Diagnosis not present

## 2022-01-19 DIAGNOSIS — I340 Nonrheumatic mitral (valve) insufficiency: Secondary | ICD-10-CM | POA: Diagnosis present

## 2022-01-19 DIAGNOSIS — S8011XA Contusion of right lower leg, initial encounter: Secondary | ICD-10-CM | POA: Diagnosis not present

## 2022-01-19 LAB — CBC WITH DIFFERENTIAL/PLATELET
Abs Immature Granulocytes: 0.04 10*3/uL (ref 0.00–0.07)
Basophils Absolute: 0 10*3/uL (ref 0.0–0.1)
Basophils Relative: 0 %
Eosinophils Absolute: 0 10*3/uL (ref 0.0–0.5)
Eosinophils Relative: 0 %
HCT: 30 % — ABNORMAL LOW (ref 36.0–46.0)
Hemoglobin: 9.6 g/dL — ABNORMAL LOW (ref 12.0–15.0)
Immature Granulocytes: 0 %
Lymphocytes Relative: 7 %
Lymphs Abs: 0.6 10*3/uL — ABNORMAL LOW (ref 0.7–4.0)
MCH: 29.6 pg (ref 26.0–34.0)
MCHC: 32 g/dL (ref 30.0–36.0)
MCV: 92.6 fL (ref 80.0–100.0)
Monocytes Absolute: 0.5 10*3/uL (ref 0.1–1.0)
Monocytes Relative: 6 %
Neutro Abs: 7.8 10*3/uL — ABNORMAL HIGH (ref 1.7–7.7)
Neutrophils Relative %: 87 %
Platelets: 165 10*3/uL (ref 150–400)
RBC: 3.24 MIL/uL — ABNORMAL LOW (ref 3.87–5.11)
RDW: 13.2 % (ref 11.5–15.5)
WBC: 8.9 10*3/uL (ref 4.0–10.5)
nRBC: 0 % (ref 0.0–0.2)

## 2022-01-19 LAB — CBC
HCT: 28.4 % — ABNORMAL LOW (ref 36.0–46.0)
Hemoglobin: 9.1 g/dL — ABNORMAL LOW (ref 12.0–15.0)
MCH: 29.5 pg (ref 26.0–34.0)
MCHC: 32 g/dL (ref 30.0–36.0)
MCV: 92.2 fL (ref 80.0–100.0)
Platelets: 161 10*3/uL (ref 150–400)
RBC: 3.08 MIL/uL — ABNORMAL LOW (ref 3.87–5.11)
RDW: 13.2 % (ref 11.5–15.5)
WBC: 9 10*3/uL (ref 4.0–10.5)
nRBC: 0 % (ref 0.0–0.2)

## 2022-01-19 LAB — COMPREHENSIVE METABOLIC PANEL
ALT: 17 U/L (ref 0–44)
AST: 26 U/L (ref 15–41)
Albumin: 3.5 g/dL (ref 3.5–5.0)
Alkaline Phosphatase: 42 U/L (ref 38–126)
Anion gap: 9 (ref 5–15)
BUN: 31 mg/dL — ABNORMAL HIGH (ref 8–23)
CO2: 27 mmol/L (ref 22–32)
Calcium: 8.7 mg/dL — ABNORMAL LOW (ref 8.9–10.3)
Chloride: 104 mmol/L (ref 98–111)
Creatinine, Ser: 0.86 mg/dL (ref 0.44–1.00)
GFR, Estimated: >60 mL/min (ref >60)
Glucose, Bld: 204 mg/dL — ABNORMAL HIGH (ref 70–99)
Potassium: 3.1 mmol/L — ABNORMAL LOW (ref 3.5–5.1)
Sodium: 140 mmol/L (ref 135–145)
Total Bilirubin: 1.1 mg/dL (ref 0.3–1.2)
Total Protein: 6.3 g/dL — ABNORMAL LOW (ref 6.5–8.1)

## 2022-01-19 LAB — PROTIME-INR
INR: 1.2 (ref 0.8–1.2)
Prothrombin Time: 15.2 seconds (ref 11.4–15.2)

## 2022-01-19 LAB — IRON AND TIBC
Iron: 32 ug/dL (ref 28–170)
Saturation Ratios: 10 % — ABNORMAL LOW (ref 10.4–31.8)
TIBC: 332 ug/dL (ref 250–450)
UIBC: 300 ug/dL

## 2022-01-19 LAB — HEMOGLOBIN AND HEMATOCRIT, BLOOD
HCT: 28.7 % — ABNORMAL LOW (ref 36.0–46.0)
Hemoglobin: 9.3 g/dL — ABNORMAL LOW (ref 12.0–15.0)

## 2022-01-19 LAB — GLUCOSE, CAPILLARY
Glucose-Capillary: 108 mg/dL — ABNORMAL HIGH (ref 70–99)
Glucose-Capillary: 111 mg/dL — ABNORMAL HIGH (ref 70–99)

## 2022-01-19 LAB — CK: Total CK: 205 U/L (ref 38–234)

## 2022-01-19 LAB — HIV ANTIBODY (ROUTINE TESTING W REFLEX): HIV Screen 4th Generation wRfx: NONREACTIVE

## 2022-01-19 LAB — FERRITIN: Ferritin: 125 ng/mL (ref 11–307)

## 2022-01-19 LAB — TYPE AND SCREEN
ABO/RH(D): B POS
Antibody Screen: NEGATIVE

## 2022-01-19 LAB — TSH: TSH: 0.604 u[IU]/mL (ref 0.350–4.500)

## 2022-01-19 LAB — VITAMIN B12: Vitamin B-12: 556 pg/mL (ref 180–914)

## 2022-01-19 LAB — APTT: aPTT: 30 seconds (ref 24–36)

## 2022-01-19 IMAGING — MR MR HIP*R* W/O CM
5 series · 39 of 40 positions shown · non-contrast
Comparison: None.

CLINICAL DATA: Status post fall.  Right hip pain.

EXAM:
MR OF THE RIGHT HIP WITHOUT CONTRAST
TECHNIQUE: Multiplanar, multisequence MR imaging was performed. No intravenous
contrast was administered.

[Series 8: STIR · coronal · right · 4.0mm · 1.25mm/px · 8 of 40 slices shown]
[im 1/40]
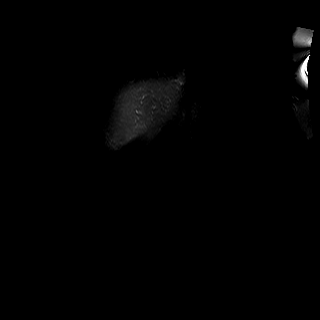
[im 5/40]
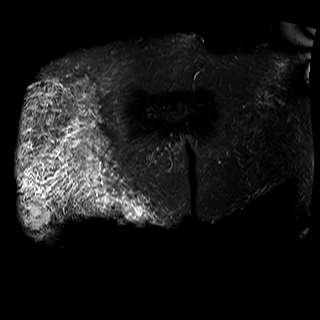
[im 10/40]
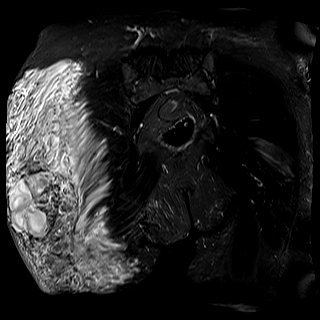
[im 15/40]
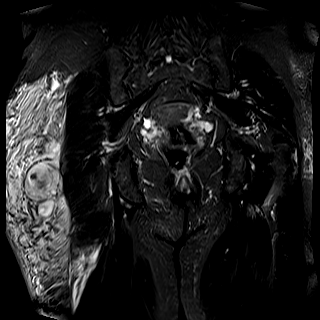
[im 25/40]
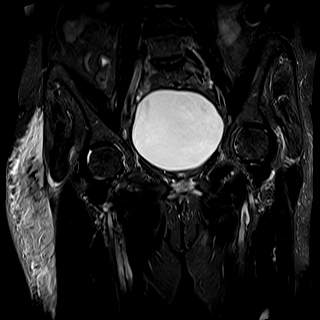
[im 30/40]
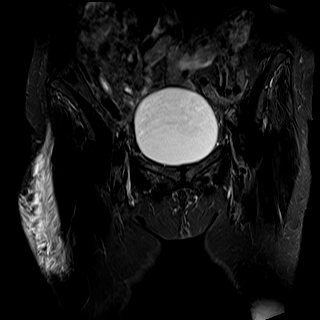
[im 35/40]
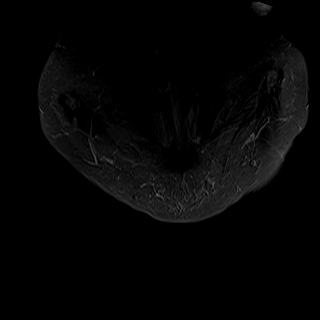
[im 40/40]
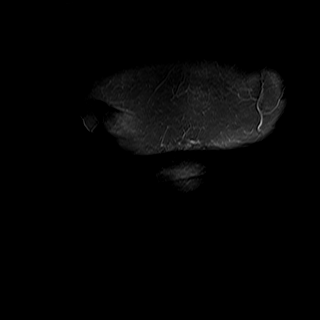

[Series 9: T1 · coronal · right · 4.0mm · 1.25mm/px · 10 of 40 slices shown]
[im 1/40]
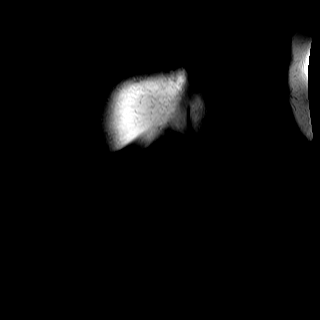
[im 5/40]
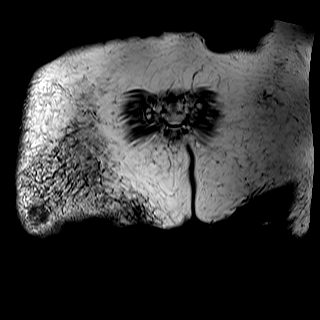
[im 9/40]
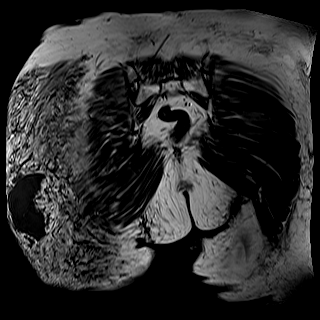
[im 14/40]
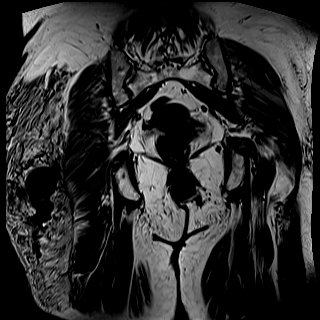
[im 18/40]
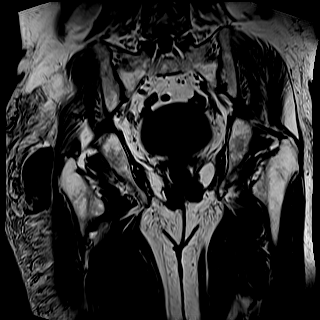
[im 22/40]
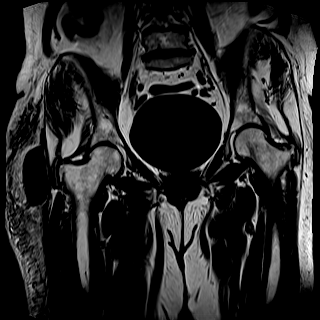
[im 27/40]
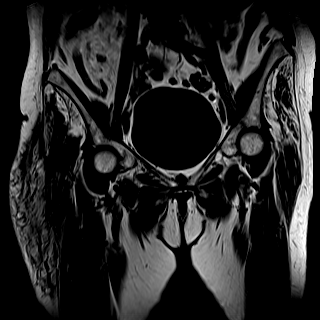
[im 31/40]
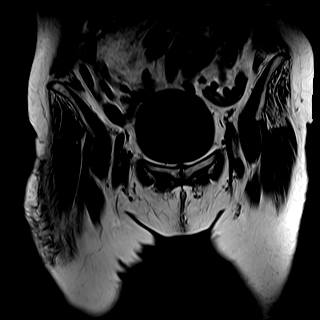
[im 35/40]
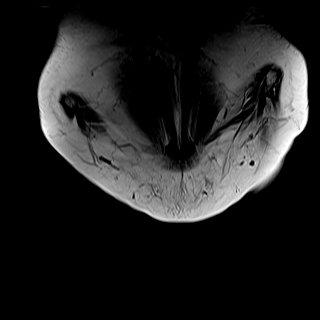
[im 40/40]
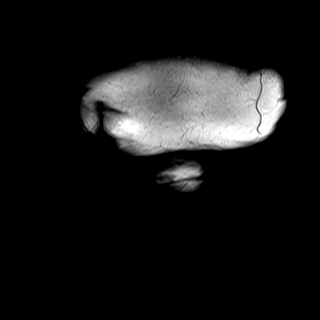

[Series 10: T2 fat-sat · axial · right · 5.0mm · 0.78mm/px · z∈[-107,+67]mm · 7 of 30 slices shown]
[im 1/30]
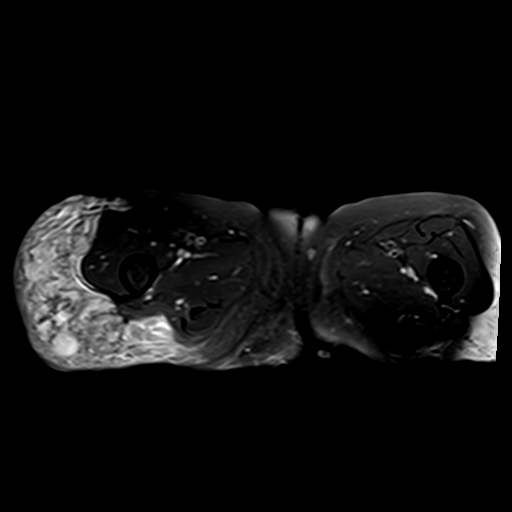
[im 5/30]
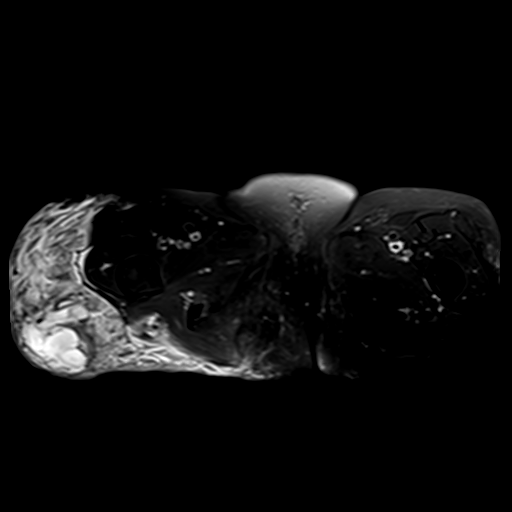
[im 10/30]
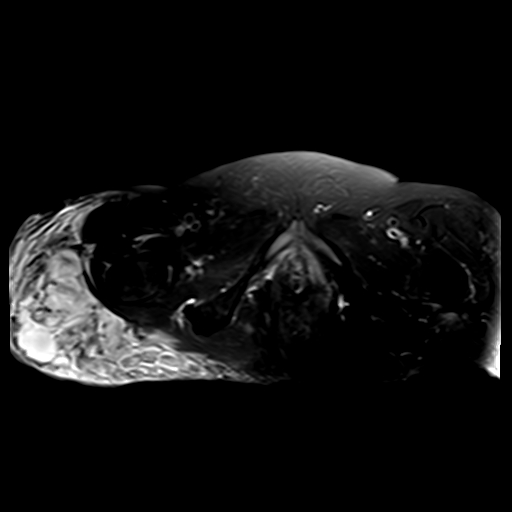
[im 15/30]
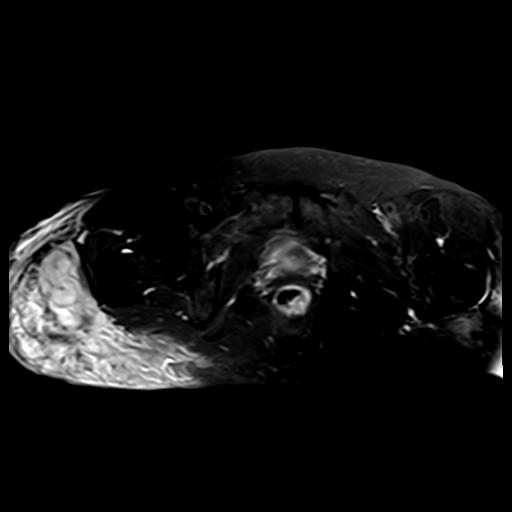
[im 20/30]
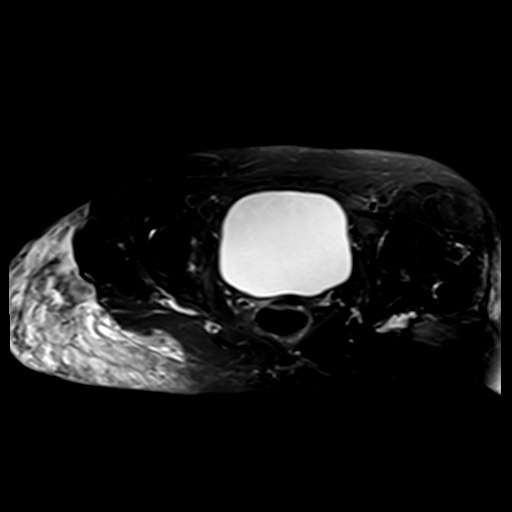
[im 25/30]
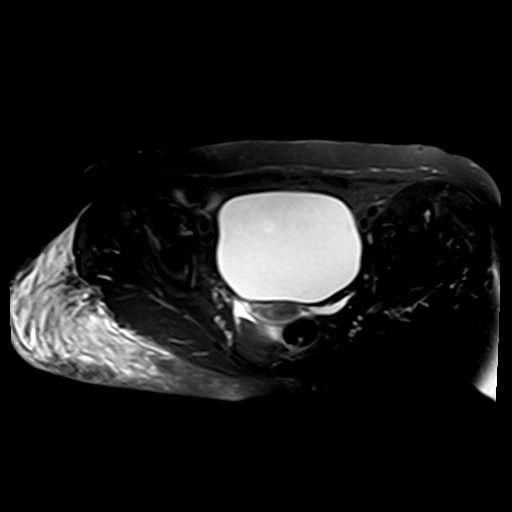
[im 30/30]
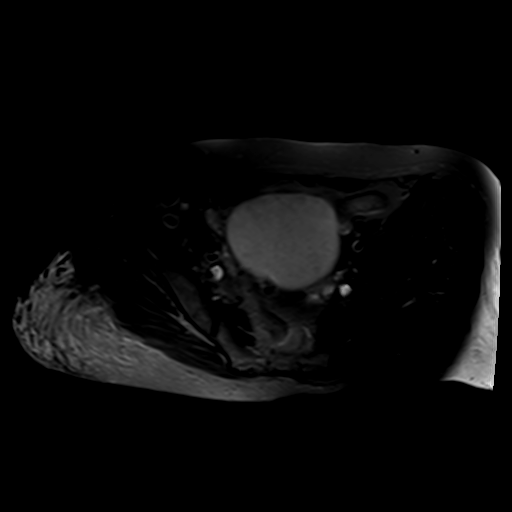

[Series 11: PD fat-sat · sagittal · right · 4.0mm · 0.78mm/px · 7 of 29 slices shown (1 of 2)]
[im 1/29]
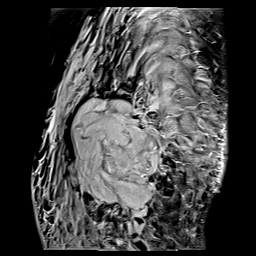
[im 5/29]
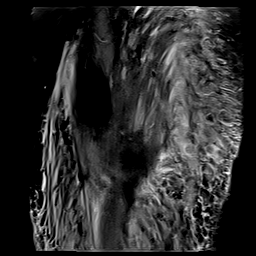
[im 10/29]
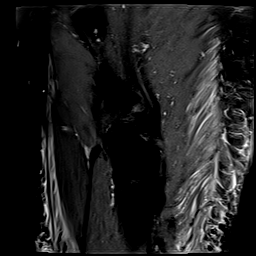
[im 15/29]
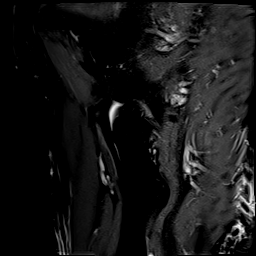
[im 19/29]
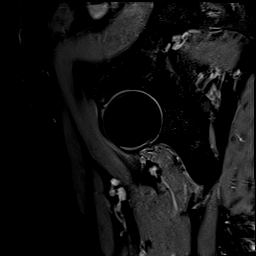
[im 24/29]
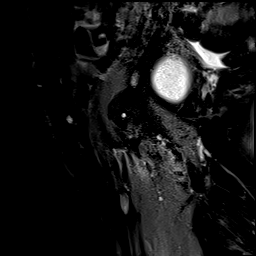
[im 29/29]
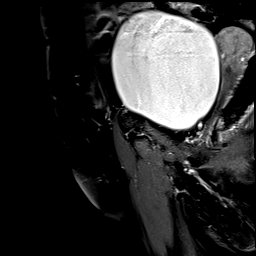

[Series 12: PD fat-sat · coronal · right · 4.0mm · 0.78mm/px · 7 of 29 slices shown (2 of 2)]
[im 1/29]
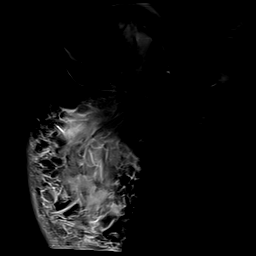
[im 5/29]
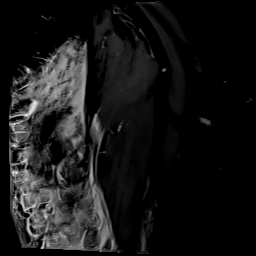
[im 10/29]
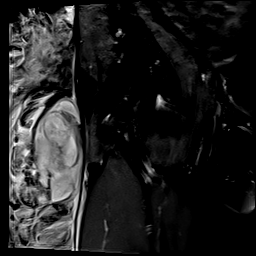
[im 15/29]
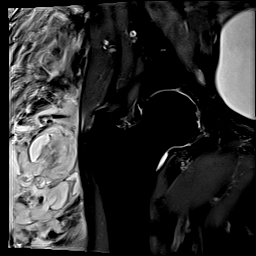
[im 19/29]
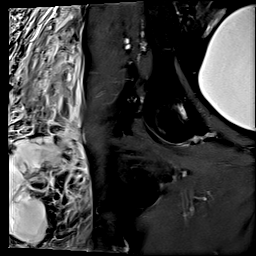
[im 24/29]
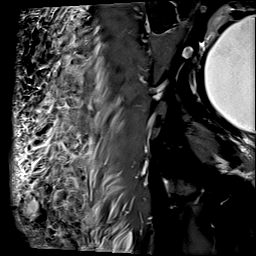
[im 29/29]
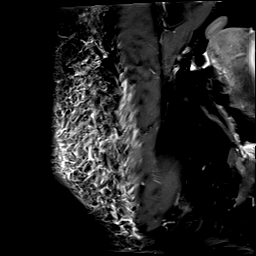

[39 of 40 positions shown; findings below may reference images not displayed]

FINDINGS: Bones:

No hip fracture, dislocation or avascular necrosis.

No periosteal reaction or bone destruction. No aggressive osseous
lesion.

Normal sacrum and sacroiliac joints. No SI joint widening or erosive
changes.

Degenerative disease with disc height loss at L3-4, L4-5 and L5-S1.

Articular cartilage and labrum

Articular cartilage: Mild partial-thickness cartilage loss of the
femoral head and acetabulum bilaterally.

Labrum: Grossly intact, but evaluation is limited by lack of
intraarticular fluid.

Joint or bursal effusion

Joint effusion:  No hip joint effusion.  No SI joint effusion.

Bursae:  No bursa formation.

Muscles and tendons

Flexors: Normal.

Extensors: Normal.

Abductors: Normal.

Adductors: Normal.

Gluteals: Normal.

Hamstrings: Normal.

Other findings

No pelvic free fluid. No fluid collection or hematoma. No inguinal
lymphadenopathy. No inguinal hernia.

Severe soft tissue contusion involving the subcutaneous fat
overlying the right hip. 8.2 x 3.4 x 8.7 cm hematoma in the
subcutaneous fat overlying the right hip.
IMPRESSION: 1. No hip fracture, dislocation or avascular necrosis.
2. Severe hemorrhagic soft tissue contusion involving the
subcutaneous fat overlying the right hip with a 8.2 x 3.4 x 8.7 cm
hematoma.

## 2022-01-19 IMAGING — CT CT HEAD W/O CM
4 series · 16 of 47 positions shown, 18 images · non-contrast
Comparison: None.

CLINICAL DATA: The fall

Head trauma
EXAM:
CT HEAD WITHOUT CONTRAST
TECHNIQUE: Contiguous axial images were obtained from the base of the skull
through the vertex without intravenous contrast.
RADIATION DOSE REDUCTION: This exam was performed according to the
departmental dose-optimization program which includes automated
exposure control, adjustment of the mA and/or kV according to
patient size and/or use of iterative reconstruction technique.

[Series 2: head wo · axial · 0.39mm/px · z∈[-102,+13]mm · 7 of 31 slices shown, 9 images]
[im 4/31  brain]
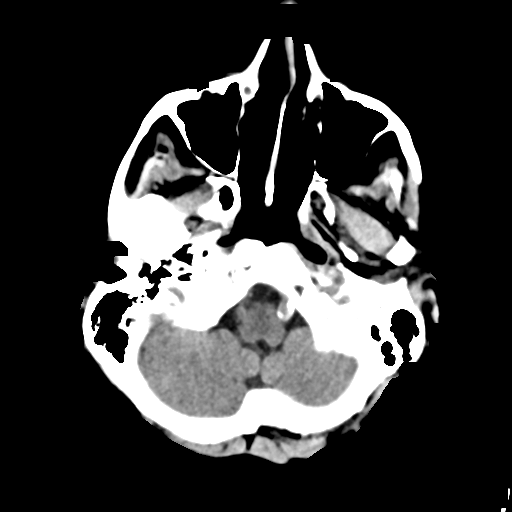
[im 4/31  bone]
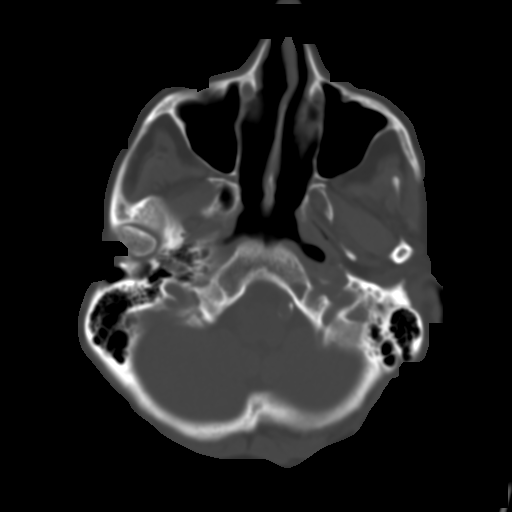
[im 8/31  brain]
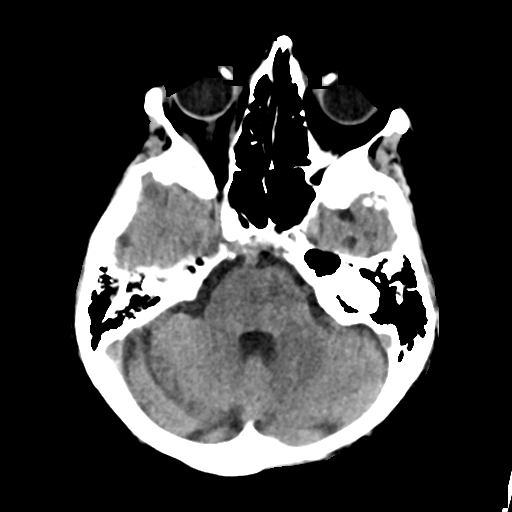
[im 12/31  brain]
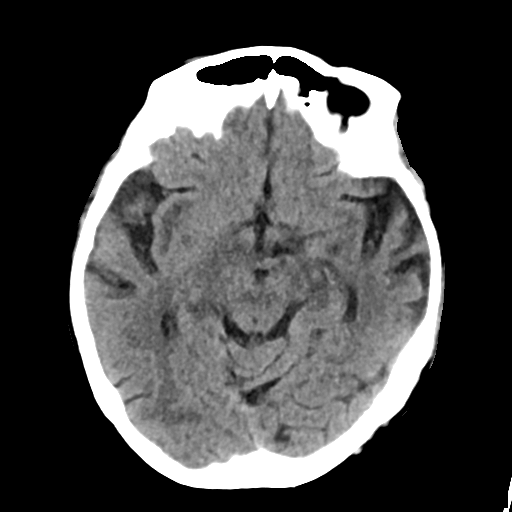
[im 16/31  brain]
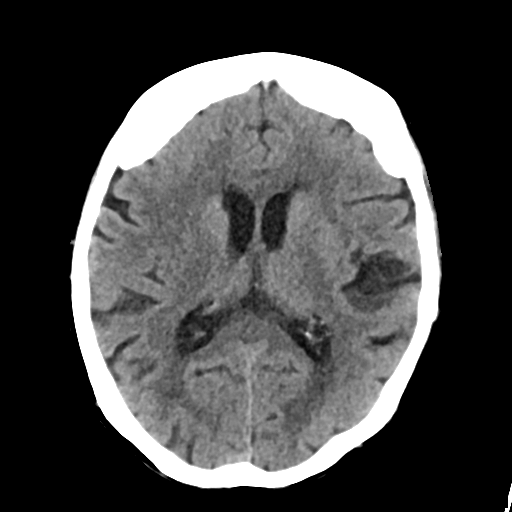
[im 19/31  brain]
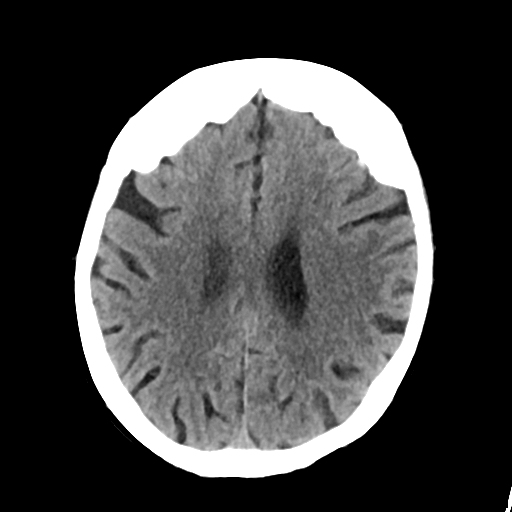
[im 19/31  bone]
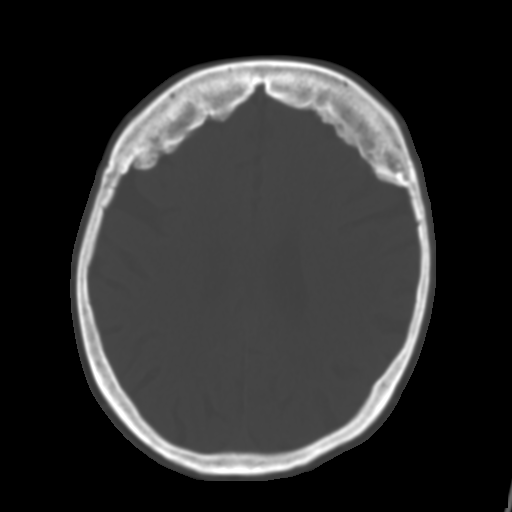
[im 23/31  brain]
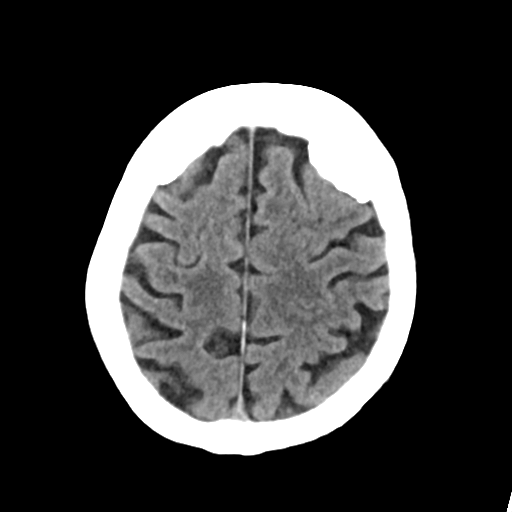
[im 27/31  brain]
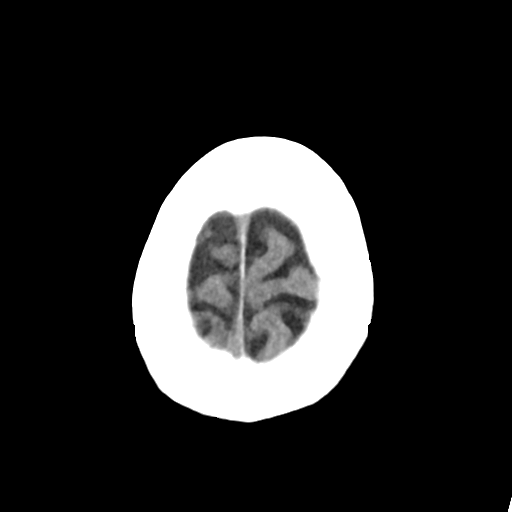

[Series 3: head bone · axial · 0.39mm/px · z∈[-103,-73]mm · 3 of 77 slices shown]
[im 8/77  bone]
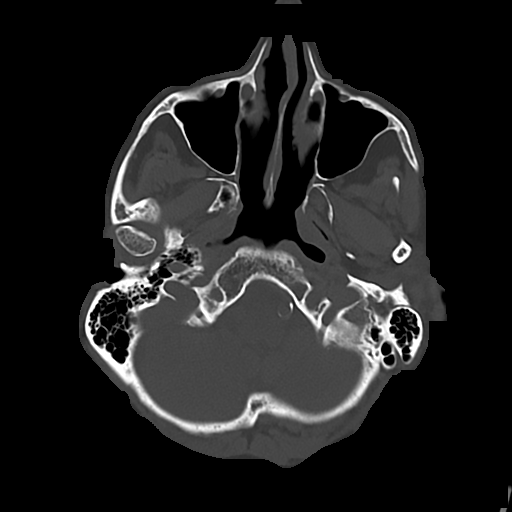
[im 16/77  bone]
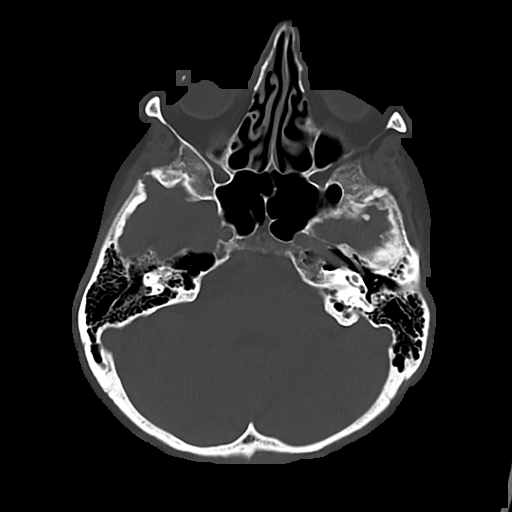
[im 23/77  bone]
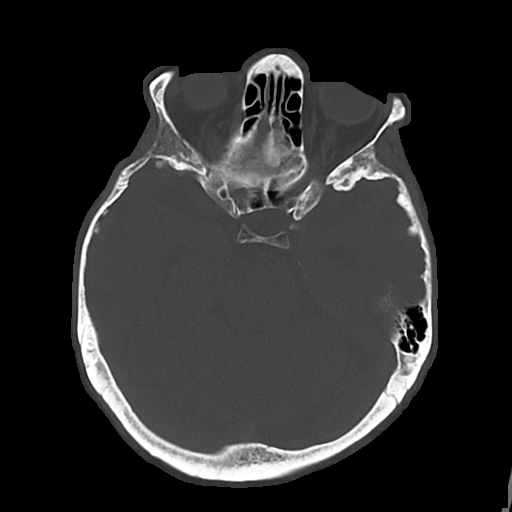

[Series 4: cor soft · coronal · 0.31mm/px · 3 of 65 slices shown]
[im 22/65  brain]
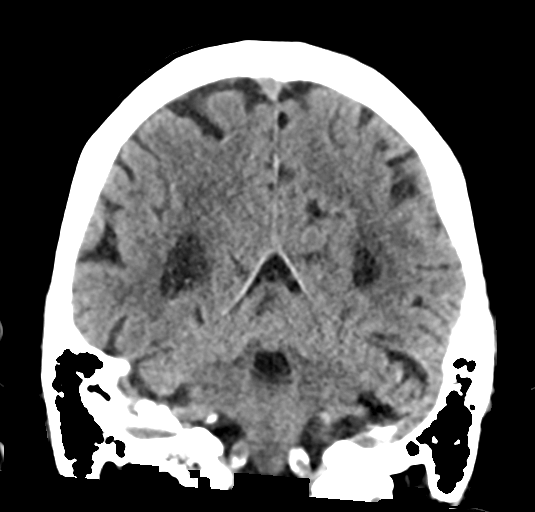
[im 29/65  brain]
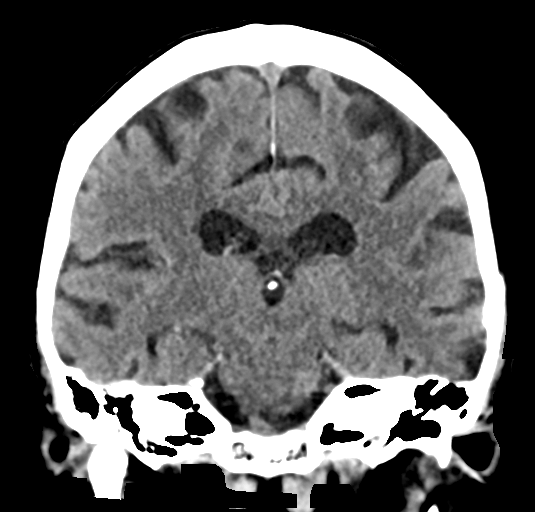
[im 36/65  brain]
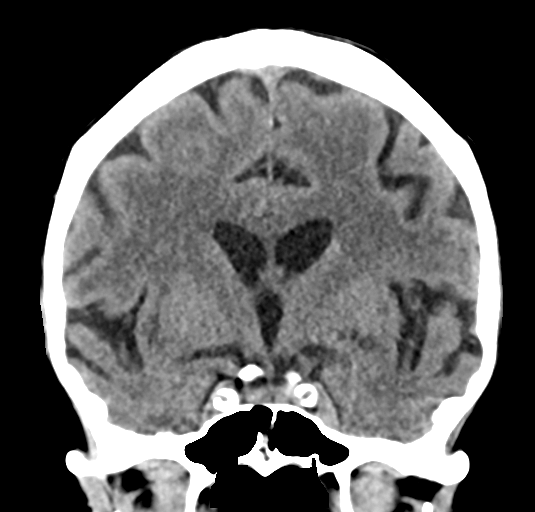

[Series 5: sag soft · sagittal · 0.30mm/px · 3 of 59 slices shown]
[im 20/59  brain]
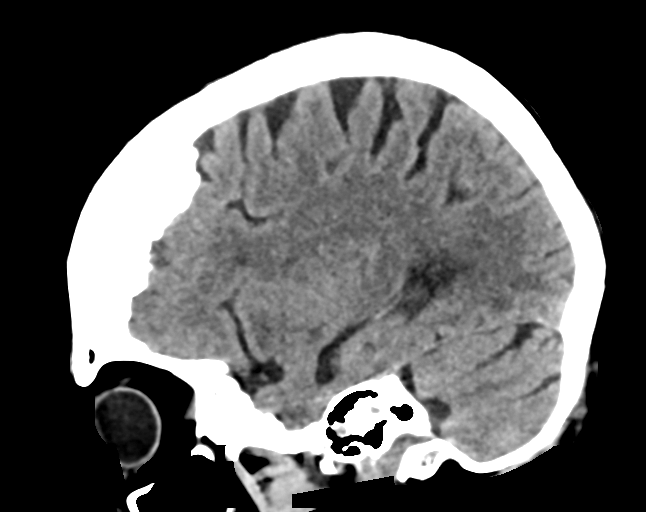
[im 30/59  brain]
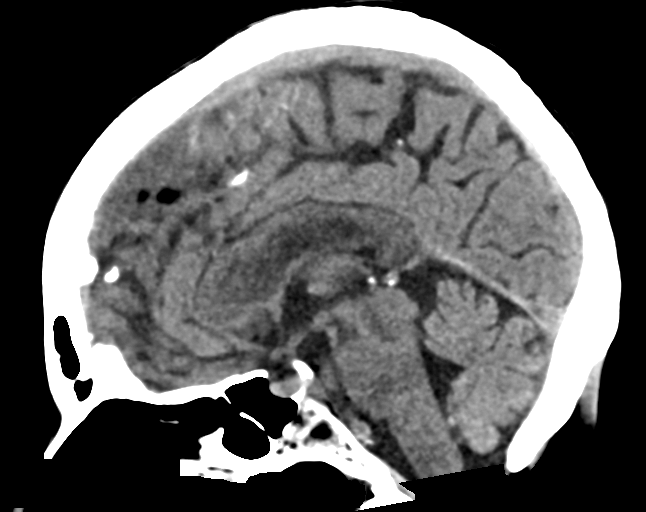
[im 39/59  brain]
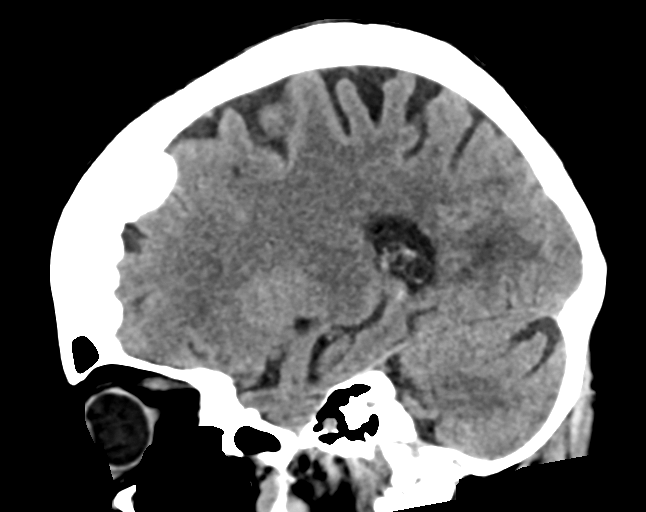

[16 of 47 positions shown; findings below may reference images not displayed]

FINDINGS: Brain: No evidence of acute infarction, hemorrhage, hydrocephalus,
extra-axial collection or mass lesion/mass effect. Diffuse cortical
atrophy. Periventricular white matter hypodensity is a nonspecific
finding, but most commonly relates to chronic ischemic small vessel
disease. Focal hypodensity in the left basal ganglia is likely a
prominent space Virchow-Robin.

Vascular: No hyperdense vessel or unexpected calcification.

Skull: Normal. Negative for fracture or focal lesion.

Sinuses/Orbits: No acute finding.

Other: None.
IMPRESSION: No acute intracranial abnormality.

## 2022-01-19 IMAGING — CT CT CERVICAL SPINE W/O CM
3 of 4 series · 12 of 33 positions shown, 14 images · non-contrast
Comparison: None.

CLINICAL DATA: Fall



[Series 5: sag bone · sagittal · 0.21mm/px · 5 of 61 slices shown, 6 images]
[im 21/61  bone]
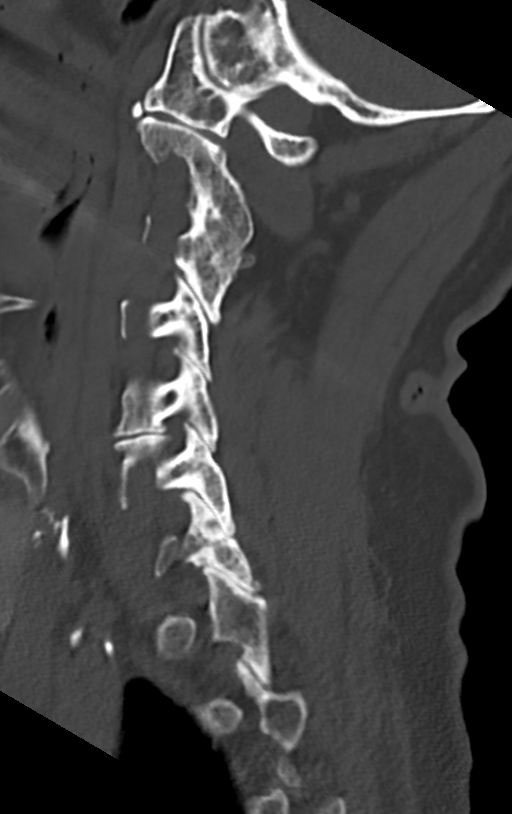
[im 26/61  bone]
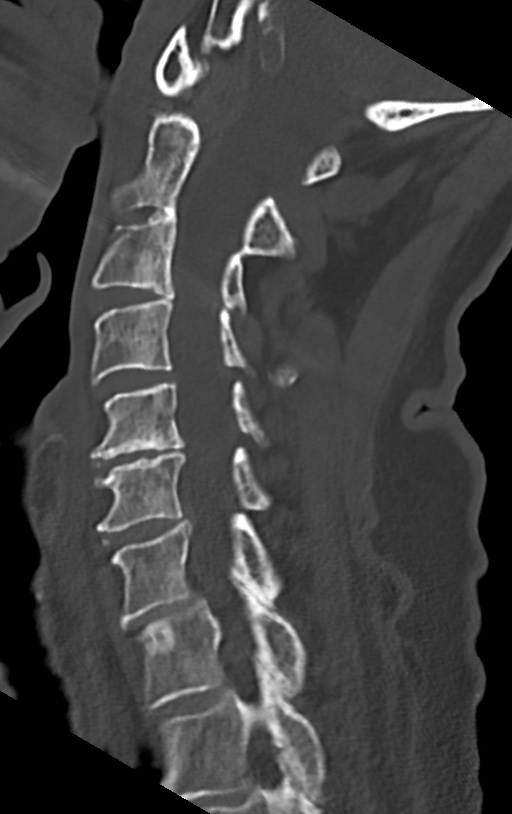
[im 31/61  soft-tissue]
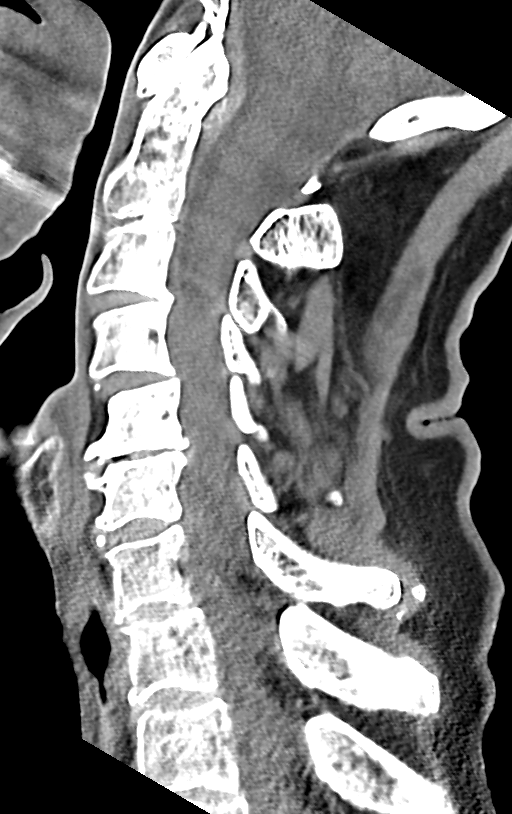
[im 31/61  bone]
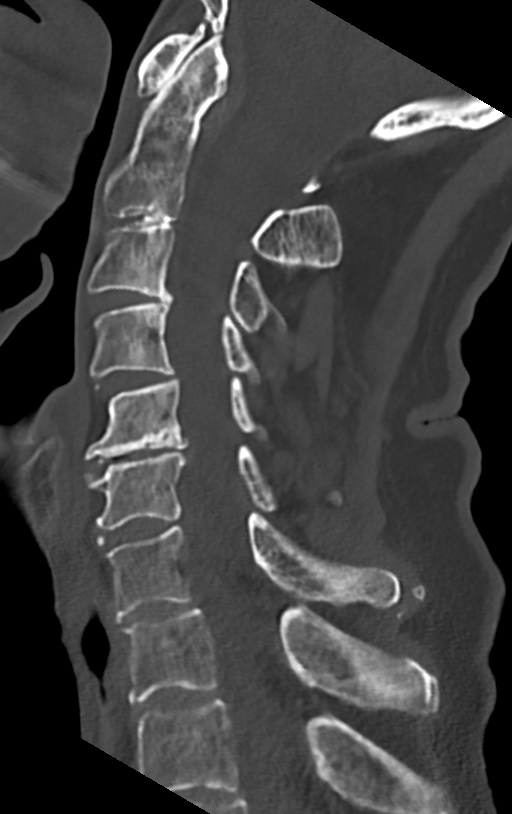
[im 36/61  bone]
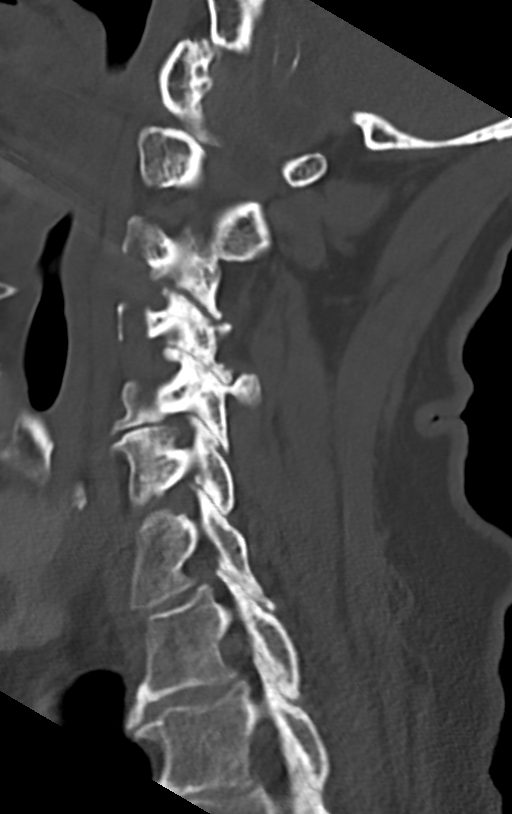
[im 41/61  bone]
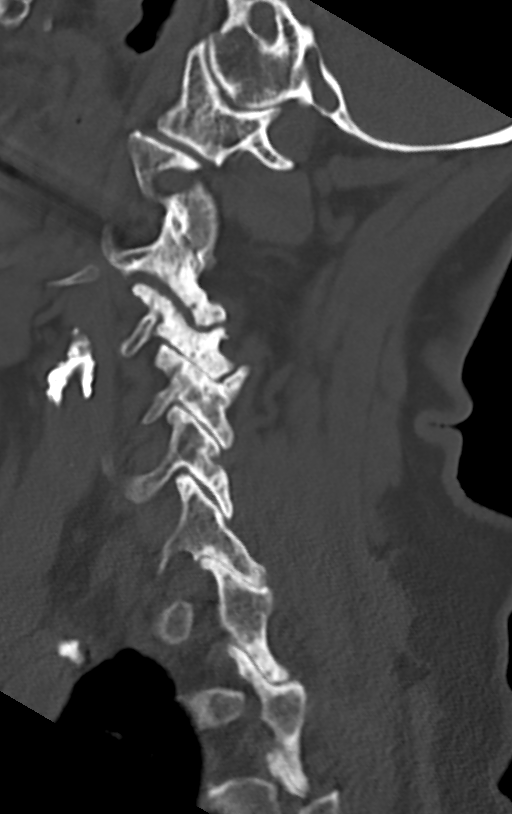

[Series 6: cor bone · coronal · 0.24mm/px · 3 of 53 slices shown]
[im 12/53  bone]
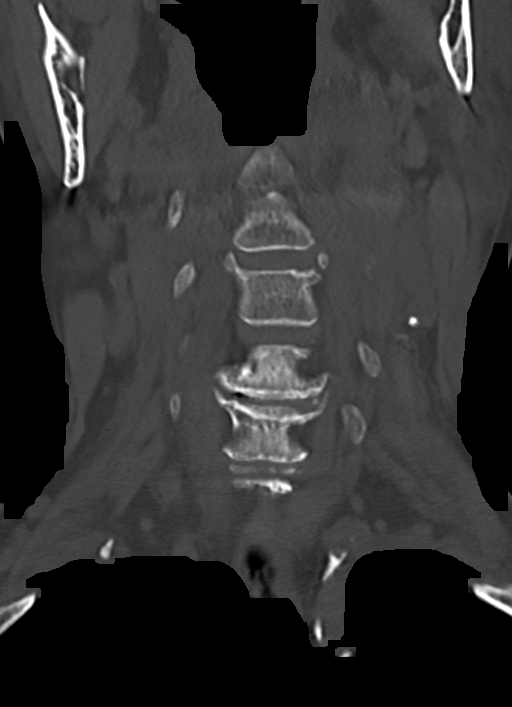
[im 22/53  bone]
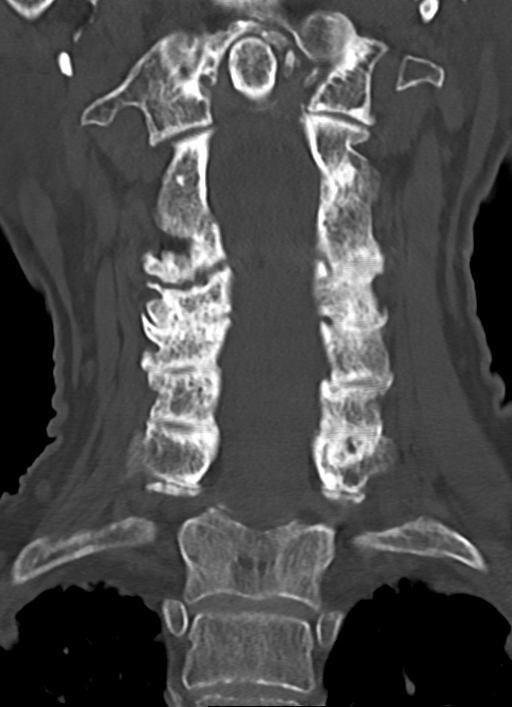
[im 31/53  bone]
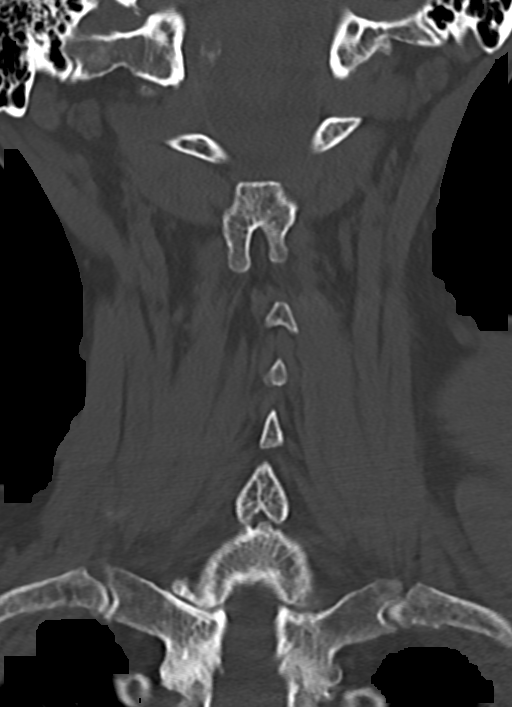

[Series 7: orthogonal axials · axial · 0.21mm/px · z∈[-249,-153]mm · 4 of 84 slices shown, 5 images]
[im 14/84  soft-tissue]
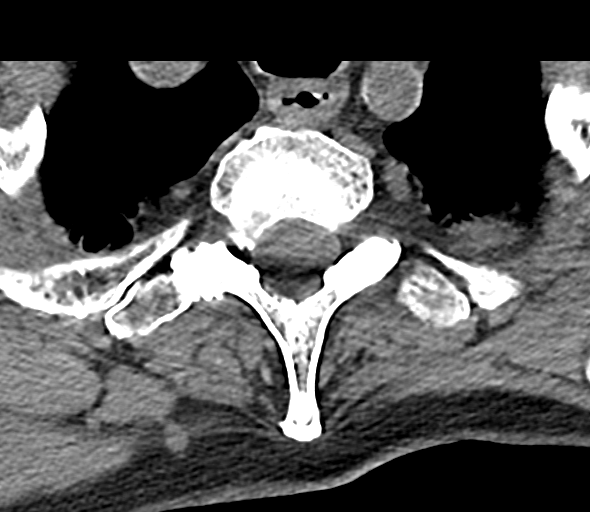
[im 14/84  bone]
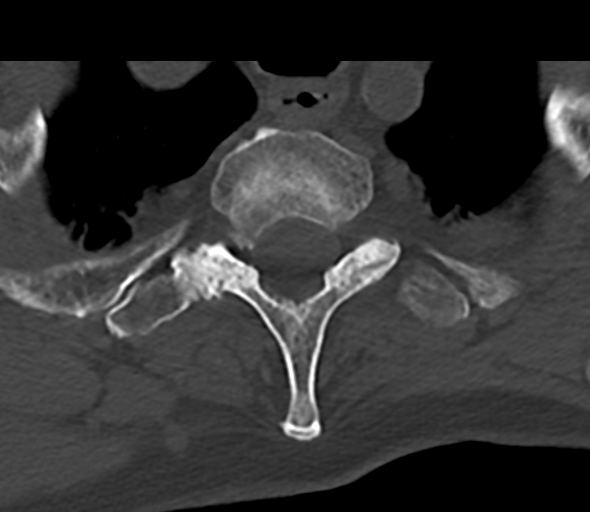
[im 28/84  bone]
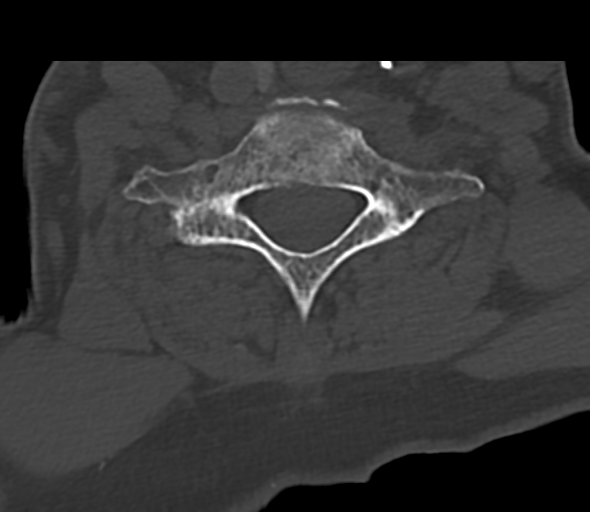
[im 56/84  bone]
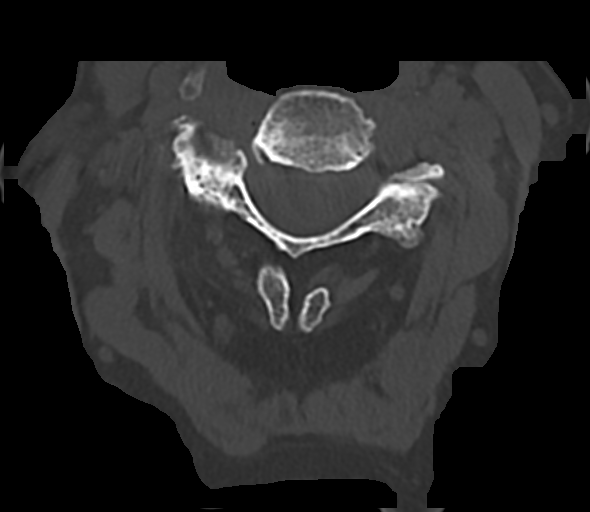
[im 70/84  bone]
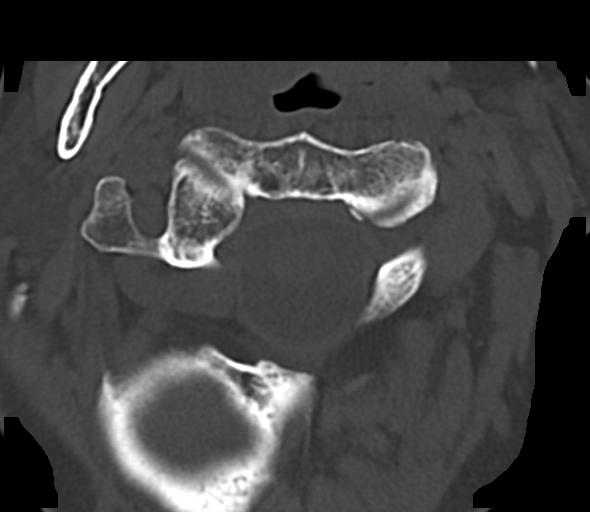

[12 of 33 positions shown; findings below may reference images not displayed]

FINDINGS: Alignment: Within normal limits

Skull base and vertebrae: No acute fracture. No primary bone lesion
or focal pathologic process.

Soft tissues and spinal canal: Extensive atherosclerotic
calcifications of the carotid bulbs. 0.8 cm right thyroid nodule
does not require further imaging follow-up.

Disc levels: Degenerative changes seen throughout the cervical
spine, greatest at C5-C6.

Upper chest: Negative.

Other: None.
IMPRESSION: No acute fracture or dislocation of the cervical or visualized upper
thoracic spine.

## 2022-01-19 IMAGING — CR DG HIP (WITH OR WITHOUT PELVIS) 2-3V*R*
1 series · 3 of 3 positions shown · non-contrast
Comparison: Report of right hip series [DATE] (no images
available).

CLINICAL DATA: 85-year-old female status post fall last night,
found down.

EXAM:
DG HIP (WITH OR WITHOUT PELVIS) 2-3V RIGHT

[Series 1: dg hip unilat w or w/o pelvis 2-3 views  · non-contrast · 0.14mm/px · 3 of 3 slices shown]
[im 1/3]
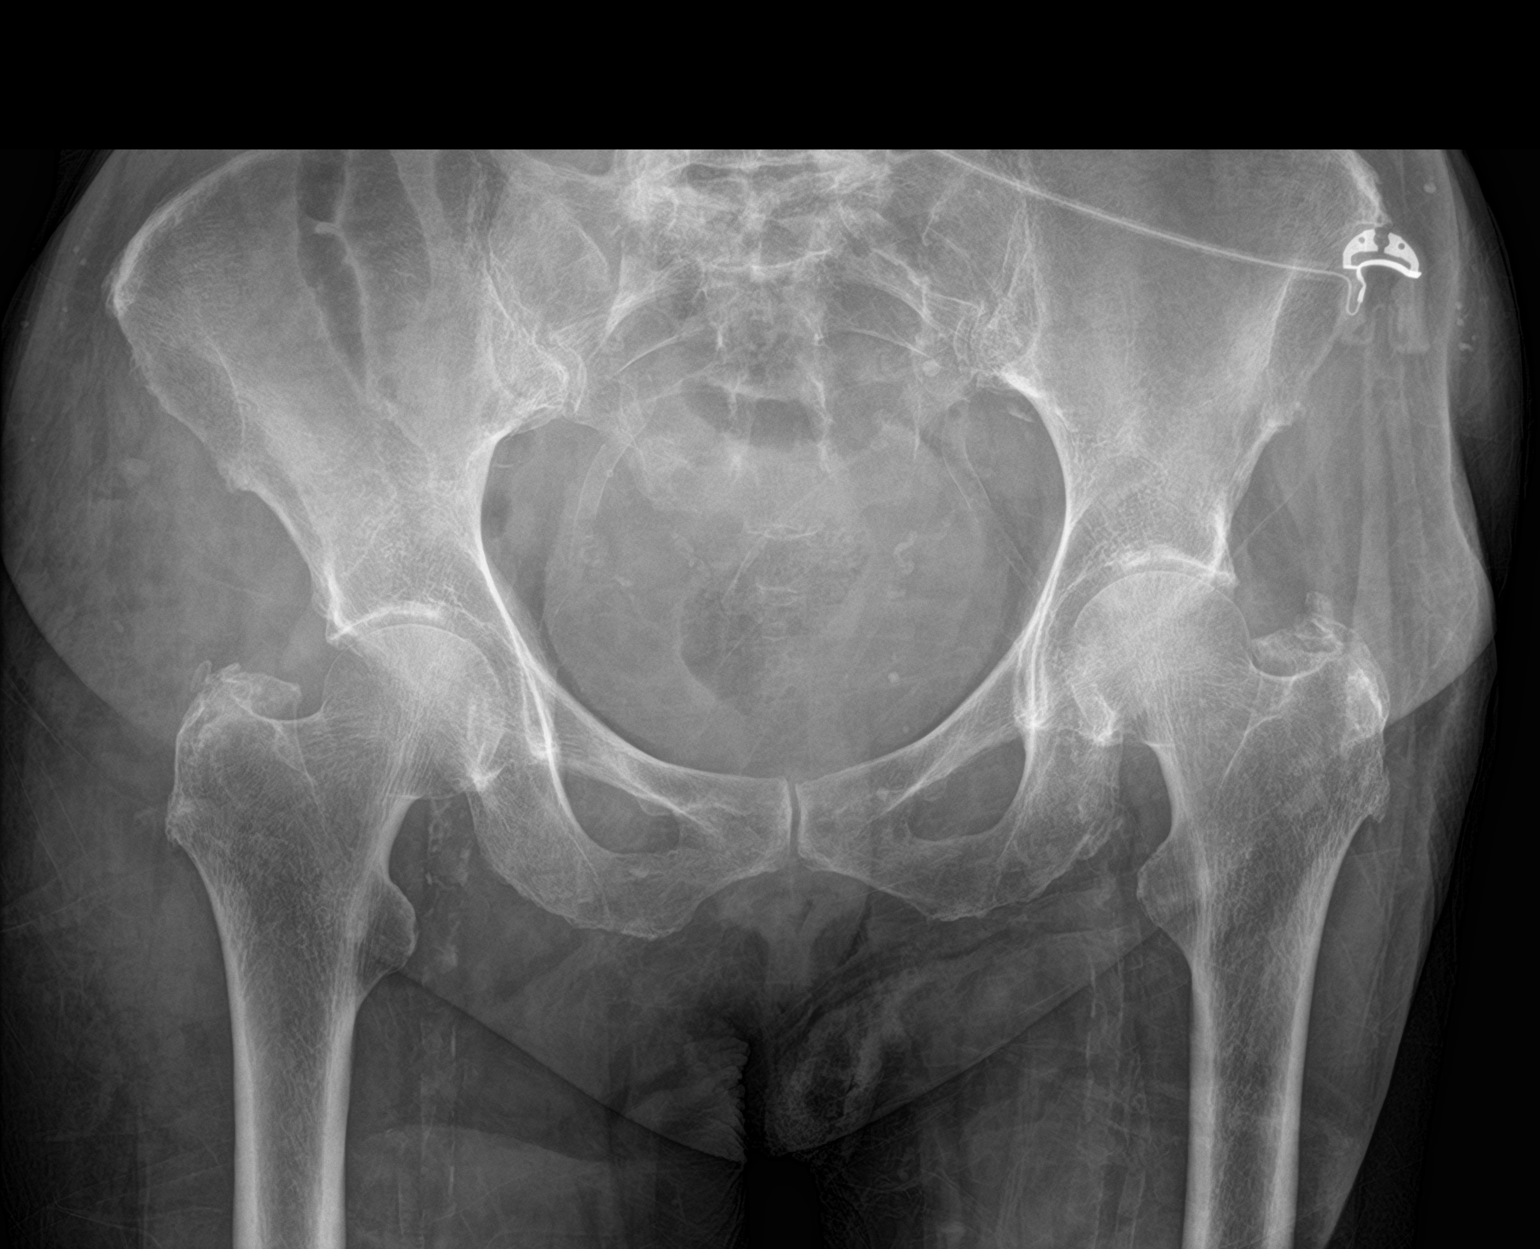
[im 2/3]
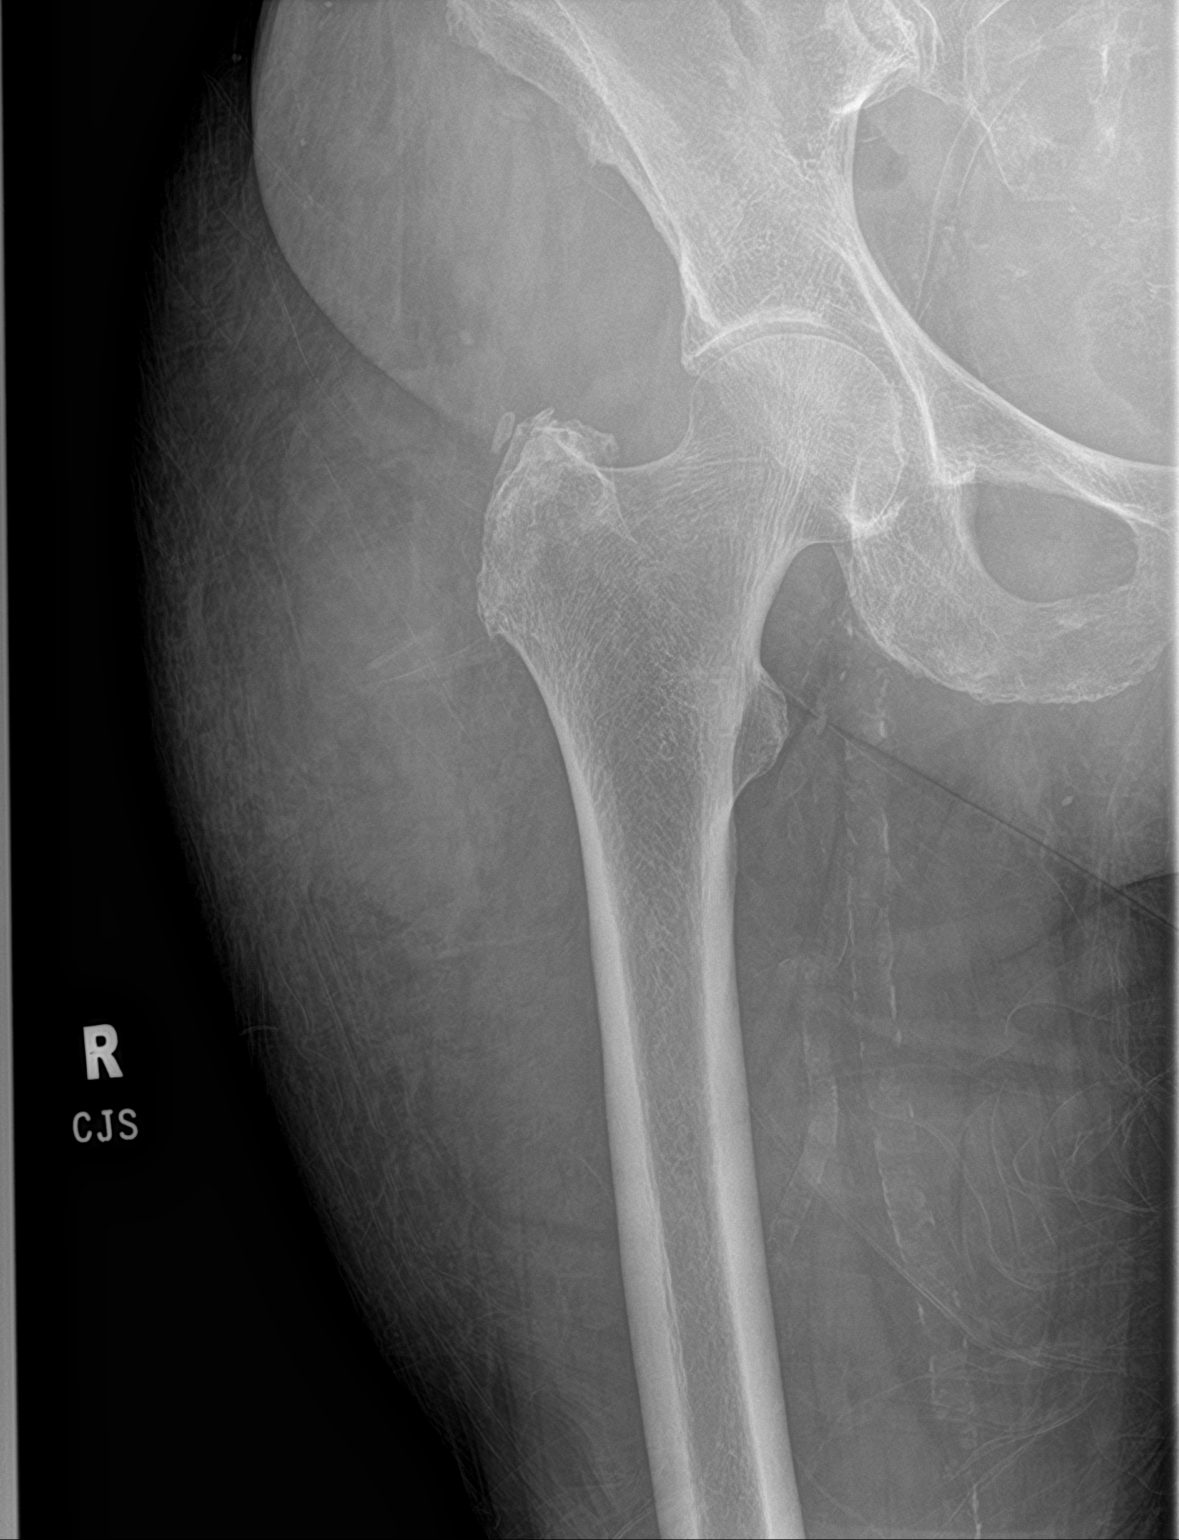
[im 3/3]
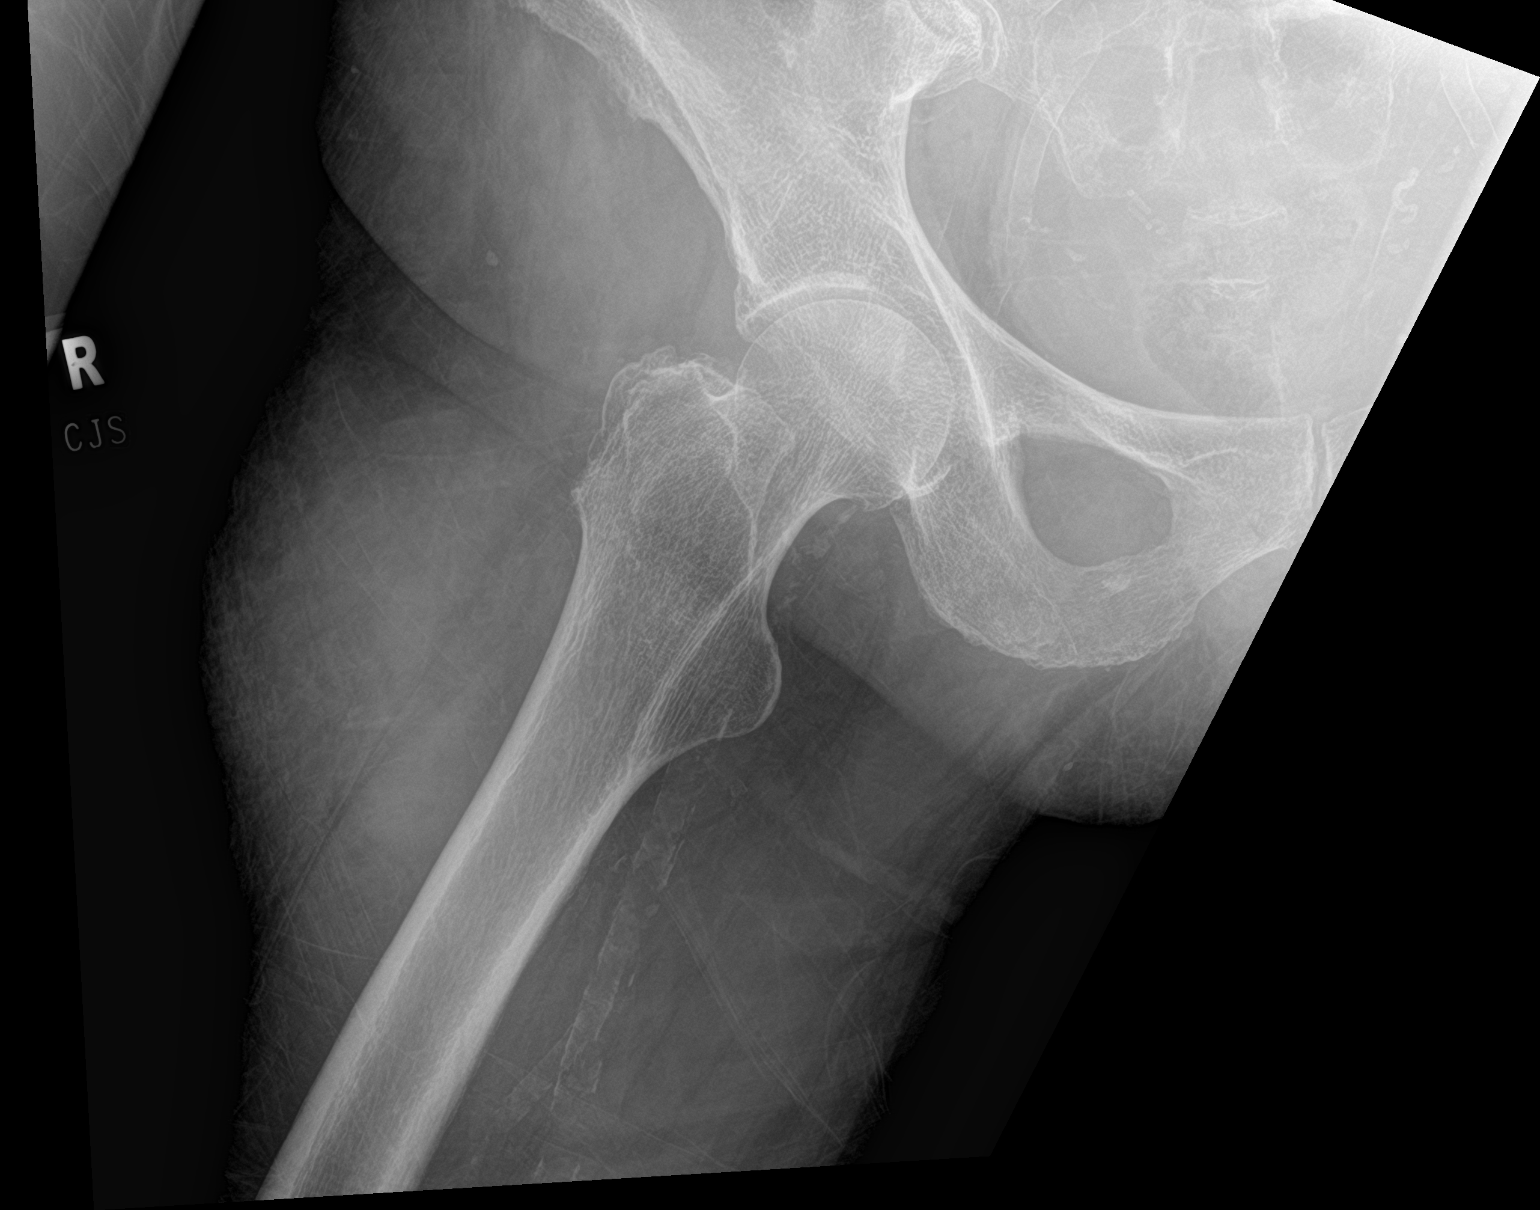

[3 of 3 positions shown; findings below may reference images not displayed]

FINDINGS: Calcified iliofemoral atherosclerosis. Femoral heads are normally
located. Pelvis appears intact. Symmetric SI joints. Urinary bladder
appears mildly distended, otherwise negative visible lower abdominal
and pelvic visceral contours. Grossly intact proximal left femur.
Intact proximal right femur.
IMPRESSION: No acute fracture or dislocation identified about the right hip or
pelvis.

If occult hip fracture is suspected or if the patient is unable to
weightbear, MRI is the preferred modality for further evaluation.

## 2022-01-19 MED ORDER — OXYCODONE HCL 5 MG PO TABS: 5.0000 mg | ORAL_TABLET | ORAL | Status: DC | PRN

## 2022-01-19 MED ORDER — HYDROCHLOROTHIAZIDE 25 MG PO TABS
25.0000 mg | ORAL_TABLET | Freq: Every day | ORAL | Status: DC
  Administered 2022-01-19 – 2022-01-20 (×2): 25 mg via ORAL
  Filled 2022-01-19 (×2): qty 1

## 2022-01-19 MED ORDER — ONDANSETRON HCL 4 MG PO TABS
4.0000 mg | ORAL_TABLET | Freq: Four times a day (QID) | ORAL | Status: DC | PRN
Start: 2022-01-19 — End: 2022-01-26

## 2022-01-19 MED ORDER — ENALAPRIL MALEATE 20 MG PO TABS
20.0000 mg | ORAL_TABLET | Freq: Every day | ORAL | Status: DC
  Administered 2022-01-20: 20 mg via ORAL
  Filled 2022-01-19 (×2): qty 1

## 2022-01-19 MED ORDER — PRAVASTATIN SODIUM 20 MG PO TABS
40.0000 mg | ORAL_TABLET | Freq: Every day | ORAL | Status: DC
  Administered 2022-01-19 – 2022-01-25 (×7): 40 mg via ORAL
  Filled 2022-01-19 (×7): qty 2

## 2022-01-19 MED ORDER — CYANOCOBALAMIN 500 MCG PO TABS
500.0000 ug | ORAL_TABLET | Freq: Every day | ORAL | Status: DC
  Administered 2022-01-20 – 2022-01-26 (×7): 500 ug via ORAL
  Filled 2022-01-19 (×7): qty 1

## 2022-01-19 MED ORDER — TRAZODONE HCL 50 MG PO TABS
25.0000 mg | ORAL_TABLET | Freq: Every evening | ORAL | Status: DC | PRN
  Administered 2022-01-23: 25 mg via ORAL
  Filled 2022-01-19 (×2): qty 1

## 2022-01-19 MED ORDER — ACETAMINOPHEN 650 MG RE SUPP: 650.0000 mg | Freq: Four times a day (QID) | RECTAL | Status: DC | PRN

## 2022-01-19 MED ORDER — ACETAMINOPHEN 325 MG PO TABS: 650.0000 mg | ORAL_TABLET | Freq: Four times a day (QID) | ORAL | Status: DC | PRN

## 2022-01-19 MED ORDER — POLYETHYLENE GLYCOL 3350 17 G PO PACK
17.0000 g | PACK | Freq: Every day | ORAL | Status: DC | PRN
  Administered 2022-01-26: 17 g via ORAL
  Filled 2022-01-19: qty 1

## 2022-01-19 MED ORDER — ESCITALOPRAM OXALATE 10 MG PO TABS
5.0000 mg | ORAL_TABLET | Freq: Every day | ORAL | Status: DC
  Administered 2022-01-19 – 2022-01-26 (×8): 5 mg via ORAL
  Filled 2022-01-19 (×4): qty 1
  Filled 2022-01-19: qty 0.5
  Filled 2022-01-19 (×4): qty 1

## 2022-01-19 MED ORDER — ALBUTEROL SULFATE (2.5 MG/3ML) 0.083% IN NEBU: 2.5000 mg | INHALATION_SOLUTION | RESPIRATORY_TRACT | Status: DC | PRN

## 2022-01-19 MED ORDER — ONDANSETRON HCL 4 MG/2ML IJ SOLN: 4.0000 mg | Freq: Four times a day (QID) | INTRAMUSCULAR | Status: DC | PRN

## 2022-01-19 MED ORDER — METOPROLOL SUCCINATE ER 25 MG PO TB24
25.0000 mg | ORAL_TABLET | Freq: Two times a day (BID) | ORAL | Status: DC
  Administered 2022-01-19 – 2022-01-20 (×3): 25 mg via ORAL
  Filled 2022-01-19 (×3): qty 1

## 2022-01-19 MED ORDER — POTASSIUM CHLORIDE CRYS ER 20 MEQ PO TBCR
20.0000 meq | EXTENDED_RELEASE_TABLET | Freq: Two times a day (BID) | ORAL | Status: DC
  Administered 2022-01-19 – 2022-01-21 (×5): 20 meq via ORAL
  Filled 2022-01-19 (×5): qty 1

## 2022-01-19 MED ORDER — INSULIN ASPART 100 UNIT/ML IJ SOLN
0.0000 [IU] | Freq: Three times a day (TID) | INTRAMUSCULAR | Status: DC
  Administered 2022-01-20 – 2022-01-26 (×5): 1 [IU] via SUBCUTANEOUS
  Filled 2022-01-19 (×5): qty 1

## 2022-01-19 MED ORDER — AMLODIPINE BESYLATE 10 MG PO TABS
10.0000 mg | ORAL_TABLET | Freq: Every day | ORAL | Status: DC
  Administered 2022-01-19 – 2022-01-20 (×2): 10 mg via ORAL
  Filled 2022-01-19 (×2): qty 1

## 2022-01-19 MED ORDER — SODIUM CHLORIDE 0.9% FLUSH
3.0000 mL | Freq: Two times a day (BID) | INTRAVENOUS | Status: DC
  Administered 2022-01-19 – 2022-01-26 (×13): 3 mL via INTRAVENOUS

## 2022-01-19 MED ORDER — PANTOPRAZOLE SODIUM 20 MG PO TBEC
20.0000 mg | DELAYED_RELEASE_TABLET | Freq: Every day | ORAL | Status: DC
  Administered 2022-01-19 – 2022-01-26 (×8): 20 mg via ORAL
  Filled 2022-01-19 (×9): qty 1

## 2022-01-19 MED ORDER — AMIODARONE HCL 200 MG PO TABS
200.0000 mg | ORAL_TABLET | Freq: Two times a day (BID) | ORAL | Status: DC
  Administered 2022-01-19 – 2022-01-26 (×14): 200 mg via ORAL
  Filled 2022-01-19 (×14): qty 1

## 2022-01-19 NOTE — ED Triage Notes (Addendum)
Pt to ED [facility] from [facility] for fall last night, was unable to get up until this am when son came to visit. Unsure if hit head, no obvious injuries. No blood thinner use. C/o right hip pain, no shortening or rotation, tender with palpation.  ?Family reports chronic cognitive impairment.  ?

## 2022-01-19 NOTE — ED Notes (Signed)
Pt placed on posey alarm  ? ?

## 2022-01-19 NOTE — Progress Notes (Signed)
Admission profile updated. ?

## 2022-01-19 NOTE — ED Notes (Signed)
Informed RN bed assigned 

## 2022-01-19 NOTE — Assessment & Plan Note (Addendum)
Per family there have been worsening signs of dementia over the past year or 2.  Has not had any's extensive work-up yet that I can see.  Some features such as her difficulty with word finding raise concern for possible stroke, though she has no other deficits. ?- Check B12, TSH, HIV, RPR ?- Consider MRI head while admitted ?- Ensure patient follows up with dementia clinic next month ?

## 2022-01-19 NOTE — ED Provider Notes (Signed)
? ?[facility] ?Provider Note ? ? ? Event Date/Time  ? First MD Initiated Contact with Patient 01/19/22 [phone]   ?  (approximate) ? ? ?History  ? ?Fall ? ? ?HPI ? ?Terry Kaiser is a 86 y.o. female who comes from independent living at [facility].  She apparently fell last night and was unable to get up until her son visited her this morning.  Son reportedly told EMS that the patient is getting more more confused and having more more memory problems.  She often does not remember what day it is or where she is.  This is the case now.  Patient does complain of a lot of pain in her right hip. ? ?  ? ? ?Physical Exam  ? ?Triage Vital Signs: ?ED Triage Vitals  ?Enc Vitals Group  ?   BP 01/19/22 0926 (!) 118/58  ?   Pulse Rate 01/19/22 0926 73  ?   Resp 01/19/22 0926 16  ?   Temp 01/19/22 0927 98.1 ?F (36.7 ?C)  ?   Temp Source 01/19/22 0927 Oral  ?   SpO2 01/19/22 0926 100 %  ?   Weight 01/19/22 0926 170 lb (77.1 kg)  ?   Height 01/19/22 0926 '5\' 6"'$  (1.676 m)  ?   Head Circumference --   ?   Peak Flow --   ?   Pain Score --   ?   Pain Loc --   ?   Pain Edu? --   ?   Excl. in [place]? --   ? ? ?Most recent vital signs: ?Vitals:  ? 01/19/22 1330 01/19/22 1544  ?BP: (!) 147/77 (!) 145/80  ?Pulse: 86 84  ?Resp: 19 18  ?Temp:  98.6 ?F (37 ?C)  ?SpO2: 100% 100%  ? ? ?General: Awake, no distress.  Is oriented to person only. ?Head normocephalic atraumatic ?Eyes pupils equal round extraocular movements intact ?CV:  Good peripheral perfusion.  Heart regular rate and rhythm no audible murmur ?Resp:  Normal effort.  Lungs are clear ?Chest: No chest tenderness ?Abd:  No distention.  Soft bowel sounds positive nontender ?Extremities: Good range of motion in the arms.  Patient not moving the legs very much but has a lot of pain in the right hip. ? ? ?ED Results / Procedures / Treatments  ? ?Labs ?(all labs ordered are listed, but only abnormal results are displayed) ?Labs Reviewed  ?CBC WITH DIFFERENTIAL/PLATELET -  Abnormal; Notable for the following components:  ?    Result Value  ? RBC 3.24 (*)   ? Hemoglobin 9.6 (*)   ? HCT 30.0 (*)   ? Neutro Abs 7.8 (*)   ? Lymphs Abs 0.6 (*)   ? All other components within normal limits  ?COMPREHENSIVE METABOLIC PANEL - Abnormal; Notable for the following components:  ? Potassium 3.1 (*)   ? Glucose, Bld 204 (*)   ? BUN 31 (*)   ? Calcium 8.7 (*)   ? Total Protein 6.3 (*)   ? All other components within normal limits  ?HEMOGLOBIN AND HEMATOCRIT, BLOOD - Abnormal; Notable for the following components:  ? Hemoglobin 9.3 (*)   ? HCT 28.7 (*)   ? All other components within normal limits  ?CK  ?CBC  ?APTT  ?PROTIME-INR  ?VITAMIN B12  ?FERRITIN  ?IRON AND TIBC  ?RPR  ?HIV ANTIBODY (ROUTINE TESTING W REFLEX)  ?TSH  ?TYPE AND SCREEN  ? ? ? ?EKG ? ?EKG read and  interpreted by me shows a flutter with a rate of 73 normal axis no acute ST-T changes there is reading elevation of ST segment in the lateral leads this is not correct. ? ? ?RADIOLOGY ?X-ray films of the patient's hip reviewed by radiologist and myself shows no fracture ?CT of the head and neck read by radiology films reviewed by me show no fractures or other acute problems ?MRI of the hip done since the hip was so painful shows only a large hematoma.  No fracture I reviewed those films as well ? ? ?PROCEDURES: ? ?Critical Care performed:  ? ?Procedures ? ? ?MEDICATIONS ORDERED IN ED: ?Medications  ?amiodarone (PACERONE) tablet 200 mg (has no administration in time range)  ?amLODipine (NORVASC) tablet 10 mg (has no administration in time range)  ?enalapril (VASOTEC) tablet 20 mg (has no administration in time range)  ?hydrochlorothiazide (HYDRODIURIL) tablet 25 mg (has no administration in time range)  ?pravastatin (PRAVACHOL) tablet 40 mg (has no administration in time range)  ?metoprolol succinate (TOPROL-XL) 24 hr tablet 25 mg (has no administration in time range)  ?escitalopram (LEXAPRO) tablet 5 mg (has no administration in time  range)  ?pantoprazole (PROTONIX) EC tablet 20 mg (has no administration in time range)  ?vitamin B-12 (CYANOCOBALAMIN) tablet 500 mcg (has no administration in time range)  ?potassium chloride SA (KLOR-CON M) CR tablet 20 mEq (has no administration in time range)  ?insulin aspart (novoLOG) injection 0-6 Units (has no administration in time range)  ?sodium chloride flush (NS) 0.9 % injection 3 mL (has no administration in time range)  ?acetaminophen (TYLENOL) tablet 650 mg (has no administration in time range)  ?  Or  ?acetaminophen (TYLENOL) suppository 650 mg (has no administration in time range)  ?oxyCODONE (Oxy IR/ROXICODONE) immediate release tablet 5 mg (has no administration in time range)  ?traZODone (DESYREL) tablet 25 mg (has no administration in time range)  ?polyethylene glycol (MIRALAX / GLYCOLAX) packet 17 g (has no administration in time range)  ?ondansetron (ZOFRAN) tablet 4 mg (has no administration in time range)  ?  Or  ?ondansetron (ZOFRAN) injection 4 mg (has no administration in time range)  ?albuterol (PROVENTIL) (2.5 MG/3ML) 0.083% nebulizer solution 2.5 mg (has no administration in time range)  ? ? ? ?IMPRESSION / MDM / ASSESSMENT AND PLAN / ED COURSE  ?I reviewed the triage vital signs and the nursing notes. ?Patient with increasing dementia per her son.  He has been thinking of transferring her to a care facility with a memory care unit.  Additionally since the patient fell today she has been unable to walk and actually passed out after falling this is possibly due to the pain or possibly due to blood loss into the subcu tissues.  Her H&H has dropped considerably since the last H&H we have from many years ago.  Either way she is unsafe to return to her current living conditions.  We will admit her and watch her as she does have the bleeding and she is on Xarelto.  We will then attempt to get her into a skilled nursing facility once she has been determined to be stable ?She will need a  syncope work-up. ? ? ? ?FINAL CLINICAL IMPRESSION(S) / ED DIAGNOSES  ? ?Final diagnoses:  ?Fall, initial encounter  ?Subcutaneous hematoma  ?Syncope and collapse  ?Anemia, unspecified type  ? ? ? ?Rx / DC Orders  ? ?ED Discharge Orders   ? ? None  ? ?  ? ? ? ?Note:  This document was prepared using Dragon voice recognition software and may include unintentional dictation errors. ?  ?Nena Polio, MD ?01/19/22 1615 ? ?

## 2022-01-19 NOTE — ED Notes (Signed)
Pt had BM. Pt cleaned and changed, bed changed as well.  ?

## 2022-01-19 NOTE — Evaluation (Signed)
Occupational Therapy Evaluation ?Patient Details ?Name: Terry Kaiser ?MRN: [id number] ?DOB: 1936-05-01 ?Today's Date: 01/19/2022 ? ? ?History of Present Illness Terry Kaiser is a 86 y.o. female with medical history significant for emphysema, mild aortic regurgitation, mitral valve regurgitation, persistent A-fib on Eliquis, type 2 diabetes controlled with diet, hypertension, chronic lower extremity edema, diverticulosis, chronic anemia, who presents from home after a fall. Immaging negative for acute fracture. Pt with large hematoma on R hip.  ? ?Clinical Impression ?  ?Terry Kaiser was seen for OT evaluation this date. Prior to hospital admission, pt was generally independent with ADL management. Pt lives at [place] where she regularly participates in community activities and receives assistance with meal prep. Per pt family at bedside, pt cognition has been steadily declining over last several months with a recent more rapid decline in last moth. Pt has required more assistance from family with IADL management including medication assistance and shopping. Currently pt demonstrates impairments as described below (See OT problem list) which functionally limit her ability to perform ADL/self-care tasks. Pt currently requires +2 min A for bed mobility, +2 SBA for safety with STS attempt and is unable to progress mobility further 2/2 symptomatic orthostatic BP during session. See below for additional detail.  Pt would benefit from skilled OT services to address noted impairments and functional limitations (see below for any additional details) in order to maximize safety and independence while minimizing falls risk and caregiver burden. Upon hospital discharge, recommend STR to maximize pt safety and return to PLOF.  ?  ?   ? ?Recommendations for follow up therapy are one component of a multi-disciplinary discharge planning process, led by the attending physician.  Recommendations may be updated based on  patient status, additional functional criteria and insurance authorization.  ? ?Follow Up Recommendations ? Skilled nursing-short term rehab (<3 hours/day)  ?  ?Assistance Recommended at Discharge Frequent or constant Supervision/Assistance  ?Patient can return home with the following A lot of help with walking and/or transfers;Two people to help with bathing/dressing/bathroom;Assistance with cooking/housework;Direct supervision/assist for medications management;Direct supervision/assist for financial management;Help with stairs or ramp for entrance ? ?  ?Functional Status Assessment ? Patient has had a recent decline in their functional status and demonstrates the ability to make significant improvements in function in a reasonable and predictable amount of time.  ?Equipment Recommendations ? BSC/3in1  ?  ?Recommendations for Other Services   ? ? ?  ?Precautions / Restrictions Precautions ?Precautions: Fall ?Restrictions ?Weight Bearing Restrictions: No  ? ?  ? ?Mobility Bed Mobility ?Overal bed mobility: Needs Assistance ?Bed Mobility: Supine to Sit, Sit to Supine ?  ?  ?Supine to sit: Min assist, +2 for physical assistance, +2 for safety/equipment ?Sit to supine: Min assist, +2 for physical assistance, +2 for safety/equipment ?  ?  ?  ? ?Transfers ?Overall transfer level: Needs assistance ?Equipment used: Rolling walker (2 wheels) ?Transfers: Sit to/from Stand ?Sit to Stand: Supervision, +2 safety/equipment ?  ?  ?  ?  ?  ?  ?  ? ?  ?Balance Overall balance assessment: Needs assistance ?Sitting-balance support: Feet supported, Single extremity supported ?Sitting balance-Leahy Scale: Fair ?Sitting balance - Comments: steady static sitting, reaching within BOS. No LOB with weight shift during functional activity. ?  ?Standing balance support: Reliant on assistive device for balance, Bilateral upper extremity supported ?Standing balance-Leahy Scale: Fair ?Standing balance comment: Limited by dizziness. ?  ?  ?  ?  ?   ?  ?  ?  ?  ?  ?  ?   ? ?  ADL either performed or assessed with clinical judgement  ? ?ADL Overall ADL's : Needs assistance/impaired ?  ?  ?  ?  ?  ?  ?  ?  ?  ?  ?  ?  ?  ?  ?  ?  ?  ?  ?  ?General ADL Comments: Pt is functionally limited by generalized weakness, decreased activity tolerance, and orthostatic hypotension during session. She is able to don bilat hospital socks with SBA while seated at EOB.  ? ? ? ?Vision Baseline Vision/History: 1 Wears glasses ?Ability to See in Adequate Light: 2 Moderately impaired ?Patient Visual Report: No change from baseline ?   ?   ?Perception   ?  ?Praxis   ?  ? ?Pertinent Vitals/Pain Pain Assessment ?Pain Assessment: Faces ?Faces Pain Scale: Hurts little more ?Pain Location: R hip with mobiltiy ?Pain Descriptors / Indicators: Guarding, Grimacing, Sore ?Pain Intervention(s): Limited activity within patient's tolerance, Monitored during session, Repositioned  ? ? ? ?Hand Dominance Right ?  ?Extremity/Trunk Assessment Upper Extremity Assessment ?Upper Extremity Assessment: Generalized weakness ?  ?Lower Extremity Assessment ?Lower Extremity Assessment: Generalized weakness ?  ?  ?  ?Communication Communication ?Communication: Expressive difficulties ?  ?Cognition   ?Behavior During Therapy: [facility] for tasks assessed/performed ?Overall Cognitive Status: History of cognitive impairments - at baseline ?  ?  ?  ?  ?  ?  ?  ?  ?  ?  ?  ?  ?  ?  ?  ?  ?General Comments: Per family, pt cognition has been declining over the last month or so. She has required more assistance with basic tasks 2/2 this. She has difficulty with word finding and expressive language at baseline that has also increased in the last month. ?  ?  ?General Comments  BP monitored during session: Supine: 169/76, Seated: 144/76; Standing: 125/62. Pt symptomatic and endorses dizziness with positional changes. RN notified. SpO2 and HR WNL during session. ? ?  ?Exercises Other Exercises ?Other Exercises: Pt/family  educated on role of OT in acute setting, safe use of AE/DME for ADL management, and DC recs. Increased time taken to monitor vitals during mobility. ?  ?Shoulder Instructions    ? ? ?Home Living Family/patient expects to be discharged to:: Assisted living ?  ?  ?  ?  ?  ?  ?  ?  ?  ?  ?  ?  ?  ?  ?Home Equipment: Shower seat;Grab bars - tub/shower;Standard Walker;Cane - single point ?  ?Additional Comments: From [facility]. ?  ? ?  ?Prior Functioning/Environment Prior Level of Function : History of Falls (last six months);Independent/Modified Independent ?  ?  ?  ?  ?  ?  ?Mobility Comments: Per family, pt uses SPC for functional mobility at baseline. 1 other fall in the last 6 months. ?ADLs Comments: Pt is generally active/independent. Has been needing more assist for ADL/IADL management lately 2/2 cognition. Son visits frequently and provides assistance with IADL management including mediction management, shopping, etc. Pt gets her meals from [facility] facility 3x/day. Has microwave in her apartment that son states she is no longer able to use 2/2 cognition. ?  ? ?  ?  ?OT Problem List: Decreased activity tolerance;Decreased safety awareness;Impaired balance (sitting and/or standing);Decreased strength;Decreased coordination;Pain;Decreased knowledge of use of DME or AE ?  ?   ?OT Treatment/Interventions: Self-care/ADL training;Therapeutic exercise;Therapeutic activities;Balance training;DME and/or AE instruction;Patient/family education;Energy conservation;Cognitive remediation/compensation  ?  ?OT Goals(Current goals can be  found in the care plan section) Acute Rehab OT Goals ?Patient Stated Goal: To feel better ?OT Goal Formulation: With patient/family ?Time For Goal Achievement: 02/02/22 ?Potential to Achieve Goals: Good  ?OT Frequency: Min 2X/week ?  ? ?Co-evaluation   ?  ?  ?  ?  ? ?  ?AM-PAC OT "6 Clicks" Daily Activity     ?Outcome Measure Help from another person eating meals?: A Little ?Help from  another person taking care of personal grooming?: A Little ?Help from another person toileting, which includes using toliet, bedpan, or urinal?: A Lot ?Help from another person bathing (including washing, rinsing, dry

## 2022-01-19 NOTE — Assessment & Plan Note (Signed)
DM2-diet controlled at baseline, CBGs 4 times daily with very sensitive sliding scale correction factor ?A-fib- continue amiodarone, hold Eliquis in setting of hematoma, see separate problem ?HTN- continue amlodipine, enalapril, hydrochlorothiazide, metoprolol ?GERD-continue PPI ?HLD-continue statin ?Anxiety/depression- continue Lexapro ?Osteoporosis-hold Fosamax ?

## 2022-01-19 NOTE — ED Notes (Signed)
Pt to xray

## 2022-01-19 NOTE — Assessment & Plan Note (Signed)
Large hematoma seen on MRI, hemoglobin has dropped about 2 points from 11.7-9.6, will trend CBC and monitor clinically.  At this time will not resume Eliquis, consider resuming in the a.m. as long as hemoglobin is stable and hematoma is not expanding. ?

## 2022-01-19 NOTE — Assessment & Plan Note (Signed)
Continue p.o. supplementation that she is already on ?

## 2022-01-19 NOTE — H&P (Signed)
?History and Physical  ? ? ?Patient: Terry Kaiser DOB: 26-Jun-1936 ?DOA: 01/19/2022 ?DOS: the patient was seen and examined on 01/19/2022 ?PCP: Dion Body, MD  ?Patient coming from: ALF/ILF ? ?Chief Complaint:  ?Chief Complaint  ?Patient presents with  ? Fall  ? ?HPI: Terry Kaiser is a 86 y.o. female with medical history significant for emphysema, mild aortic regurgitation, mitral valve regurgitation, persistent A-fib on Eliquis, type 2 diabetes controlled with diet, hypertension, chronic lower extremity edema, diverticulosis, chronic anemia, who presents from home after a fall. ? ?Per signout from ED provider patient has been living in independent living facility.  Apparently has been having worsening dementia, fell last night while at home and laid on the floor all night as she was unable to get up. ? ?I entered the room patient was sleeping but her son Dellis Filbert was there and provided ?Collateral.  He reports that his mother has started to have somewhat of a decline since she got COVID last year.  Since that time he has noted a progressive decline, particularly problems with memory as well as problems with word finding and speaking nonsensically.  He reports that she has no prior history of stroke that he is aware of.  He has scheduled an appointment with her for a neurology dementia clinic for next month.  At baseline she ambulates with a cane and he reports she is quite independent in ambulation.  He fills her pillbox for her on a regular basis but she takes her own medications.  His understanding is that she was rolling around in bed and fell out and landed hard on her right side.  She was then unable to get up and had to crawl to her living room to get her phone to call for help.  The family have been considering transfer to a different level of care for some time now given the above issues. ? ?On my interview with patient she is pleasant, hard of hearing, and does not answer questions  quite appropriately.  She is oriented to self but said she did not know where she is, when I asked her what day of the week it was she gave a nonsensical answer. ? ?In the ED initial vital signs unremarkable.  Lab work-up showed CMP notable for potassium 3.1, glucose 204, remainder unremarkable.  CBC showed hemoglobin of 9.6 and MCV 92, hemoglobin was 11.7 on last check 5 months ago in Care Everywhere.  CT head and cervical spine without contrast showed no acute findings.  Chest x-ray was unremarkable.  Plain film of the right hip showed no acute fractures.  Given large contusion seen on exam and MRI of the hip was obtained which showed no hip fracture but severe hemorrhagic soft tissue contusion without hematoma measuring 8 x 3 x 8 cm. ? ?She was admitted her hip contusion given drop in hemoglobin and fact the patient is on blood thinners, as well as consideration of SNF placement. ? ?Review of Systems: Pertinent positives and negative per HPI, all others reviewed and negative ? ?Past Medical History:  ?Diagnosis Date  ? Arthritis   ? joints  ? Borderline diabetes mellitus 06/22/15  ? A1c 6.1%; diet controlled  ? Cancer [facility])   ? skin  ? Colon polyps   ? Diabetes mellitus without complication ([place])   ? diet controlled, type 2  ? Diverticulosis   ? GERD (gastroesophageal reflux disease)   ? without esophagitis  ? HOH (hard of hearing)   ?  aids  ? Hypertension   ? controlled on meds  ? Osteoporosis   ? Pure hypercholesterolemia   ? Seasonal allergies   ? ?Past Surgical History:  ?Procedure Laterality Date  ? BREAST CYST ASPIRATION Left 1992  ? benign  ? BREAST EXCISIONAL BIOPSY Right 2000  ? benign  ? CARDIOVERSION N/A 03/22/2021  ? Procedure: CARDIOVERSION;  Surgeon: Corey Skains, MD;  Location: [facility] ORS;  Service: Cardiovascular;  Laterality: N/A;  ? CATARACT EXTRACTION W/PHACO Left 02/21/2015  ? Procedure: CATARACT EXTRACTION PHACO AND INTRAOCULAR LENS PLACEMENT (IOC);  Surgeon: Estill Cotta, MD;  Location:  [facility] ORS;  Service: Ophthalmology;  Laterality: Left;   00:59 ?AP% 25.2 ?CDE 24.66  ? CATARACT EXTRACTION W/PHACO Right 02/26/2018  ? Procedure: CATARACT EXTRACTION PHACO AND INTRAOCULAR LENS PLACEMENT ([place])  DIABETIC RIGHT;  Surgeon: Leandrew Koyanagi, MD;  Location: [place];  Service: Ophthalmology;  Laterality: Right;  Diabetes-diet controlled  ? COLONOSCOPY WITH PROPOFOL N/A 12/22/2015  ? Procedure: COLONOSCOPY WITH PROPOFOL;  Surgeon: Manya Silvas, MD;  Location: [facility] ENDOSCOPY;  Service: Endoscopy;  Laterality: N/A;  ? DILATION AND CURETTAGE OF UTERUS    ? ?Social History:  reports that she has quit smoking. She has never used smokeless tobacco. She reports that she does not drink alcohol and does not use drugs. ? ?No Known Allergies ? ?Family History  ?Problem Relation Age of Onset  ? Breast cancer Maternal Aunt 60  ? ? ?Prior to Admission medications   ?Medication Sig Start Date End Date Taking? Authorizing Provider  ?alendronate (FOSAMAX) 70 MG tablet Take 70 mg by mouth once a week. Take with a full glass of water on an empty stomach.    [provider]  ?amiodarone (PACERONE) 200 MG tablet Take 200 mg by mouth 2 (two) times daily. 03/07/21 03/07/22  [provider]  ?amLODipine (NORVASC) 10 MG tablet Take 10 mg by mouth daily.    [provider]  ?apixaban (ELIQUIS) 5 MG TABS tablet Take 5 mg by mouth 2 (two) times daily. 03/01/21   [provider]  ?Calcium Carb-Cholecalciferol 600-400 MG-UNIT TABS Take 1 tablet by mouth 2 (two) times daily.    [provider]  ?Cinnamon 500 MG TABS Take 500 mg by mouth 2 (two) times daily.    [provider]  ?enalapril (VASOTEC) 20 MG tablet Take 20 mg by mouth daily.    [provider]  ?hydrochlorothiazide (HYDRODIURIL) 25 MG tablet Take 25 mg by mouth daily. am    [provider]  ?lovastatin (MEVACOR) 40 MG tablet Take 40 mg by mouth at bedtime.    [provider]   ?metoprolol succinate (TOPROL-XL) 25 MG 24 hr tablet Take 25 mg by mouth 2 (two) times daily. Take with or immediately following a meal.    [provider]  ?pantoprazole (PROTONIX) 20 MG tablet Take 20 mg by mouth daily. dinner    [provider]  ?potassium chloride (K-DUR,KLOR-CON) 10 MEQ tablet Take 20 mEq by mouth 2 (two) times daily.     [provider]  ?vitamin B-12 (CYANOCOBALAMIN) 500 MCG tablet Take 500 mcg by mouth daily.    [provider]  ?vitamin C (ASCORBIC ACID) 500 MG tablet Take 500 mg by mouth 2 (two) times daily.     [provider]  ?vitamin E 400 UNIT capsule Take 400 Units by mouth daily.    [provider]  ? ? ?Physical Exam: ?Vitals:  ? 01/19/22 1045 01/19/22  1230 01/19/22 1300 01/19/22 1330  ?BP:  (!) 154/77 (!) 178/75 (!) 147/77  ?Pulse: 65 65 61 86  ?Resp: '13 12 15 19  '$ ?Temp:      ?TempSrc:      ?SpO2: 98% 99% 100% 100%  ?Weight:      ?Height:      ? ?Physical Exam ?Vitals and nursing note reviewed.  ?Constitutional:   ?   General: She is not in acute distress. ?   Appearance: She is not ill-appearing.  ?HENT:  ?   Mouth/Throat:  ?   Mouth: Mucous membranes are moist.  ?Eyes:  ?   General: No scleral icterus. ?Cardiovascular:  ?   Rate and Rhythm: Normal rate. Rhythm irregular.  ?   Heart sounds: Normal heart sounds. No murmur heard. ?Pulmonary:  ?   Effort: Pulmonary effort is normal. No respiratory distress.  ?   Breath sounds: Normal breath sounds. No wheezing or rales.  ?Abdominal:  ?   General: Abdomen is flat. There is no distension.  ?   Palpations: Abdomen is soft. There is no mass.  ?   Tenderness: There is no abdominal tenderness.  ?Musculoskeletal:  ?   Comments: Large firm echymosis overlying the R gluteus  ?Skin: ?   General: Skin is warm and dry.  ?Neurological:  ?   Mental Status: She is alert. She is disoriented.  ? ? ?Data Reviewed: ? ?ECG:  Afib, inferolateral T wave flattening which is new compared to prior,  however there are multiple ECG's in Care Everywhere that note similar abnormalities in the read (but I am unable to view) so suspect this is not new. Patient also had NM perfusion scan 04/2021 that was normal.  ?

## 2022-01-19 NOTE — Assessment & Plan Note (Addendum)
Significant fall leading to significant hematoma.  Per discussion with son normally has good mobility but is high risk for deconditioning. ?- PT/OT consult ?- TOC consult for placement ?

## 2022-01-19 NOTE — Evaluation (Signed)
Physical Therapy Evaluation ?Patient Details ?Name: Terry Kaiser ?MRN: [id number] ?DOB: 04-24-1936 ?Today's Date: 01/19/2022 ? ?History of Present Illness ? Pt is an 86 y.o. female presenting to hospital 4/14 s/p fall.  Worsening dementia/progressive decline noted (pt has been having memory issues and wording finding difficulty).  Imaging showing "severe hemorrhagic soft tissue contusion involving the subcutaneous fat overlying the right hip with a 8.2 x 3.4 x 8.7 cm hematoma."  Pt admitted with R hip contusion s/p fall (significant hematoma), worsening dementia, and hypokalemia.  PMH includes emphysema, mild aortic regurgitation, MV regurgitation, persistent a-fib on Eliquis, DM, htn, chronic LE edema, diverticulosis, chronic anemia.  ?Clinical Impression ? Prior to hospital admission, pt was ambulatory with [facility]; lives at [facility].  Pt's cognition has been gradually declining last several months and requiring more assistance from family for IADL's.  PT/OT co-evaluation performed for OOB mobility.  Currently pt is min assist x2 for bed mobility and CGA to min assist x2 for transfers and taking steps a couple feet to L along bed with RW use.  Limited activity d/t pt reporting dizziness and shakiness with upright activity. Orthostatic BP taken during session but unsure if accurate d/t pt moving UE during BP measurements (supine BP 178/83 and 169/78 when taken again d/t pt moving her UE; sitting BP 144/78; and standing BP 125/62).  Pt would benefit from skilled PT to address noted impairments and functional limitations (see below for any additional details).  Upon hospital discharge, pt would benefit from SNF.   ? ?Recommendations for follow up therapy are one component of a multi-disciplinary discharge planning process, led by the attending physician.  Recommendations may be updated based on patient status, additional functional criteria and insurance authorization. ? ?Follow Up Recommendations Skilled  nursing-short term rehab (<3 hours/day) ? ?  ?Assistance Recommended at Discharge Frequent or constant Supervision/Assistance  ?Patient can return home with the following ? A lot of help with walking and/or transfers;A lot of help with bathing/dressing/bathroom;Assistance with cooking/housework;Direct supervision/assist for medications management;Assist for transportation ? ?  ?Equipment Recommendations Rolling walker (2 wheels);BSC/3in1  ?Recommendations for Other Services ? OT consult  ?  ?Functional Status Assessment Patient has had a recent decline in their functional status and demonstrates the ability to make significant improvements in function in a reasonable and predictable amount of time.  ? ?  ?Precautions / Restrictions Precautions ?Precautions: Fall ?Restrictions ?Weight Bearing Restrictions: No  ? ?  ? ?Mobility ? Bed Mobility ?Overal bed mobility: Needs Assistance ?Bed Mobility: Supine to Sit, Sit to Supine ?  ?  ?Supine to sit: Min assist, +2 for physical assistance, +2 for safety/equipment ?Sit to supine: Min assist, +2 for physical assistance, +2 for safety/equipment ?  ?General bed mobility comments: vc's for technique; assist for R LE and trunk ?  ? ?Transfers ?Overall transfer level: Needs assistance ?Equipment used: Rolling walker (2 wheels) ?Transfers: Sit to/from Stand ?Sit to Stand: Min guard, +2 physical assistance, +2 safety/equipment ?  ?  ?  ?  ?  ?General transfer comment: vc's for UE placement ?  ? ?Ambulation/Gait ?Ambulation/Gait assistance: Min guard, Min assist, +2 physical assistance, +2 safety/equipment ?Gait Distance (Feet):  (sidestepping a couple feet towards L along bed) ?Assistive device: Rolling walker (2 wheels) ?  ?Gait velocity: decreased ?  ?  ?General Gait Details: mild decreased stance time R LE ? ?Stairs ?  ?  ?  ?  ?  ? ?Wheelchair Mobility ?  ? ?Modified Rankin (Stroke Patients Only) ?  ? ?  ? ?  Balance Overall balance assessment: Needs assistance ?Sitting-balance  support: No upper extremity supported, Feet supported ?Sitting balance-Leahy Scale: Good ?Sitting balance - Comments: steady sitting reaching within BOS ?  ?Standing balance support: Reliant on assistive device for balance, Bilateral upper extremity supported ?Standing balance-Leahy Scale: Fair ?Standing balance comment: steady static standing with B UE support on RW ?  ?  ?  ?  ?  ?  ?  ?  ?  ?  ?  ?   ? ? ? ?Pertinent Vitals/Pain Pain Assessment ?Pain Assessment: Faces ?Pain Location: R hip with mobiltiy ?Pain Descriptors / Indicators: Guarding, Grimacing, Sore ?Pain Intervention(s): Limited activity within patient's tolerance, Monitored during session, Repositioned  ? ? ?Home Living Family/patient expects to be discharged to:: Assisted living ?  ?  ?  ?  ?  ?  ?  ?  ?Home Equipment: Shower seat;Grab bars - tub/shower;Standard Walker;Cane - single point ?Additional Comments: [place]  ?  ?Prior Function Prior Level of Function : History of Falls (last six months);Independent/Modified Independent ?  ?  ?  ?  ?  ?  ?Mobility Comments: Per family, pt ambulatory with SPC at baseline (but often carries SPC instead of uses it); 1 other fall in last 6 months. ?ADLs Comments: Per OT eval "Pt is generally active/independent. Has been needing more assist for ADL/IADL management lately 2/2 cognition. Son visits frequently and provides assistance with IADL management including mediction management, shopping, etc. Pt gets her meals from [facility] facility 3x/day. Has microwave in her apartment that son states she is no longer able to use 2/2 cognition." ?  ? ? ?Hand Dominance  ? Dominant Hand: Right ? ?  ?Extremity/Trunk Assessment  ? Upper Extremity Assessment ?Upper Extremity Assessment: Generalized weakness ?  ? ?Lower Extremity Assessment ?Lower Extremity Assessment: Generalized weakness ?  ? ?Cervical / Trunk Assessment ?Cervical / Trunk Assessment: Normal  ?Communication  ? Communication:  Expressive difficulties  ?Cognition Arousal/Alertness: Awake/alert ?Behavior During Therapy: [facility] for tasks assessed/performed ?Overall Cognitive Status: History of cognitive impairments - at baseline ?  ?  ?  ?  ?  ?  ?  ?  ?  ?  ?  ?  ?  ?  ?  ?  ?General Comments: Per OT eval "Per family, pt cognition has been declining over the last month or so. She has required more assistance with basic tasks 2/2 this. She has difficulty with word finding and expressive language at baseline that has also increased in the last month." ?  ?  ? ?  ?General Comments  ? ?  ?Exercises    ? ?Assessment/Plan  ?  ?PT Assessment Patient needs continued PT services  ?PT Problem List Decreased strength;Decreased activity tolerance;Decreased balance;Decreased mobility;Decreased knowledge of use of DME;Decreased knowledge of precautions;Pain ? ?   ?  ?PT Treatment Interventions DME instruction;Gait training;Functional mobility training;Therapeutic activities;Therapeutic exercise;Balance training;Patient/family education   ? ?PT Goals (Current goals can be found in the Care Plan section)  ?Acute Rehab PT Goals ?Patient Stated Goal: to improve mobility ?PT Goal Formulation: With patient/family ?Time For Goal Achievement: 02/02/22 ?Potential to Achieve Goals: Fair ? ?  ?Frequency Min 2X/week ?  ? ? ?Co-evaluation PT/OT/SLP Co-Evaluation/Treatment: Yes ?Reason for Co-Treatment: For patient/therapist safety;To address functional/ADL transfers ?PT goals addressed during session: Mobility/safety with mobility ?OT goals addressed during session: ADL's and self-care ?  ? ? ?  ?AM-PAC PT "6 Clicks" Mobility  ?Outcome Measure Help needed turning from your back  to your side while in a flat bed without using bedrails?: A Little ?Help needed moving from lying on your back to sitting on the side of a flat bed without using bedrails?: A Lot ?Help needed moving to and from a bed to a chair (including a wheelchair)?: A Lot ?Help needed standing up from a  chair using your arms (e.g., wheelchair or bedside chair)?: A Lot ?Help needed to walk in hospital room?: A Lot ?Help needed climbing 3-5 steps with a railing? : Total ?6 Click Score: 12 ? ?  ?End of Session Equipment Utilize

## 2022-01-19 NOTE — Assessment & Plan Note (Addendum)
Chronic with unclear etiology. ?- Check iron studies ?- Trend CBC ?- Continue home B12 supplements ?- Consider outpatient colonoscopy ?

## 2022-01-20 ENCOUNTER — Encounter: Payer: Self-pay | Admitting: Family Medicine

## 2022-01-20 ENCOUNTER — Inpatient Hospital Stay: Payer: Medicare HMO

## 2022-01-20 DIAGNOSIS — R58 Hemorrhage, not elsewhere classified: Secondary | ICD-10-CM | POA: Diagnosis not present

## 2022-01-20 DIAGNOSIS — F0390 Unspecified dementia without behavioral disturbance: Secondary | ICD-10-CM | POA: Diagnosis not present

## 2022-01-20 DIAGNOSIS — R600 Localized edema: Secondary | ICD-10-CM | POA: Diagnosis not present

## 2022-01-20 DIAGNOSIS — Z66 Do not resuscitate: Secondary | ICD-10-CM | POA: Diagnosis not present

## 2022-01-20 LAB — CBC
HCT: 25.5 % — ABNORMAL LOW (ref 36.0–46.0)
Hemoglobin: 8.4 g/dL — ABNORMAL LOW (ref 12.0–15.0)
MCH: 29.9 pg (ref 26.0–34.0)
MCHC: 32.9 g/dL (ref 30.0–36.0)
MCV: 90.7 fL (ref 80.0–100.0)
Platelets: 134 10*3/uL — ABNORMAL LOW (ref 150–400)
RBC: 2.81 MIL/uL — ABNORMAL LOW (ref 3.87–5.11)
RDW: 13.1 % (ref 11.5–15.5)
WBC: 7.7 10*3/uL (ref 4.0–10.5)
nRBC: 0 % (ref 0.0–0.2)

## 2022-01-20 LAB — GLUCOSE, CAPILLARY
Glucose-Capillary: 134 mg/dL — ABNORMAL HIGH (ref 70–99)
Glucose-Capillary: 139 mg/dL — ABNORMAL HIGH (ref 70–99)
Glucose-Capillary: 181 mg/dL — ABNORMAL HIGH (ref 70–99)
Glucose-Capillary: 124 mg/dL — ABNORMAL HIGH (ref 70–99)
Glucose-Capillary: 140 mg/dL — ABNORMAL HIGH (ref 70–99)

## 2022-01-20 LAB — RPR: RPR Ser Ql: NONREACTIVE

## 2022-01-20 LAB — POTASSIUM: Potassium: 3.1 mmol/L — ABNORMAL LOW (ref 3.5–5.1)

## 2022-01-20 IMAGING — MR MR HEAD W/O CM
11 series · 48 of 48 positions shown · non-contrast
Comparison: CT head without contrast [DATE]

CLINICAL DATA: Mental status change, unknown cause. Frequent falls
with progressive dementia.

EXAM:
MRI HEAD WITHOUT CONTRAST
TECHNIQUE: Multiplanar, multiecho pulse sequences of the brain and surrounding
structures were obtained without intravenous contrast.

[Series 5: ax dwi_tracew · axial · 3.0mm · 0.71mm/px · z∈[-101,+62]mm · 6 of 55 slices shown]
[im 1/55]
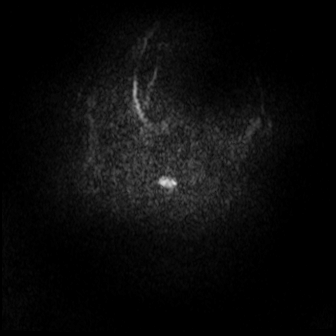
[im 11/55]
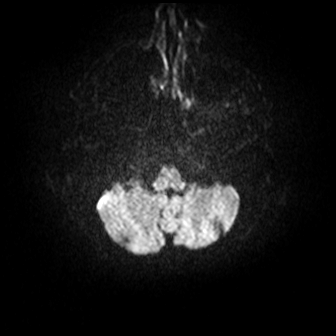
[im 22/55]
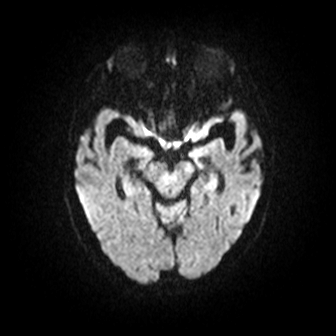
[im 33/55]
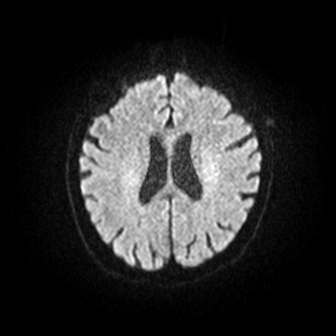
[im 44/55]
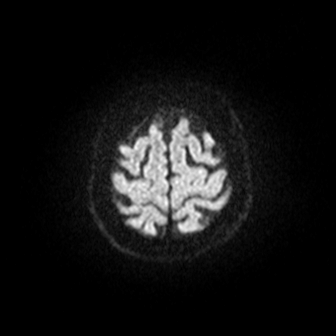
[im 55/55]
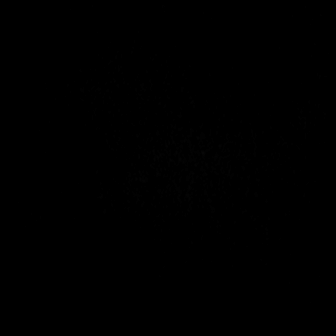

[Series 6: ax dwi_adc · axial · 3.0mm · 0.71mm/px · z∈[-101,+56]mm · 5 of 54 slices shown]
[im 1/54]
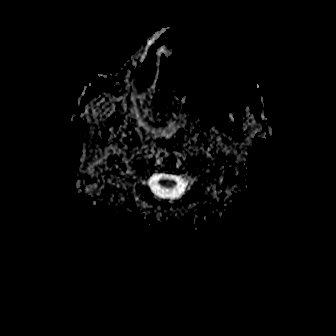
[im 14/54]
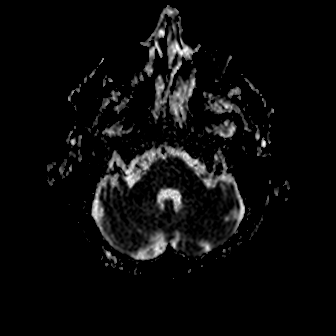
[im 27/54]
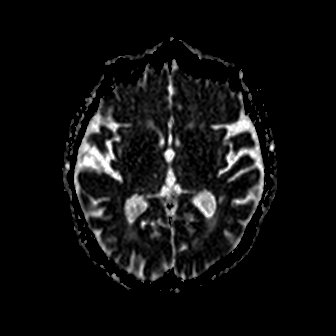
[im 40/54]
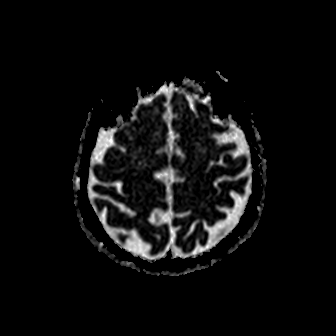
[im 54/54]
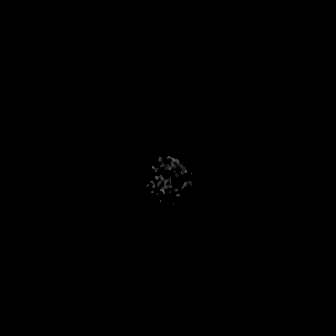

[Series 7: cor dwi_tracew · coronal · 5.0mm · 0.68mm/px · 3 of 35 slices shown]
[im 1/35]
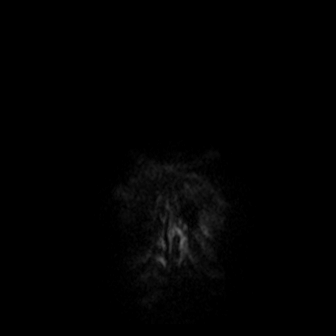
[im 18/35]
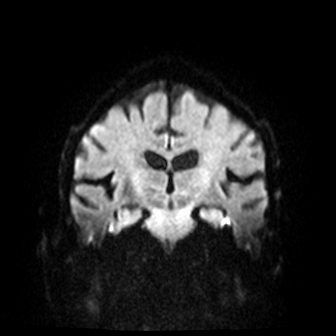
[im 35/35]
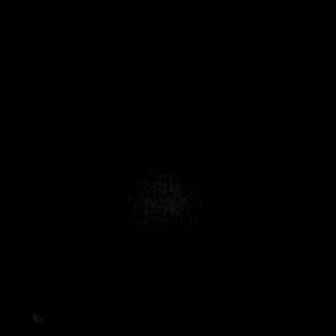

[Series 8: cor dwi_adc · coronal · 5.0mm · 0.68mm/px · 3 of 35 slices shown]
[im 1/35]
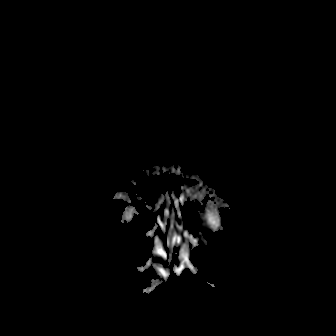
[im 18/35]
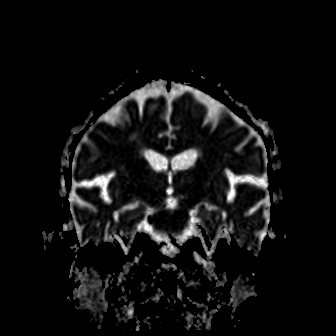
[im 35/35]
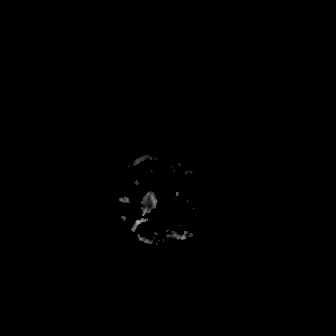

[Series 9: T1 · sagittal · 5.0mm · 0.47mm/px · 2 of 22 slices shown (1 of 2)]
[im 1/22]
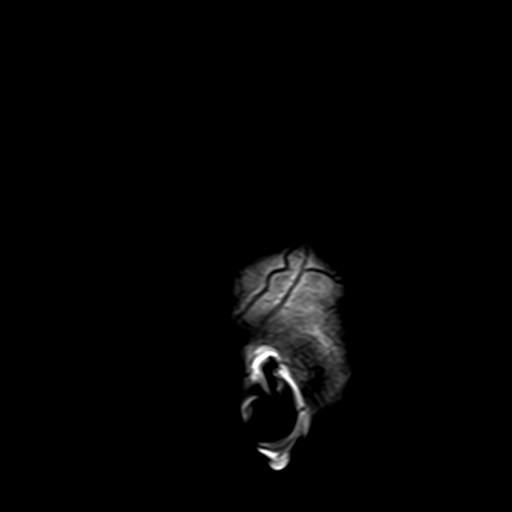
[im 22/22]
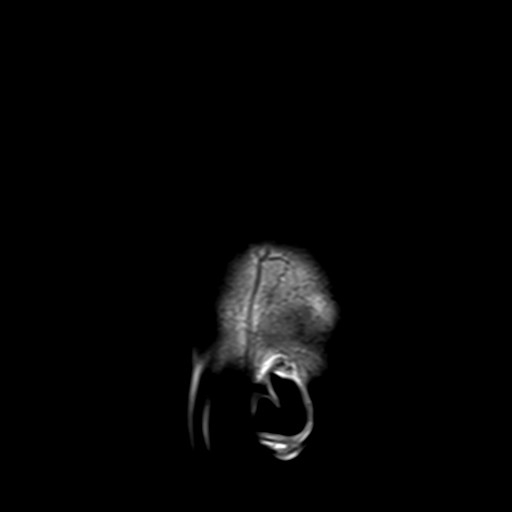

[Series 10: T2 · axial · 5.0mm · 0.86mm/px · z∈[-97,+57]mm · 2 of 27 slices shown (1 of 2)]
[im 1/27]
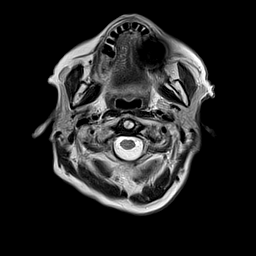
[im 27/27]
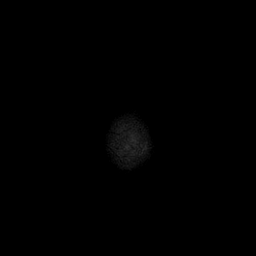

[Series 12: ax swi_pha · axial · 3.0mm · 0.90mm/px · z∈[-95,+56]mm · 4 of 50 slices shown]
[im 1/50]
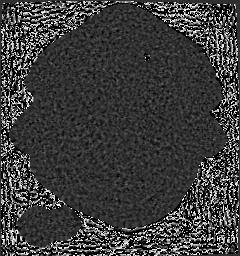
[im 17/50]
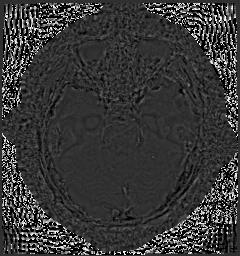
[im 33/50]
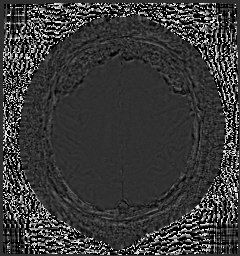
[im 50/50]
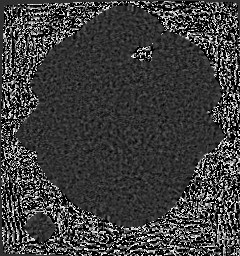

[Series 13: ax swi_swi · axial · 3.0mm · 0.90mm/px · z∈[-95,+56]mm · 4 of 52 slices shown]
[im 1/52]
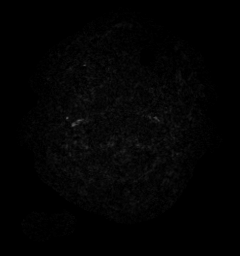
[im 18/52]
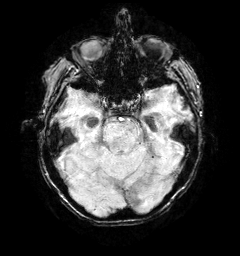
[im 35/52]
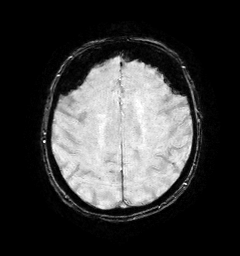
[im 52/52]
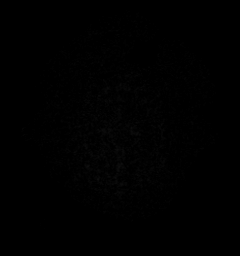

[Series 15: FLAIR · axial · 3.0mm · 0.69mm/px · z∈[-95,+56]mm · 4 of 52 slices shown]
[im 1/52]
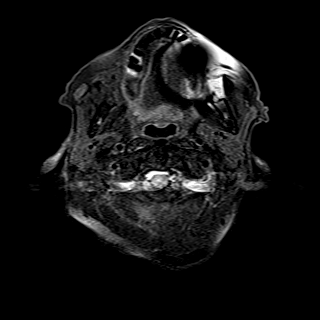
[im 18/52]
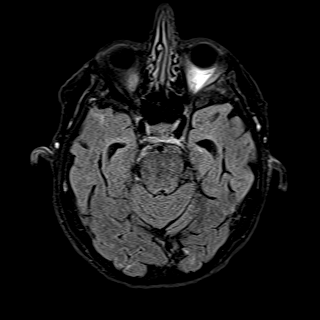
[im 35/52]
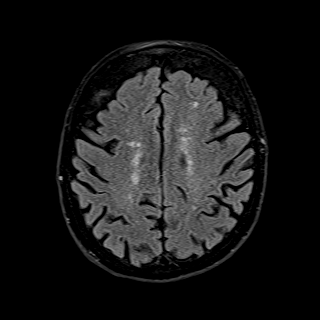
[im 52/52]
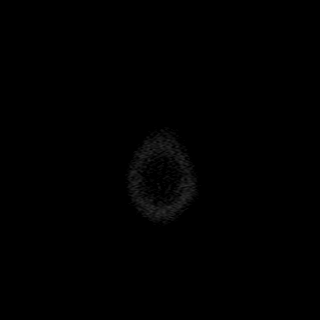

[Series 16: T1 · axial · 1.0mm · 0.98mm/px · z∈[-99,+58]mm · 13 of 157 slices shown (2 of 2)]
[im 1/157]
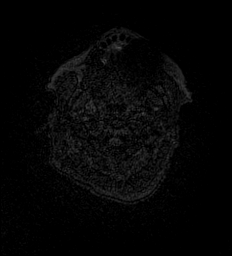
[im 14/157]
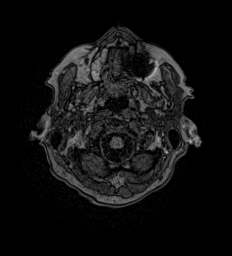
[im 27/157]
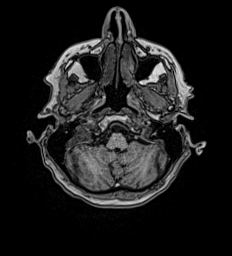
[im 40/157]
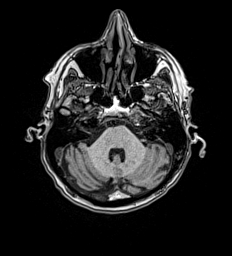
[im 53/157]
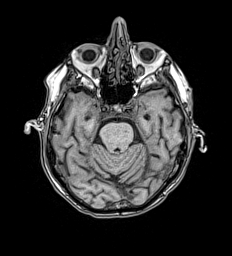
[im 66/157]
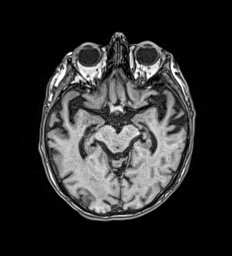
[im 79/157]
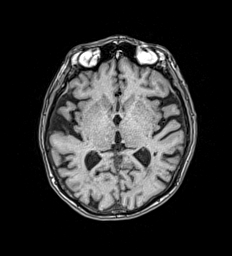
[im 92/157]
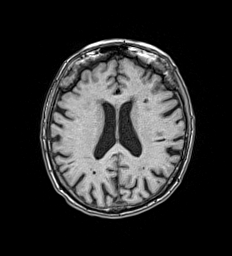
[im 105/157]
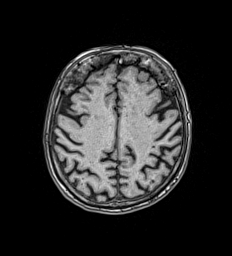
[im 118/157]
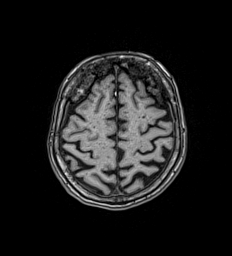
[im 131/157]
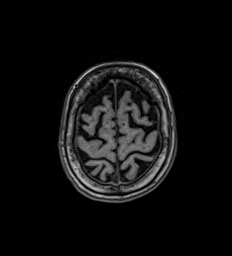
[im 144/157]
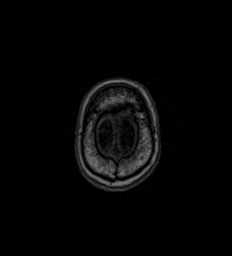
[im 157/157]
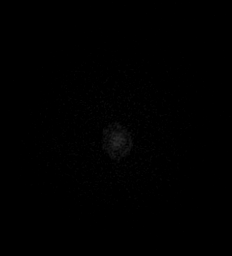

[Series 17: T2 · coronal · 5.0mm · 0.86mm/px · 2 of 28 slices shown (2 of 2)]
[im 1/28]
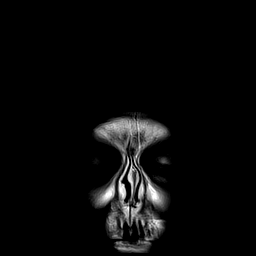
[im 28/28]
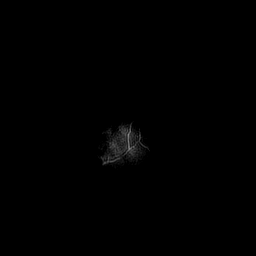

[48 of 48 positions shown; findings below may reference images not displayed]

FINDINGS: Brain: No acute infarct, hemorrhage, or mass lesion is present.
Moderate generalized atrophy is present. Periventricular and
scattered subcortical T2 hyperintensities bilaterally are mildly
advanced for age. Remote lacunar infarcts are present the cerebellum
bilaterally. Remote lacunar infarcts are also present in the thalami
bilaterally.

Focal susceptibility present in the right thalamus. Scattered areas
of punctate susceptibility are scattered throughout the cerebellum
bilaterally

The ventricles are of normal size. No significant extraaxial fluid
collection is present. The internal auditory canals are within
normal limits.

Vascular: Flow is present in the major intracranial arteries.

Skull and upper cervical spine: The craniocervical junction is
normal. Upper cervical spine is within normal limits. Marrow signal
is unremarkable.

Sinuses/Orbits: The paranasal sinuses and mastoid air cells are
clear. Bilateral lens replacements are noted. Globes and orbits are
otherwise unremarkable.
IMPRESSION: 1. No acute intracranial abnormality.
2. Moderate generalized atrophy and white matter disease likely
reflects the sequela of chronic microvascular ischemia.
3. Remote lacunar infarcts of the cerebellum bilaterally and thalami
bilaterally.
4. Scattered areas of punctate susceptibility scattered throughout
the cerebellum bilaterally. This may be related to amyloid
angiopathy.

## 2022-01-20 NOTE — Progress Notes (Addendum)
Patient was assisted to the restroom by Nurse Tech. Patient ambulated to [facility] and was instructed to sit on the toilet, before the patient could sit on the toilet, she had an episode of syncope. Patient was guided to the bathroom floor by nurse tech and RRT was called. Patient quickly began to arouse and became Alert and oriented x4. Her B/P was 87/48 , when she was returned back to the bed her B/P was 124/52 MAP of 69 and BG was 139. Patient expressed she did not hit her head and she just "got a little dizzy". Patient was returned to the bed via wheelchair and x2 assist with no complications. When patient was attempting to ambulate her B/P was 87/48 , when she was returned back to the bed her B/P was 124/52 MAP of 69 and BG was 139. Patient expressed no concerns or new pain . Skin intact with no concerns. Kurtis Bushman, MD notified and responded. High fall risk bundle was in place at time of fall. New orders placed and completed. Patient is stable and VSS. Appropriate mediations administered. Family was notified. Will continue to monitor. ?

## 2022-01-20 NOTE — Plan of Care (Signed)
  Problem: Education: Goal: Knowledge of General Education information will improve Description Including pain rating scale, medication(s)/side effects and non-pharmacologic comfort measures Outcome: Progressing   

## 2022-01-20 NOTE — TOC Initial Note (Signed)
Transition of Care (TOC) - Initial/Assessment Note  ? ? ?Patient Details  ?Name: Terry Kaiser ?MRN: [id number] ?Date of Birth: 1936/08/07 ? ?Transition of Care (TOC) CM/SW Contact:    ?Andrick Rust E Lessa Huge, LCSW ?Phone Number: ?01/20/2022, 12:27 PM ? ?Clinical Narrative:                CSW spoke with patient's son Dellis Filbert regarding SNF recommendation. ?Patient lives at [facility]. She uses a cane at baseline and son takes her to appointments. PCP is Dr. Netty Starring. Patient has HHPT and OT but son was not sure of agency used. ?Son is agreeable to SNF recommendation. Sent Medicare Care Compare link to him to review for preferences. Starting SNF work up.  ? ? ?Expected Discharge Plan: [place] ?Barriers to Discharge: Continued Medical Work up ? ? ?Patient Goals and CMS Choice ?Patient states their goals for this hospitalization and ongoing recovery are:: SNF ?CMS Medicare.gov Compare Post Acute Care list provided to:: Patient Represenative (must comment) ?Choice offered to / list presented to : Adult Children ? ?Expected Discharge Plan and Services ?Expected Discharge Plan: [place] ?  ?  ?  ?Living arrangements for the past 2 months: [place] ?                ?  ?  ?  ?  ?  ?  ?  ?  ?  ?  ? ?Prior Living Arrangements/Services ?Living arrangements for the past 2 months: [place] ?Lives with:: Self ?Patient language and need for interpreter reviewed:: Yes ?Do you feel safe going back to the place where you live?: No   needs SNF for rehab  ?Need for Family Participation in Patient Care: Yes (Comment) ?Care giver support system in place?: Yes (comment) ?Current home services: DME, Home PT, Home OT ?Criminal Activity/Legal Involvement Pertinent to Current Situation/Hospitalization: No - Comment as needed ? ?Activities of Daily Living ?Home Assistive Devices/Equipment: Eyeglasses, Hearing aid, Cane (specify quad or straight) ?ADL Screening (condition at time  of admission) ?Patient's cognitive ability adequate to safely complete daily activities?: Yes ?Is the patient deaf or have difficulty hearing?: Yes ?Does the patient have difficulty seeing, even when wearing glasses/contacts?: No ?Does the patient have difficulty concentrating, remembering, or making decisions?: No ?Patient able to express need for assistance with ADLs?: Yes ?Does the patient have difficulty dressing or bathing?: No ?Independently performs ADLs?: Yes (appropriate for developmental age) ?Does the patient have difficulty walking or climbing stairs?: No ?Weakness of Legs: None ?Weakness of Arms/Hands: None ? ?Permission Sought/Granted ?Permission sought to share information with : Customer service manager ?Permission granted to share information with : Yes, Verbal Permission Granted (by son) ?   ? Permission granted to share info w AGENCY: SNFs ?   ?   ? ?Emotional Assessment ?  ?  ?  ?Orientation: : Fluctuating Orientation (Suspected and/or reported Sundowners) ?Alcohol / Substance Use: Not Applicable ?Psych Involvement: No (comment) ? ?Admission diagnosis:  Fall [W19.XXXA] ?Patient Active Problem List  ? Diagnosis Date Noted  ? GERD without esophagitis 01/19/2022  ? Diverticulosis 01/19/2022  ? Type 2 diabetes mellitus with hyperlipidemia ([place]) 01/19/2022  ? Fall 01/19/2022  ? Known medical problems 01/19/2022  ? Dementia without behavioral disturbance ([place]) 01/19/2022  ? Hypokalemia 01/19/2022  ? Ecchymosis 01/19/2022  ? GAD (generalized anxiety disorder) 01/15/2022  ? Persistent atrial fibrillation ([employer]) 09/25/2021  ? Bilateral leg edema 05/22/2021  ? Mild aortic regurgitation 01/31/2021  ? Nonrheumatic mitral  valve regurgitation 01/31/2021  ? Emphysema of lung ([place]) 12/05/2020  ? DNR (do not resuscitate) 07/01/2018  ? History of normocytic normochromic anemia 07/01/2018  ? Essential hypertension 07/01/2014  ? ?PCP:  Dion Body, MD ?Pharmacy:   ?[id number] [id number]?Phone: [phone]: [phone]? ?[facility] [place]?Phone: [phone]: [phone]? ? ? ? ?Social Determinants of Health (SDOH) Interventions ?  ? ?Readmission Risk Interventions ? ?  01/20/2022  ? 12:26 PM  ?Readmission Risk Prevention Plan  ?Transportation Screening Complete  ?PCP or Specialist Appt within 5-7 Days Complete  ?Home Care Screening Complete  ?Medication Review (RN CM) Complete  ? ? ? ?

## 2022-01-20 NOTE — Progress Notes (Signed)
?PROGRESS NOTE ? ? ? ?Terry Kaiser  TFT:[id number] DOB: Feb 27, 1936 DOA: 01/19/2022 ?PCP: Dion Body, MD  ? ? ?Brief Narrative:  ?Terry Kaiser is a 86 y.o. female with medical history significant for emphysema, mild aortic regurgitation, mitral valve regurgitation, persistent A-fib on Eliquis, type 2 diabetes controlled with diet, hypertension, chronic lower extremity edema, diverticulosis, chronic anemia, who presents from home after a fall. ? ?4/15 son at bedside. Pt fell this am while going to bathroom. Was told she was "outt" for a few seconds. Bp taken with sbp 80's. Repeat bp improved to 100's. Pt did not hit her head. Per son, pt has trouble findings words started few months ago.  ? ?Consultants:  ? ? ?Procedures:  ?Head CT ?No acute intracranial abnormality. ? ?Cervical spine CT ?No acute fracture or dislocation of the cervical or visualized upper ?thoracic spine ? ? ?MRI of right hip ?1. No hip fracture, dislocation or avascular necrosis. ?2. Severe hemorrhagic soft tissue contusion involving the ?subcutaneous fat overlying the right hip with a 8.2 x 3.4 x 8.7 cm ?hematoma. ? ?Antimicrobials:  ?  ? ? ?Subjective: ?She denies sob, cp, abd pain. Points to her right hip. ? ?Objective: ?Vitals:  ? 01/20/22 0444 01/20/22 0725 01/20/22 0739 01/20/22 0945  ?BP: (!) 115/59 (!) 87/48 (!) 124/52 137/69  ?Pulse: 74  77   ?Resp: 18  18   ?Temp: 98.6 ?F (37 ?C)  98.2 ?F (36.8 ?C)   ?TempSrc:   Oral   ?SpO2: 98%  99%   ?Weight:      ?Height:      ? ? ?Intake/Output Summary (Last 24 hours) at 01/20/2022 1457 ?Last data filed at 01/20/2022 0900 ?Gross per 24 hour  ?Intake 60 ml  ?Output --  ?Net 60 ml  ? ?Filed Weights  ? 01/19/22 0926  ?Weight: 77.1 kg  ? ? ?Examination: ?Calm, NAD ?Cta no w/r ?Reg s1/s2 no gallop ?Soft benign +bs ?No edema ?Grossly intact. Forgetful. Trouble findings words. Awake oriented to place date and place.  ?Mood and affect appropriate in current setting  ? ? ? ?Data Reviewed: I have  personally reviewed following labs and imaging studies ? ?CBC: ?Recent Labs  ?Lab 01/19/22 ?0929 01/19/22 ?1701 01/20/22 ?7062  ?WBC 8.9 9.0 7.7  ?NEUTROABS 7.8*  --   --   ?HGB 9.3*  9.6* 9.1* 8.4*  ?HCT 28.7*  30.0* 28.4* 25.5*  ?MCV 92.6 92.2 90.7  ?PLT 165 161 134*  ? ?Basic Metabolic Panel: ?Recent Labs  ?Lab 01/19/22 ?0929 01/20/22 ?0927  ?NA 140  --   ?K 3.1* 3.1*  ?CL 104  --   ?CO2 27  --   ?GLUCOSE 204*  --   ?BUN 31*  --   ?CREATININE 0.86  --   ?CALCIUM 8.7*  --   ? ?GFR: ?Estimated Creatinine Clearance: 52 mL/min (by C-G formula based on SCr of 0.86 mg/dL). ?Liver Function Tests: ?Recent Labs  ?Lab 01/19/22 ?0929  ?AST 26  ?ALT 17  ?ALKPHOS 42  ?BILITOT 1.1  ?PROT 6.3*  ?ALBUMIN 3.5  ? ?No results for input(s): LIPASE, AMYLASE in the last 168 hours. ?No results for input(s): AMMONIA in the last 168 hours. ?Coagulation Profile: ?Recent Labs  ?Lab 01/19/22 ?1701  ?INR 1.2  ? ?Cardiac Enzymes: ?Recent Labs  ?Lab 01/19/22 ?0929  ?CKTOTAL 205  ? ?BNP (last 3 results) ?No results for input(s): PROBNP in the last 8760 hours. ?HbA1C: ?No results for input(s): HGBA1C in the last 72  hours. ?CBG: ?Recent Labs  ?Lab 01/19/22 ?1647 01/19/22 ?2020 01/20/22 ?0741 01/20/22 ?0750 01/20/22 ?1135  ?GLUCAP 108* 111* 134* 139* 181*  ? ?Lipid Profile: ?No results for input(s): CHOL, HDL, LDLCALC, TRIG, CHOLHDL, LDLDIRECT in the last 72 hours. ?Thyroid Function Tests: ?Recent Labs  ?  01/19/22 ?1701  ?TSH 0.604  ? ?Anemia Panel: ?Recent Labs  ?  01/19/22 ?1701  ?VITAMINB12 556  ?FERRITIN 125  ?TIBC 332  ?IRON 32  ? ?Sepsis Labs: ?No results for input(s): PROCALCITON, LATICACIDVEN in the last 168 hours. ? ?No results found for this or any previous visit (from the past 240 hour(s)).  ? ? ? ? ? ?Radiology Studies: ?CT Head Wo Contrast ? ?Result Date: 01/19/2022 ?CLINICAL DATA:  The fall Head trauma EXAM: CT HEAD WITHOUT CONTRAST TECHNIQUE: Contiguous axial images were obtained from the base of the skull through the vertex  without intravenous contrast. RADIATION DOSE REDUCTION: This exam was performed according to the departmental dose-optimization program which includes automated exposure control, adjustment of the mA and/or kV according to patient size and/or use of iterative reconstruction technique. COMPARISON:  None. FINDINGS: Brain: No evidence of acute infarction, hemorrhage, hydrocephalus, extra-axial collection or mass lesion/mass effect. Diffuse cortical atrophy. Periventricular white matter hypodensity is a nonspecific finding, but most commonly relates to chronic ischemic small vessel disease. Focal hypodensity in the left basal ganglia is likely a prominent space Virchow-Robin. Vascular: No hyperdense vessel or unexpected calcification. Skull: Normal. Negative for fracture or focal lesion. Sinuses/Orbits: No acute finding. Other: None. IMPRESSION: No acute intracranial abnormality. Electronically Signed   By: Miachel Roux M.D.   On: 01/19/2022 10:24  ? ?CT Cervical Spine Wo Contrast ? ?Result Date: 01/19/2022 ?CLINICAL DATA:  Fall EXAM: CT CERVICAL SPINE WITHOUT CONTRAST TECHNIQUE: Multidetector CT imaging of the cervical spine was performed without intravenous contrast. Multiplanar CT image reconstructions were also generated. RADIATION DOSE REDUCTION: This exam was performed according to the departmental dose-optimization program which includes automated exposure control, adjustment of the mA and/or kV according to patient size and/or use of iterative reconstruction technique. COMPARISON:  None. FINDINGS: Alignment: Within normal limits Skull base and vertebrae: No acute fracture. No primary bone lesion or focal pathologic process. Soft tissues and spinal canal: Extensive atherosclerotic calcifications of the carotid bulbs. 0.8 cm right thyroid nodule does not require further imaging follow-up. Disc levels: Degenerative changes seen throughout the cervical spine, greatest at C5-C6. Upper chest: Negative. Other: None.  IMPRESSION: No acute fracture or dislocation of the cervical or visualized upper thoracic spine. Electronically Signed   By: Miachel Roux M.D.   On: 01/19/2022 10:20  ? ?MR HIP RIGHT WO CONTRAST ? ?Result Date: 01/19/2022 ?CLINICAL DATA:  Status post fall.  Right hip pain. EXAM: MR OF THE RIGHT HIP WITHOUT CONTRAST TECHNIQUE: Multiplanar, multisequence MR imaging was performed. No intravenous contrast was administered. COMPARISON:  None. FINDINGS: Bones: No hip fracture, dislocation or avascular necrosis. No periosteal reaction or bone destruction. No aggressive osseous lesion. Normal sacrum and sacroiliac joints. No SI joint widening or erosive changes. Degenerative disease with disc height loss at L3-4, L4-5 and L5-S1. Articular cartilage and labrum Articular cartilage: Mild partial-thickness cartilage loss of the femoral head and acetabulum bilaterally. Labrum: Grossly intact, but evaluation is limited by lack of intraarticular fluid. Joint or bursal effusion Joint effusion:  No hip joint effusion.  No SI joint effusion. Bursae:  No bursa formation. Muscles and tendons Flexors: Normal. Extensors: Normal. Abductors: Normal. Adductors: Normal. Gluteals: Normal. Hamstrings: Normal. Other  findings No pelvic free fluid. No fluid collection or hematoma. No inguinal lymphadenopathy. No inguinal hernia. Severe soft tissue contusion involving the subcutaneous fat overlying the right hip. 8.2 x 3.4 x 8.7 cm hematoma in the subcutaneous fat overlying the right hip. IMPRESSION: 1. No hip fracture, dislocation or avascular necrosis. 2. Severe hemorrhagic soft tissue contusion involving the subcutaneous fat overlying the right hip with a 8.2 x 3.4 x 8.7 cm hematoma. Electronically Signed   By: Kathreen Devoid M.D.   On: 01/19/2022 11:25  ? ?DG Chest Portable 1 View ? ?Result Date: 01/19/2022 ?CLINICAL DATA:  AMS per ordering notes. Pt [facility] last night, was unable to get up until  this am when son came to visit. EXAM: PORTABLE CHEST - 1 VIEW COMPARISON:  CT 11/28/2020 FINDINGS: Lungs are clear. Heart size upper limits normal. Aortic Atherosclerosis (ICD10-170.0). No effusion.  No pneumothorax.

## 2022-01-20 NOTE — NC FL2 (Signed)
?[place] MEDICAID FL2 LEVEL OF CARE SCREENING TOOL  ?  ? ?IDENTIFICATION  ?Patient Name: ?Terry Kaiser Birthdate: Jul 19, 1936 Sex: female Admission Date (Current Location): ?01/19/2022  ?[place] and [place] Number: ? [place] ?  Facility and Address:  ?[facility], [address], [place], [place] [address] ?     Provider Number: ?[id number]  ?Attending Physician Name and Address:  ?Nolberto Hanlon, MD ? Relative Name and Phone Number:  ?Jerolene, [place])   [phone] [facility] Phone) ?   ?Current Level of Care: ?Hospital Recommended Level of Care: ?[place] Prior Approval Number: ?  ? ?Date Approved/Denied: ?  PASRR Number: ?[id number] A ? ?Discharge Plan: ?  ?  ? ?Current Diagnoses: ?Patient Active Problem List  ? Diagnosis Date Noted  ? GERD without esophagitis 01/19/2022  ? Diverticulosis 01/19/2022  ? Type 2 diabetes mellitus with hyperlipidemia ([place]) 01/19/2022  ? Fall 01/19/2022  ? Known medical problems 01/19/2022  ? Dementia without behavioral disturbance ([place]) 01/19/2022  ? Hypokalemia 01/19/2022  ? Ecchymosis 01/19/2022  ? GAD (generalized anxiety disorder) 01/15/2022  ? Persistent atrial fibrillation ([place]) 09/25/2021  ? Bilateral leg edema 05/22/2021  ? Mild aortic regurgitation 01/31/2021  ? Nonrheumatic mitral valve regurgitation 01/31/2021  ? Emphysema of lung ([place]) 12/05/2020  ? DNR (do not resuscitate) 07/01/2018  ? History of normocytic normochromic anemia 07/01/2018  ? Essential hypertension 07/01/2014  ? ? ?Orientation RESPIRATION BLADDER Height & Weight   ?  ?Self, Time, Place ? Normal External catheter Weight: 170 lb (77.1 kg) ?Height:  '5\' 6"'$  (167.6 cm)  ?BEHAVIORAL SYMPTOMS/MOOD NEUROLOGICAL BOWEL NUTRITION STATUS  ?    Continent Diet (heart healthy/carb modified)  ?AMBULATORY STATUS COMMUNICATION OF NEEDS Skin   ?Extensive Assist Verbally Bruising ?  ?  ?  ?    ?     ?     ? ? ?Personal Care Assistance Level of Assistance  ?Bathing, Feeding, Dressing  Bathing Assistance: Maximum assistance ?Feeding assistance: Limited assistance ?Dressing Assistance: Maximum assistance ?   ? ?Functional Limitations Info  ?    ?  ?   ? ? ?SPECIAL CARE FACTORS FREQUENCY  ?PT (By licensed PT), OT (By licensed OT)   ?  ?PT Frequency: 5 times per week ?OT Frequency: 5 times per week ?  ?  ?  ?   ? ? ?Contractures    ? ? ?Additional Factors Info  ?Code Status, Allergies Code Status Info: DNR ?Allergies Info: NKA ?  ?  ?  ?   ? ?Current Medications (01/20/2022):  This is the current hospital active medication list ?Current Facility-Administered Medications  ?Medication Dose Route Frequency Provider Last Rate Last Admin  ? acetaminophen (TYLENOL) tablet 650 mg  650 mg Oral Q6H PRN Clarnce Flock, MD      ? Or  ? acetaminophen (TYLENOL) suppository 650 mg  650 mg Rectal Q6H PRN Clarnce Flock, MD      ? albuterol (PROVENTIL) (2.5 MG/3ML) 0.083% nebulizer solution 2.5 mg  2.5 mg Nebulization Q4H PRN Clarnce Flock, MD      ? amiodarone (PACERONE) tablet 200 mg  200 mg Oral BID Clarnce Flock, MD   200 mg at 01/20/22 9485  ? amLODipine (NORVASC) tablet 10 mg  10 mg Oral Daily Clarnce Flock, MD   10 mg at 01/20/22 4627  ? enalapril (VASOTEC) tablet 20 mg  20 mg Oral Daily Clarnce Flock, MD   20 mg at 01/20/22 0350  ? escitalopram (  LEXAPRO) tablet 5 mg  5 mg Oral Daily Clarnce Flock, MD   5 mg at 01/20/22 0831  ? hydrochlorothiazide (HYDRODIURIL) tablet 25 mg  25 mg Oral Daily Clarnce Flock, MD   25 mg at 01/20/22 0831  ? insulin aspart (novoLOG) injection 0-6 Units  0-6 Units Subcutaneous TID WC Clarnce Flock, MD   1 Units at 01/20/22 1215  ? metoprolol succinate (TOPROL-XL) 24 hr tablet 25 mg  25 mg Oral BID Clarnce Flock, MD   25 mg at 01/20/22 0831  ? ondansetron (ZOFRAN) tablet 4 mg  4 mg Oral Q6H PRN Clarnce Flock, MD      ? Or  ? ondansetron [facility]) injection 4 mg  4 mg Intravenous Q6H PRN Clarnce Flock, MD      ? oxyCODONE (Oxy  IR/ROXICODONE) immediate release tablet 5 mg  5 mg Oral Q4H PRN Clarnce Flock, MD      ? pantoprazole (PROTONIX) EC tablet 20 mg  20 mg Oral Daily Clarnce Flock, MD   20 mg at 01/20/22 7169  ? polyethylene glycol (MIRALAX / GLYCOLAX) packet 17 g  17 g Oral Daily PRN Clarnce Flock, MD      ? potassium chloride SA (KLOR-CON M) CR tablet 20 mEq  20 mEq Oral BID Clarnce Flock, MD   20 mEq at 01/20/22 0831  ? pravastatin (PRAVACHOL) tablet 40 mg  40 mg Oral q1800 Clarnce Flock, MD   40 mg at 01/19/22 1833  ? sodium chloride flush (NS) 0.9 % injection 3 mL  3 mL Intravenous Q12H Clarnce Flock, MD   3 mL at 01/20/22 1008  ? traZODone (DESYREL) tablet 25 mg  25 mg Oral QHS PRN Clarnce Flock, MD      ? vitamin B-12 (CYANOCOBALAMIN) tablet 500 mcg  500 mcg Oral Daily Clarnce Flock, MD   500 mcg at 01/20/22 6789  ? ? ? ?Discharge Medications: ?Please see discharge summary for a list of discharge medications. ? ?Relevant Imaging Results: ? ?Relevant Lab Results: ? ? ?Additional Information ?SS #: [id number] ? ?Janaya Broy E Micheala Morissette, LCSW ? ? ? ? ?

## 2022-01-21 DIAGNOSIS — R600 Localized edema: Secondary | ICD-10-CM | POA: Diagnosis not present

## 2022-01-21 DIAGNOSIS — R58 Hemorrhage, not elsewhere classified: Secondary | ICD-10-CM | POA: Diagnosis not present

## 2022-01-21 DIAGNOSIS — W19XXXA Unspecified fall, initial encounter: Secondary | ICD-10-CM | POA: Diagnosis not present

## 2022-01-21 DIAGNOSIS — F0390 Unspecified dementia without behavioral disturbance: Secondary | ICD-10-CM | POA: Diagnosis not present

## 2022-01-21 LAB — GLUCOSE, CAPILLARY
Glucose-Capillary: 133 mg/dL — ABNORMAL HIGH (ref 70–99)
Glucose-Capillary: 169 mg/dL — ABNORMAL HIGH (ref 70–99)
Glucose-Capillary: 110 mg/dL — ABNORMAL HIGH (ref 70–99)
Glucose-Capillary: 127 mg/dL — ABNORMAL HIGH (ref 70–99)

## 2022-01-21 MED ORDER — SODIUM CHLORIDE 0.9 % IV BOLUS
250.0000 mL | Freq: Once | INTRAVENOUS | Status: AC
  Administered 2022-01-21: 250 mL via INTRAVENOUS

## 2022-01-21 NOTE — Progress Notes (Signed)
The nurse tech was unable to complete full set of orthostatics.  The lying and sitting vitals were done but when patient stood up, her knees buckled and she was immediately put back to bed before she fell.  Her blood pressure was 46/35 while standing.  Will continue to monitor. ?Terry Kaiser N ? ?

## 2022-01-21 NOTE — TOC Progression Note (Addendum)
Transition of Care (TOC) - Progression Note  ? ? ?Patient Details  ?Name: Terry Kaiser ?MRN: [id number] ?Date of Birth: Jan 21, 1936 ? ?Transition of Care (TOC) CM/SW Contact  ?Alley Neils E Benaiah Behan, LCSW ?Phone Number: ?01/21/2022, 10:09 AM ? ?Clinical Narrative:   Patient's son responded and stated they prefer [facility]. Explained [facility] is the SNF that does not take patients outside of their ILF/ALF. Asked for other preferences. Following for bed offers.  ? ?11:28- Patient's son asked about [facility] and [facility]. Explained [facility] is an option, CSW asked Neoma Laming in Admissions to review referral that was sent yesterday. Explained that [facility] is not the right level of care.  ? ? ?Expected Discharge Plan: [place] ?Barriers to Discharge: Continued Medical Work up ? ?Expected Discharge Plan and Services ?Expected Discharge Plan: [place] ?  ?  ?  ?Living arrangements for the past 2 months: [place] ?                ?  ?  ?  ?  ?  ?  ?  ?  ?  ?  ? ? ?Social Determinants of Health (SDOH) Interventions ?  ? ?Readmission Risk Interventions ? ?  01/20/2022  ? 12:26 PM  ?Readmission Risk Prevention Plan  ?Transportation Screening Complete  ?PCP or Specialist Appt within 5-7 Days Complete  ?Home Care Screening Complete  ?Medication Review (RN CM) Complete  ? ? ?

## 2022-01-21 NOTE — Plan of Care (Signed)

## 2022-01-21 NOTE — Progress Notes (Signed)
Orthostatics -120/58 standing  ?78/58 -lying  ?

## 2022-01-21 NOTE — Progress Notes (Signed)
?PROGRESS NOTE ? ? ? ?Terry Kaiser  GHW:[id number] DOB: 06/05/1936 DOA: 01/19/2022 ?PCP: Dion Body, MD  ? ? ?Brief Narrative:  ?Terry Kaiser is a 86 y.o. female with medical history significant for emphysema, mild aortic regurgitation, mitral valve regurgitation, persistent A-fib on Eliquis, type 2 diabetes controlled with diet, hypertension, chronic lower extremity edema, diverticulosis, chronic anemia, who presents from home after a fall. ? ?4/15 son at bedside. Pt fell this am while going to bathroom. Was told she was "outt" for a few seconds. Bp taken with sbp 80's. Repeat bp improved to 100's. Pt did not hit her head. Per son, pt has trouble findings words started few months ago.  ? ?4/16 orthostatics +.lying 120/64  sitting 78/58 ? ? ?Consultants:  ? ? ?Procedures:  ?Head CT ?No acute intracranial abnormality. ? ?Cervical spine CT ?No acute fracture or dislocation of the cervical or visualized upper ?thoracic spine ? ? ?MRI of right hip ?1. No hip fracture, dislocation or avascular necrosis. ?2. Severe hemorrhagic soft tissue contusion involving the ?subcutaneous fat overlying the right hip with a 8.2 x 3.4 x 8.7 cm ?hematoma. ? ?Antimicrobials:  ?  ? ? ?Subjective: ?Sleeping this am. Doesn't wake up for me ? ?Objective: ?Vitals:  ? 01/20/22 2006 01/21/22 7893 01/21/22 0522 01/21/22 0758  ?BP:  121/61 (!) 97/50 120/64  ?Pulse: 67 69 75 75  ?Resp: '17 17 18 18  '$ ?Temp: 98.6 ?F (37 ?C) 98.5 ?F (36.9 ?C) 98.5 ?F (36.9 ?C) 98.5 ?F (36.9 ?C)  ?TempSrc: Oral Oral Oral Oral  ?SpO2: 99% 99% 98%   ?Weight:      ?Height:      ? ? ?Intake/Output Summary (Last 24 hours) at 01/21/2022 1401 ?Last data filed at 01/21/2022 0900 ?Gross per 24 hour  ?Intake 60 ml  ?Output --  ?Net 60 ml  ? ?Filed Weights  ? 01/19/22 0926  ?Weight: 77.1 kg  ? ? ?Examination: ?Calm, NAD ?Anteriorly cta no wheezing ?Reg s1/s2 no gallop ?Soft benign +bs ?No edema ?Sleeping. Unable to assess neuro ?Mood and affect appropriate in current  setting  ? ? ? ?Data Reviewed: I have personally reviewed following labs and imaging studies ? ?CBC: ?Recent Labs  ?Lab 01/19/22 ?0929 01/19/22 ?1701 01/20/22 ?8101  ?WBC 8.9 9.0 7.7  ?NEUTROABS 7.8*  --   --   ?HGB 9.3*  9.6* 9.1* 8.4*  ?HCT 28.7*  30.0* 28.4* 25.5*  ?MCV 92.6 92.2 90.7  ?PLT 165 161 134*  ? ?Basic Metabolic Panel: ?Recent Labs  ?Lab 01/19/22 ?0929 01/20/22 ?0927  ?NA 140  --   ?K 3.1* 3.1*  ?CL 104  --   ?CO2 27  --   ?GLUCOSE 204*  --   ?BUN 31*  --   ?CREATININE 0.86  --   ?CALCIUM 8.7*  --   ? ?GFR: ?Estimated Creatinine Clearance: 52 mL/min (by C-G formula based on SCr of 0.86 mg/dL). ?Liver Function Tests: ?Recent Labs  ?Lab 01/19/22 ?0929  ?AST 26  ?ALT 17  ?ALKPHOS 42  ?BILITOT 1.1  ?PROT 6.3*  ?ALBUMIN 3.5  ? ?No results for input(s): LIPASE, AMYLASE in the last 168 hours. ?No results for input(s): AMMONIA in the last 168 hours. ?Coagulation Profile: ?Recent Labs  ?Lab 01/19/22 ?1701  ?INR 1.2  ? ?Cardiac Enzymes: ?Recent Labs  ?Lab 01/19/22 ?0929  ?CKTOTAL 205  ? ?BNP (last 3 results) ?No results for input(s): PROBNP in the last 8760 hours. ?HbA1C: ?No results for input(s): HGBA1C in the  last 72 hours. ?CBG: ?Recent Labs  ?Lab 01/20/22 ?1135 01/20/22 ?1738 01/20/22 ?2108 01/21/22 ?0755 01/21/22 ?1148  ?GLUCAP 181* 124* 140* 133* 169*  ? ?Lipid Profile: ?No results for input(s): CHOL, HDL, LDLCALC, TRIG, CHOLHDL, LDLDIRECT in the last 72 hours. ?Thyroid Function Tests: ?Recent Labs  ?  01/19/22 ?1701  ?TSH 0.604  ? ?Anemia Panel: ?Recent Labs  ?  01/19/22 ?1701  ?VITAMINB12 556  ?FERRITIN 125  ?TIBC 332  ?IRON 32  ? ?Sepsis Labs: ?No results for input(s): PROCALCITON, LATICACIDVEN in the last 168 hours. ? ?No results found for this or any previous visit (from the past 240 hour(s)).  ? ? ? ? ? ?Radiology Studies: ?MR BRAIN WO CONTRAST ? ?Result Date: 01/20/2022 ?CLINICAL DATA:  Mental status change, unknown cause. Frequent falls with progressive dementia. EXAM: MRI HEAD WITHOUT CONTRAST  TECHNIQUE: Multiplanar, multiecho pulse sequences of the brain and surrounding structures were obtained without intravenous contrast. COMPARISON:  CT head without contrast 01/19/2022 FINDINGS: Brain: No acute infarct, hemorrhage, or mass lesion is present. Moderate generalized atrophy is present. Periventricular and scattered subcortical T2 hyperintensities bilaterally are mildly advanced for age. Remote lacunar infarcts are present the cerebellum bilaterally. Remote lacunar infarcts are also present in the thalami bilaterally. Focal susceptibility present in the right thalamus. Scattered areas of punctate susceptibility are scattered throughout the cerebellum bilaterally The ventricles are of normal size. No significant extraaxial fluid collection is present. The internal auditory canals are within normal limits. Vascular: Flow is present in the major intracranial arteries. Skull and upper cervical spine: The craniocervical junction is normal. Upper cervical spine is within normal limits. Marrow signal is unremarkable. Sinuses/Orbits: The paranasal sinuses and mastoid air cells are clear. Bilateral lens replacements are noted. Globes and orbits are otherwise unremarkable. IMPRESSION: 1. No acute intracranial abnormality. 2. Moderate generalized atrophy and white matter disease likely reflects the sequela of chronic microvascular ischemia. 3. Remote lacunar infarcts of the cerebellum bilaterally and thalami bilaterally. 4. Scattered areas of punctate susceptibility scattered throughout the cerebellum bilaterally. This may be related to amyloid angiopathy. Electronically Signed   By: San Morelle M.D.   On: 01/20/2022 18:53   ? ? ? ? ? ?Scheduled Meds: ? amiodarone  200 mg Oral BID  ? escitalopram  5 mg Oral Daily  ? insulin aspart  0-6 Units Subcutaneous TID WC  ? pantoprazole  20 mg Oral Daily  ? potassium chloride  20 mEq Oral BID  ? pravastatin  40 mg Oral q1800  ? sodium chloride flush  3 mL Intravenous  Q12H  ? vitamin B-12  500 mcg Oral Daily  ? ?Continuous Infusions: ? ?Assessment & Plan: ?  ?Principal Problem: ?  Fall ?Active Problems: ?  DNR (do not resuscitate) ?  Emphysema of lung ([place]) ?  Essential hypertension ?  GAD (generalized anxiety disorder) ?  Bilateral leg edema ?  History of normocytic normochromic anemia ?  Mild aortic regurgitation ?  Nonrheumatic mitral valve regurgitation ?  Persistent atrial fibrillation ([place]) ?  Type 2 diabetes mellitus with hyperlipidemia ([place]) ?  Known medical problems ?  Dementia without behavioral disturbance ([place]) ?  Hypokalemia ?  Ecchymosis ? ? ?Terry Kaiser is a 86 y.o. female with medical history significant for emphysema, mild aortic regurgitation, mitral valve regurgitation, persistent A-fib on Eliquis, type 2 diabetes controlled with diet, hypertension, chronic lower extremity edema, diverticulosis, chronic anemia, who presents from home after a fall with decrease in baseline hemoglobin as well as concerns for  physical deconditioning and worsening dementia. ?  ?Fall ?Significant fall leading to significant hematoma.  Per discussion with son normally has good mobility but is high risk for deconditioning. ?4/15 had fall this am. ?syncope found with low sbp 80's. Improved. ?Place on tele. ?4/16 echo pending ?She is positive for orthostatics. Will hold bp meds ?Give ivf, ecs 20-34mHg. ?Will add bp meds slowly.  ?  ?Dementia [place] behavioral disturbance [place] ?Progressively worse per son. ?Has trouble findings words>>>will obtain MRI ?Rpr , hiv all nml ?4/16 MRI with remote lacunar infarct in cereb. Finds also found with related to amyloid angiopathy ?Needs to f/u neurology as outpt next month has appointment. ?  ?History of normocytic normochromic anemia ?Chronic with unclear etiology. ?- Continue home B12 supplements ?- Consider outpatient colonoscopy ?with hematoma from fall ?4/16 hg down. ?Will ck am cbc ?  ?Hypokalemia ?Will replace ? ? ?Ecchymosis ?Large  hematoma seen on MRI, hemoglobin has dropped about 2 points from 11.7-9.6, will trend CBC and monitor clinically.   ?4/15 hold eliquis for now ? ? ?  ?Known medical problems ?DM2-diet controlled at baseline, CBG

## 2022-01-22 ENCOUNTER — Inpatient Hospital Stay (HOSPITAL_COMMUNITY)
Admit: 2022-01-22 | Discharge: 2022-01-22 | Disposition: A | Discharge status: Home / Self Care | Payer: Medicare HMO | Attending: Internal Medicine | Admitting: Internal Medicine

## 2022-01-22 DIAGNOSIS — R600 Localized edema: Secondary | ICD-10-CM | POA: Diagnosis not present

## 2022-01-22 DIAGNOSIS — R55 Syncope and collapse: Secondary | ICD-10-CM | POA: Diagnosis not present

## 2022-01-22 DIAGNOSIS — Z66 Do not resuscitate: Secondary | ICD-10-CM | POA: Diagnosis not present

## 2022-01-22 DIAGNOSIS — F0390 Unspecified dementia without behavioral disturbance: Secondary | ICD-10-CM | POA: Diagnosis not present

## 2022-01-22 DIAGNOSIS — W19XXXD Unspecified fall, subsequent encounter: Secondary | ICD-10-CM | POA: Diagnosis not present

## 2022-01-22 LAB — GLUCOSE, CAPILLARY
Glucose-Capillary: 125 mg/dL — ABNORMAL HIGH (ref 70–99)
Glucose-Capillary: 116 mg/dL — ABNORMAL HIGH (ref 70–99)
Glucose-Capillary: 118 mg/dL — ABNORMAL HIGH (ref 70–99)
Glucose-Capillary: 113 mg/dL — ABNORMAL HIGH (ref 70–99)

## 2022-01-22 LAB — ECHOCARDIOGRAM COMPLETE
AR max vel: 3.72 cm2
AV Area VTI: 3.62 cm2
AV Area mean vel: 3.53 cm2
AV Mean grad: 2 mmHg
AV Peak grad: 4.2 mmHg
Ao pk vel: 1.03 m/s
Area-P 1/2: 3.7 cm2
Height: 66 in
MV VTI: 2.52 cm2
S' Lateral: 2.8 cm
Weight: 2720 oz

## 2022-01-22 LAB — CBC
HCT: 25.1 % — ABNORMAL LOW (ref 36.0–46.0)
Hemoglobin: 8.4 g/dL — ABNORMAL LOW (ref 12.0–15.0)
MCH: 30.5 pg (ref 26.0–34.0)
MCHC: 33.5 g/dL (ref 30.0–36.0)
MCV: 91.3 fL (ref 80.0–100.0)
Platelets: 141 10*3/uL — ABNORMAL LOW (ref 150–400)
RBC: 2.75 MIL/uL — ABNORMAL LOW (ref 3.87–5.11)
RDW: 13.1 % (ref 11.5–15.5)
WBC: 6.4 10*3/uL (ref 4.0–10.5)
nRBC: 0 % (ref 0.0–0.2)

## 2022-01-22 LAB — MAGNESIUM: Magnesium: 1.8 mg/dL (ref 1.7–2.4)

## 2022-01-22 LAB — POTASSIUM: Potassium: 3.3 mmol/L — ABNORMAL LOW (ref 3.5–5.1)

## 2022-01-22 MED ORDER — LACTATED RINGERS IV SOLN: INTRAVENOUS | Status: DC

## 2022-01-22 MED ORDER — POTASSIUM CHLORIDE 10 MEQ/100ML IV SOLN
10.0000 meq | INTRAVENOUS | Status: AC
  Administered 2022-01-22 (×4): 10 meq via INTRAVENOUS
  Filled 2022-01-22: qty 100

## 2022-01-22 NOTE — Progress Notes (Signed)
?PROGRESS NOTE ? ? ? ?Terry Kaiser  OYD:[id number] DOB: 24-Jun-1936 DOA: 01/19/2022 ?PCP: Dion Body, MD  ? ? ?Brief Narrative:  ?Terry Kaiser is a 86 y.o. female with medical history significant for emphysema, mild aortic regurgitation, mitral valve regurgitation, persistent A-fib on Eliquis, type 2 diabetes controlled with diet, hypertension, chronic lower extremity edema, diverticulosis, chronic anemia, who presents from home after a fall. ? ?4/15 son at bedside. Pt fell this am while going to bathroom. Was told she was "outt" for a few seconds. Bp taken with sbp 80's. Repeat bp improved to 100's. Pt did not hit her head. Per son, pt has trouble findings words started few months ago.  ? ?4/16 orthostatics +.lying 120/64  sitting 78/58 ? ? ?Consultants:  ? ? ?Procedures:  ?Head CT ?No acute intracranial abnormality. ? ?Cervical spine CT ?No acute fracture or dislocation of the cervical or visualized upper ?thoracic spine ? ? ?MRI of right hip ?1. No hip fracture, dislocation or avascular necrosis. ?2. Severe hemorrhagic soft tissue contusion involving the ?subcutaneous fat overlying the right hip with a 8.2 x 3.4 x 8.7 cm ?hematoma. ? ?Antimicrobials:  ?  ? ? ?Subjective: ?Sleeping this am. Doesn't wake up for me ? ?Objective: ?Vitals:  ? 01/21/22 1520 01/21/22 1929 01/22/22 0328 01/22/22 0818  ?BP: (!) 139/50 (!) 143/61 (!) 158/64 (!) 163/65  ?Pulse: 65 65 70 81  ?Resp: '18 16 16 16  '$ ?Temp: 98.1 ?F (36.7 ?C) 98 ?F (36.7 ?C) 97.9 ?F (36.6 ?C) 97.7 ?F (36.5 ?C)  ?TempSrc: Oral     ?SpO2: 99% 100% 98% 100%  ?Weight:      ?Height:      ? ? ?Intake/Output Summary (Last 24 hours) at 01/22/2022 1434 ?Last data filed at 01/22/2022 1014 ?Gross per 24 hour  ?Intake 240 ml  ?Output --  ?Net 240 ml  ? ?Filed Weights  ? 01/19/22 0926  ?Weight: 77.1 kg  ? ? ?Examination: ?Calm, NAD ?Anteriorly cta no wheezing ?Reg s1/s2 no gallop ?Soft benign +bs ?No edema ?Sleeping. Unable to assess neuro ?Mood and affect appropriate  in current setting  ? ? ? ?Data Reviewed: I have personally reviewed following labs and imaging studies ? ?CBC: ?Recent Labs  ?Lab 01/19/22 ?0929 01/19/22 ?1701 01/20/22 ?6720 01/22/22 ?9470  ?WBC 8.9 9.0 7.7 6.4  ?NEUTROABS 7.8*  --   --   --   ?HGB 9.3*  9.6* 9.1* 8.4* 8.4*  ?HCT 28.7*  30.0* 28.4* 25.5* 25.1*  ?MCV 92.6 92.2 90.7 91.3  ?PLT 165 161 134* 141*  ? ?Basic Metabolic Panel: ?Recent Labs  ?Lab 01/19/22 ?0929 01/20/22 ?0927 01/22/22 ?9628  ?NA 140  --   --   ?K 3.1* 3.1* 3.3*  ?CL 104  --   --   ?CO2 27  --   --   ?GLUCOSE 204*  --   --   ?BUN 31*  --   --   ?CREATININE 0.86  --   --   ?CALCIUM 8.7*  --   --   ?MG  --   --  1.8  ? ?GFR: ?Estimated Creatinine Clearance: 52 mL/min (by C-G formula based on SCr of 0.86 mg/dL). ?Liver Function Tests: ?Recent Labs  ?Lab 01/19/22 ?0929  ?AST 26  ?ALT 17  ?ALKPHOS 42  ?BILITOT 1.1  ?PROT 6.3*  ?ALBUMIN 3.5  ? ?No results for input(s): LIPASE, AMYLASE in the last 168 hours. ?No results for input(s): AMMONIA in the last 168 hours. ?Coagulation Profile: ?  Recent Labs  ?Lab 01/19/22 ?1701  ?INR 1.2  ? ?Cardiac Enzymes: ?Recent Labs  ?Lab 01/19/22 ?0929  ?CKTOTAL 205  ? ?BNP (last 3 results) ?No results for input(s): PROBNP in the last 8760 hours. ?HbA1C: ?No results for input(s): HGBA1C in the last 72 hours. ?CBG: ?Recent Labs  ?Lab 01/21/22 ?1148 01/21/22 ?1706 01/21/22 ?2045 01/22/22 ?1937 01/22/22 ?1148  ?GLUCAP 169* 110* 127* 125* 116*  ? ?Lipid Profile: ?No results for input(s): CHOL, HDL, LDLCALC, TRIG, CHOLHDL, LDLDIRECT in the last 72 hours. ?Thyroid Function Tests: ?Recent Labs  ?  01/19/22 ?1701  ?TSH 0.604  ? ?Anemia Panel: ?Recent Labs  ?  01/19/22 ?1701  ?VITAMINB12 556  ?FERRITIN 125  ?TIBC 332  ?IRON 32  ? ?Sepsis Labs: ?No results for input(s): PROCALCITON, LATICACIDVEN in the last 168 hours. ? ?No results found for this or any previous visit (from the past 240 hour(s)).  ? ? ? ? ? ?Radiology Studies: ?MR BRAIN WO CONTRAST ? ?Result Date:  01/20/2022 ?CLINICAL DATA:  Mental status change, unknown cause. Frequent falls with progressive dementia. EXAM: MRI HEAD WITHOUT CONTRAST TECHNIQUE: Multiplanar, multiecho pulse sequences of the brain and surrounding structures were obtained without intravenous contrast. COMPARISON:  CT head without contrast 01/19/2022 FINDINGS: Brain: No acute infarct, hemorrhage, or mass lesion is present. Moderate generalized atrophy is present. Periventricular and scattered subcortical T2 hyperintensities bilaterally are mildly advanced for age. Remote lacunar infarcts are present the cerebellum bilaterally. Remote lacunar infarcts are also present in the thalami bilaterally. Focal susceptibility present in the right thalamus. Scattered areas of punctate susceptibility are scattered throughout the cerebellum bilaterally The ventricles are of normal size. No significant extraaxial fluid collection is present. The internal auditory canals are within normal limits. Vascular: Flow is present in the major intracranial arteries. Skull and upper cervical spine: The craniocervical junction is normal. Upper cervical spine is within normal limits. Marrow signal is unremarkable. Sinuses/Orbits: The paranasal sinuses and mastoid air cells are clear. Bilateral lens replacements are noted. Globes and orbits are otherwise unremarkable. IMPRESSION: 1. No acute intracranial abnormality. 2. Moderate generalized atrophy and white matter disease likely reflects the sequela of chronic microvascular ischemia. 3. Remote lacunar infarcts of the cerebellum bilaterally and thalami bilaterally. 4. Scattered areas of punctate susceptibility scattered throughout the cerebellum bilaterally. This may be related to amyloid angiopathy. Electronically Signed   By: San Morelle M.D.   On: 01/20/2022 18:53  ? ?ECHOCARDIOGRAM COMPLETE ? ?Result Date: 01/22/2022 ?   ECHOCARDIOGRAM REPORT   Patient Name:   Terry Kaiser Date of Exam: 01/22/2022 Medical Rec #:   [id number]      Height:       66.0 in Accession #:    [id number]     Weight:       170.0 lb Date of Birth:  [birth year]     BSA:          1.866 m? Patient Age:    61 years       BP:           58/64 mmHg Patient Gender: F              HR:           67 bpm. Exam Location:  [facility] Procedure: 2D Echo, Cardiac Doppler and Color Doppler Indications:     R55 Syncope  History:         Patient has no prior history of Echocardiogram examinations.  Arrythmias:Atrial Fibrillation; Risk Factors:Former Smoker,                  Diabetes, Hypertension and Dyslipidemia.  Sonographer:     Rosalia Hammers Referring Phys:  [id number] [place] Diagnosing Phys: Kathlyn Sacramento MD  Sonographer Comments: Image acquisition challenging due to uncooperative patient and Image acquisition challenging due to respiratory motion. IMPRESSIONS  1. Left ventricular ejection fraction, by estimation, is 55 to 60%. The left ventricle has normal function. The left ventricle has no regional wall motion abnormalities. There is mild left ventricular hypertrophy. Left ventricular diastolic parameters are indeterminate.  2. Right ventricular systolic function is normal. The right ventricular size is normal.  3. Left atrial size was mildly dilated.  4. The mitral valve is normal in structure. No evidence of mitral valve regurgitation. No evidence of mitral stenosis. Moderate mitral annular calcification.  5. The aortic valve is calcified. Aortic valve regurgitation is not visualized. Aortic valve sclerosis/calcification is present, without any evidence of aortic stenosis.  6. The inferior vena cava is normal in size with <50% respiratory variability, suggesting right atrial pressure of 8 mmHg. FINDINGS  Left Ventricle: Left ventricular ejection fraction, by estimation, is 55 to 60%. The left ventricle has normal function. The left ventricle has no regional wall motion abnormalities. The left ventricular internal cavity size was normal in size. There  is  mild left ventricular hypertrophy. Left ventricular diastolic parameters are indeterminate. Right Ventricle: The right ventricular size is normal. No increase in right ventricular wall thickness. Right ventric

## 2022-01-22 NOTE — TOC Progression Note (Addendum)
Transition of Care (TOC) - Progression Note  ? ? ?Patient Details  ?Name: Terry Kaiser ?MRN: [id number] ?Date of Birth: Feb 02, 1936 ? ?Transition of Care (TOC) CM/SW Contact  ?Candie Chroman, LCSW ?Phone Number: ?01/22/2022, 3:35 PM ? ?Clinical Narrative:   Called son and provided bed offers. He chose [facility]. Patient is not managed by [facility] so left message for SNF admissions coordinator to provide update and asked her to start insurance authorization through [facility]. ? ?3:50 pm: Son called back and said he wants [facility] instead. They will not have a bed tomorrow but will on Wednesday. Admissions coordinator is not at the facility now so she will start auth in the morning.  ? ?Expected Discharge Plan: [place] ?Barriers to Discharge: Continued Medical Work up ? ?Expected Discharge Plan and Services ?Expected Discharge Plan: [place] ?  ?  ?  ?Living arrangements for the past 2 months: [place] ?                ?  ?  ?  ?  ?  ?  ?  ?  ?  ?  ? ? ?Social Determinants of Health (SDOH) Interventions ?  ? ?Readmission Risk Interventions ? ?  01/20/2022  ? 12:26 PM  ?Readmission Risk Prevention Plan  ?Transportation Screening Complete  ?PCP or Specialist Appt within 5-7 Days Complete  ?Home Care Screening Complete  ?Medication Review (RN CM) Complete  ? ? ?

## 2022-01-22 NOTE — Progress Notes (Signed)
Physical Therapy Treatment ?Patient Details ?Name: Terry Kaiser ?MRN: [id number] ?DOB: [birth year] ?Today's Date: 01/22/2022 ? ? ?History of Present Illness Pt is an 86 y.o. female presenting to hospital 4/14 s/p fall.  Worsening dementia/progressive decline noted (pt has been having memory issues and wording finding difficulty).  Imaging showing "severe hemorrhagic soft tissue contusion involving the subcutaneous fat overlying the right hip with a 8.2 x 3.4 x 8.7 cm hematoma."  Pt admitted with R hip contusion s/p fall (significant hematoma), worsening dementia, and hypokalemia.  PMH includes emphysema, mild aortic regurgitation, MV regurgitation, persistent a-fib on Eliquis, DM, htn, chronic LE edema, diverticulosis, chronic anemia. ? ?  ?PT Comments  ? ? Pt resting in bed upon PT arrival (pt had recently returned to bed after toileting on [facility] with 2 nursing assist); pt agreeable to PT session.  Orthostatics taken during session:  supine BP 126/55 with HR 74 bpm; sitting BP 109/50 with HR 71 bpm; and standing BP at 0 minutes 98/54 with HR 84 bpm.  Pt symptomatic in standing while obtaining BP--pt reporting significant dizziness and felt like she was going to pass out so pt assisted back to bed and BP 141/63 resting in bed.  MD and nurse notified of pt's BP vitals and symptoms.  Will continue to focus on strengthening and progressive functional mobility during hospitalization. ?   ?Recommendations for follow up therapy are one component of a multi-disciplinary discharge planning process, led by the attending physician.  Recommendations may be updated based on patient status, additional functional criteria and insurance authorization. ? ?Follow Up Recommendations ? Skilled nursing-short term rehab (<3 hours/day) ?  ?  ?Assistance Recommended at Discharge Frequent or constant Supervision/Assistance  ?Patient can return home with the following A lot of help with walking and/or transfers;A lot of help with  bathing/dressing/bathroom;Assistance with cooking/housework;Direct supervision/assist for medications management;Assist for transportation ?  ?Equipment Recommendations ? Rolling walker (2 wheels);BSC/3in1;Wheelchair (measurements PT);Wheelchair cushion (measurements PT)  ?  ?Recommendations for Other Services OT consult ? ? ?  ?Precautions / Restrictions Precautions ?Precautions: Fall ?Precaution Comments: Orthostatic ?Restrictions ?Weight Bearing Restrictions: No  ?  ? ?Mobility ? Bed Mobility ?Overal bed mobility: Needs Assistance ?Bed Mobility: Supine to Sit, Sit to Supine ?  ?  ?Supine to sit: Supervision, HOB elevated ?Sit to supine: Supervision, HOB elevated ?  ?General bed mobility comments: SBA for safety; assist for lines ?  ? ?Transfers ?Overall transfer level: Needs assistance ?Equipment used: Rolling walker (2 wheels) ?Transfers: Sit to/from Stand ?Sit to Stand: Min assist ?  ?  ?  ?  ?  ?General transfer comment: vc's/tactile cues for UE placement; assist for balance ?  ? ?Ambulation/Gait ?Ambulation/Gait assistance:  (deferred d/t pt symptomatic in standing and needing to sit back down) ?  ?  ?  ?  ?  ?  ?  ? ? ?Stairs ?  ?  ?  ?  ?  ? ? ?Wheelchair Mobility ?  ? ?Modified Rankin (Stroke Patients Only) ?  ? ? ?  ?Balance Overall balance assessment: Needs assistance ?Sitting-balance support: No upper extremity supported, Feet supported ?Sitting balance-Leahy Scale: Good ?Sitting balance - Comments: steady sitting reaching within BOS ?  ?Standing balance support: No upper extremity supported ?Standing balance-Leahy Scale: Poor ?Standing balance comment: mild sway/unsteadiness noted in standing without UE support requiring CGA to min assist for balance ?  ?  ?  ?  ?  ?  ?  ?  ?  ?  ?  ?  ? ?  ?  Cognition Arousal/Alertness: Awake/alert ?Behavior During Therapy: [facility] for tasks assessed/performed ?Overall Cognitive Status: History of cognitive impairments - at baseline ?  ?  ?  ?  ?  ?  ?  ?  ?  ?  ?  ?  ?  ?   ?  ?  ?General Comments: Difficulty with word finding at times (has increased in last month per family) ?  ?  ? ?  ?Exercises General Exercises - Lower Extremity ?Long Arc Quad: AROM, Strengthening, Both, 10 reps, Seated ?Hip Flexion/Marching: AROM, Strengthening, Both, 10 reps, Seated ? ?  ?General Comments  Nursing cleared pt for participation in physical therapy.  Pt agreeable to PT session. ?  ?  ? ?Pertinent Vitals/Pain Pain Assessment ?Pain Assessment: Faces ?Faces Pain Scale: Hurts a little bit ?Pain Location: R hip with mobiltiy ?Pain Descriptors / Indicators: Discomfort ?Pain Intervention(s): Limited activity within patient's tolerance, Monitored during session, Repositioned ?HR and O2 sats on room air WFL during sessions activities.  ? ? ?Home Living   ?  ?  ?  ?  ?  ?  ?  ?  ?  ?   ?  ?Prior Function    ?  ?  ?   ? ?PT Goals (current goals can now be found in the care plan section) Acute Rehab PT Goals ?Patient Stated Goal: to improve mobility ?PT Goal Formulation: With patient/family ?Time For Goal Achievement: 02/02/22 ?Potential to Achieve Goals: Fair ?Progress towards PT goals: Progressing toward goals ? ?  ?Frequency ? ? ? Min 2X/week ? ? ? ?  ?PT Plan Current plan remains appropriate  ? ? ?Co-evaluation   ?  ?  ?  ?  ? ?  ?AM-PAC PT "6 Clicks" Mobility   ?Outcome Measure ? Help needed turning from your back to your side while in a flat bed without using bedrails?: A Little ?Help needed moving from lying on your back to sitting on the side of a flat bed without using bedrails?: A Little ?Help needed moving to and from a bed to a chair (including a wheelchair)?: A Little ?Help needed standing up from a chair using your arms (e.g., wheelchair or bedside chair)?: A Little ?Help needed to walk in hospital room?: A Lot ?Help needed climbing 3-5 steps with a railing? : Total ?6 Click Score: 15 ? ?  ?End of Session Equipment Utilized During Treatment: Gait belt ?Activity Tolerance: Treatment limited  secondary to medical complications (Comment) (limited d/t pt's dizziness in standing) ?Patient left: in bed;with call bell/phone within reach;with bed alarm set ?Nurse Communication: Mobility status;Precautions;Other (comment) (pt's orthostatic vitals) ?PT Visit Diagnosis: Unsteadiness on feet (R26.81);Other abnormalities of gait and mobility (R26.89);Muscle weakness (generalized) (M62.81);History of falling (Z91.81);Pain ?Pain - Right/Left: Right ?Pain - part of body: Hip ?  ? ? ?Time: 8469-6295 ?PT Time Calculation (min) (ACUTE ONLY): 24 min ? ?Charges:  $Therapeutic Exercise: 8-22 mins ?$Therapeutic Activity: 8-22 mins          ?          ?Leitha Bleak, PT ?01/22/22, 10:12 AM ? ? ?

## 2022-01-22 NOTE — Progress Notes (Signed)
Occupational Therapy Treatment ?Patient Details ?Name: Terry Kaiser ?MRN: [id number] ?DOB: 05/17/1936 ?Today's Date: 01/22/2022 ? ? ?History of present illness Pt is an 86 y.o. female presenting to hospital 4/14 s/p fall.  Worsening dementia/progressive decline noted (pt has been having memory issues and wording finding difficulty).  Imaging showing "severe hemorrhagic soft tissue contusion involving the subcutaneous fat overlying the right hip with a 8.2 x 3.4 x 8.7 cm hematoma."  Pt admitted with R hip contusion s/p fall (significant hematoma), worsening dementia, and hypokalemia.  PMH includes emphysema, mild aortic regurgitation, MV regurgitation, persistent a-fib on Eliquis, DM, htn, chronic LE edema, diverticulosis, chronic anemia. ?  ?OT comments ? Pt seen for OT treatment on this date. Upon arrival to room, pt awake and seated upright in bed. Pt initially declining to participate in OT tx 2/2 fatigue. Pt educated on the importance of continue participation in self-care tasks and functional mobility to improve activity tolerance and maintain strength. Following moderate encouragement, pt agreeable to light OT session. Pt required SUPERVISION for bed mobility. Once seated EOB, pt performed x4 seated grooming tasks requiring SUPERVISION/SET-UP and participated in x3 seated UB/LB therex. Pt performed sit>stand transfer and took 3-4 lateral steps toward [facility] requiring MIN A d/t decreased balance and strength. Pt denied dizziness throughout session. Pt is making good progress toward goals and continues to benefit from skilled OT services to maximize return to PLOF and minimize risk of future falls, injury, caregiver burden, and readmission. Will continue to follow POC. Discharge recommendation remains appropriate.   ? ?Orthostatic vitals obtained: ?Supine (in semi-fowler's position in bed): BP 150/57, HR 83, SpO2 100% ?Seated: BP 132/64, HR 91, SpO2 100% ?Seated (following seated grooming tasks and exercises): BP  127/68, HR 84, SpO2 99% ?Standing: pt did not stand long enough to obtain ?  ? ?Recommendations for follow up therapy are one component of a multi-disciplinary discharge planning process, led by the attending physician.  Recommendations may be updated based on patient status, additional functional criteria and insurance authorization. ?   ?Follow Up Recommendations ? Skilled nursing-short term rehab (<3 hours/day)  ?  ?Assistance Recommended at Discharge Frequent or constant Supervision/Assistance  ?Patient can return home with the following ? A lot of help with walking and/or transfers;Assistance with cooking/housework;Direct supervision/assist for medications management;Direct supervision/assist for financial management;Help with stairs or ramp for entrance ?  ?Equipment Recommendations ? BSC/3in1  ?  ?   ?Precautions / Restrictions Precautions ?Precautions: Fall ?Precaution Comments: Orthostatic ?Restrictions ?Weight Bearing Restrictions: No  ? ? ?  ? ?Mobility Bed Mobility ?Overal bed mobility: Needs Assistance ?Bed Mobility: Supine to Sit, Sit to Supine ?  ?  ?Supine to sit: Supervision, HOB elevated ?Sit to supine: Supervision ?  ?  ?  ? ?Transfers ?Overall transfer level: Needs assistance ?Equipment used: Rolling walker (2 wheels) ?Transfers: Sit to/from Stand ?Sit to Stand: Min assist ?  ?  ?  ?  ?  ?General transfer comment: Requires MIN A for steadying when upright ?  ?  ?Balance Overall balance assessment: Needs assistance ?Sitting-balance support: No upper extremity supported, Feet supported ?Sitting balance-Leahy Scale: Good ?Sitting balance - Comments: steady sitting reaching within BOS ?  ?Standing balance support: Single extremity supported, During functional activity ?Standing balance-Leahy Scale: Poor ?Standing balance comment: Requires MIN A for steadying when standing ?  ?  ?  ?  ?  ?  ?  ?  ?  ?  ?  ?   ? ?ADL either performed or  assessed with clinical judgement  ? ?ADL Overall ADL's : Needs  assistance/impaired ?  ?  ?Grooming: Dance movement psychotherapist;Wash/dry hands;Oral care;Brushing hair;Supervision/safety;Set up;Sitting ?  ?  ?  ?  ?  ?  ?  ?  ?  ?  ?  ?  ?  ?  ?  ?  ?  ?  ? ? ? ?Cognition Arousal/Alertness: Awake/alert ?Behavior During Therapy: [facility] for tasks assessed/performed ?Overall Cognitive Status: History of cognitive impairments - at baseline ?  ?  ?  ?  ?  ?  ?  ?  ?  ?  ?  ?  ?  ?  ?  ?  ?General Comments: Difficult to assess 2/2 pt being HOH and having baseline difficutly with word finding, however able to demonstrate that she was oriented to self, place, month/year, and parts of situation. ?  ?  ?   ?   ?   ?   ? ? ?Pertinent Vitals/ Pain       Pain Assessment ?Pain Assessment: Faces ?Faces Pain Scale: Hurts a little bit ?Pain Location: R hip with mobiltiy ?Pain Descriptors / Indicators: Discomfort ?Pain Intervention(s): Limited activity within patient's tolerance, Monitored during session, Repositioned ? ?   ?   ? ?Frequency ? Min 2X/week  ? ? ? ? ?  ?Progress Toward Goals ? ?OT Goals(current goals can now be found in the care plan section) ? Progress towards OT goals: Progressing toward goals ? ?Acute Rehab OT Goals ?Patient Stated Goal: to feel better ?OT Goal Formulation: With patient/family ?Time For Goal Achievement: 02/02/22 ?Potential to Achieve Goals: Good  ?Plan Discharge plan remains appropriate;Frequency remains appropriate   ? ?   ?AM-PAC OT "6 Clicks" Daily Activity     ?Outcome Measure ? ? Help from another person eating meals?: A Little ?Help from another person taking care of personal grooming?: A Little ?Help from another person toileting, which includes using toliet, bedpan, or urinal?: A Lot ?Help from another person bathing (including washing, rinsing, drying)?: A Lot ?Help from another person to put on and taking off regular upper body clothing?: A Little ?Help from another person to put on and taking off regular lower body clothing?: A Lot ?6 Click Score: 15 ? ?  ?End of  Session Equipment Utilized During Treatment: Rolling walker (2 wheels) ? ?OT Visit Diagnosis: Other abnormalities of gait and mobility (R26.89);Muscle weakness (generalized) (M62.81) ?  ?Activity Tolerance Patient tolerated treatment well ?  ?Patient Left in bed;with call bell/phone within reach;with bed alarm set;with family/visitor present ?  ?Nurse Communication Mobility status ?  ? ?   ? ?Time: 7867-6720 ?OT Time Calculation (min): 39 min ? ?Charges: OT General Charges ?$OT Visit: 1 Visit ?OT Treatments ?$Self Care/Home Management : 38-52 mins ? ?Fredirick Maudlin, OTR/L ?[place] ? ?

## 2022-01-22 NOTE — Care Management Important Message (Signed)
Important Message ? ?Patient Details  ?Name: Terry Kaiser ?MRN: [id number] ?Date of Birth: 1936-08-16 ? ? ?Medicare Important Message Given:  N/A - LOS <3 / Initial given by admissions ? ? ? ? ?[name] ?01/22/2022, 11:05 AM ?

## 2022-01-23 DIAGNOSIS — Z66 Do not resuscitate: Secondary | ICD-10-CM | POA: Diagnosis not present

## 2022-01-23 DIAGNOSIS — R600 Localized edema: Secondary | ICD-10-CM | POA: Diagnosis not present

## 2022-01-23 DIAGNOSIS — F0390 Unspecified dementia without behavioral disturbance: Secondary | ICD-10-CM | POA: Diagnosis not present

## 2022-01-23 DIAGNOSIS — W19XXXD Unspecified fall, subsequent encounter: Secondary | ICD-10-CM | POA: Diagnosis not present

## 2022-01-23 LAB — GLUCOSE, CAPILLARY
Glucose-Capillary: 146 mg/dL — ABNORMAL HIGH (ref 70–99)
Glucose-Capillary: 115 mg/dL — ABNORMAL HIGH (ref 70–99)
Glucose-Capillary: 126 mg/dL — ABNORMAL HIGH (ref 70–99)

## 2022-01-23 NOTE — Progress Notes (Signed)
?PROGRESS NOTE ? ? ? ?TIMBERLY YOTT  VOJ:[id number] DOB: 04-21-1936 DOA: 01/19/2022 ?PCP: Dion Body, MD  ? ? ?Brief Narrative:  ?SHAKEMA SURITA is a 86 y.o. female with medical history significant for emphysema, mild aortic regurgitation, mitral valve regurgitation, persistent A-fib on Eliquis, type 2 diabetes controlled with diet, hypertension, chronic lower extremity edema, diverticulosis, chronic anemia, who presents from home after a fall.  ?Found with orthostatics. Bp meds all held, started on ivf. Now more stable. SNF pending.  ? ?4/15 son at bedside. Pt fell this am while going to bathroom. Was told she was "outt" for a few seconds. Bp taken with sbp 80's. Repeat bp improved to 100's. Pt did not hit her head. Per son, pt has trouble findings words started few months ago.  ? ?4/16 orthostatics +.lying 120/64  sitting 78/58 ?4/18 orthostatics improved. ? ? ?Consultants:  ? ? ?Procedures:  ?Head CT ?No acute intracranial abnormality. ? ?Cervical spine CT ?No acute fracture or dislocation of the cervical or visualized upper ?thoracic spine ? ? ?MRI of right hip ?1. No hip fracture, dislocation or avascular necrosis. ?2. Severe hemorrhagic soft tissue contusion involving the ?subcutaneous fat overlying the right hip with a 8.2 x 3.4 x 8.7 cm ?hematoma. ? ?Antimicrobials:  ?  ? ? ?Subjective: ?Wakes up, no complaints. Denies dizziness, sob, cp ? ?Objective: ?Vitals:  ? 01/23/22 1051 01/23/22 1053 01/23/22 1055 01/23/22 1503  ?BP: 130/64 117/69 113/62 (!) 141/68  ?Pulse: 72 73 89 75  ?Resp: 16   16  ?Temp:    99.3 ?F (37.4 ?C)  ?TempSrc:    Oral  ?SpO2: 98% 99% 98% 97%  ?Weight:      ?Height:      ? ? ?Intake/Output Summary (Last 24 hours) at 01/23/2022 1525 ?Last data filed at 01/23/2022 1025 ?Gross per 24 hour  ?Intake 360 ml  ?Output --  ?Net 360 ml  ? ?Filed Weights  ? 01/19/22 0926  ?Weight: 77.1 kg  ? ? ?Examination: ?Calm, NAD ?Cta no w/r ?Reg s1/s2 no gallop ?Soft benign +bs ?No edema ?Awake grossly  intact ?Mood and affect appropriate in current setting  ? ? ? ?Data Reviewed: I have personally reviewed following labs and imaging studies ? ?CBC: ?Recent Labs  ?Lab 01/19/22 ?0929 01/19/22 ?1701 01/20/22 ?9937 01/22/22 ?1696  ?WBC 8.9 9.0 7.7 6.4  ?NEUTROABS 7.8*  --   --   --   ?HGB 9.3*  9.6* 9.1* 8.4* 8.4*  ?HCT 28.7*  30.0* 28.4* 25.5* 25.1*  ?MCV 92.6 92.2 90.7 91.3  ?PLT 165 161 134* 141*  ? ?Basic Metabolic Panel: ?Recent Labs  ?Lab 01/19/22 ?0929 01/20/22 ?0927 01/22/22 ?7893  ?NA 140  --   --   ?K 3.1* 3.1* 3.3*  ?CL 104  --   --   ?CO2 27  --   --   ?GLUCOSE 204*  --   --   ?BUN 31*  --   --   ?CREATININE 0.86  --   --   ?CALCIUM 8.7*  --   --   ?MG  --   --  1.8  ? ?GFR: ?Estimated Creatinine Clearance: 52 mL/min (by C-G formula based on SCr of 0.86 mg/dL). ?Liver Function Tests: ?Recent Labs  ?Lab 01/19/22 ?0929  ?AST 26  ?ALT 17  ?ALKPHOS 42  ?BILITOT 1.1  ?PROT 6.3*  ?ALBUMIN 3.5  ? ?No results for input(s): LIPASE, AMYLASE in the last 168 hours. ?No results for input(s): AMMONIA in the  last 168 hours. ?Coagulation Profile: ?Recent Labs  ?Lab 01/19/22 ?1701  ?INR 1.2  ? ?Cardiac Enzymes: ?Recent Labs  ?Lab 01/19/22 ?0929  ?CKTOTAL 205  ? ?BNP (last 3 results) ?No results for input(s): PROBNP in the last 8760 hours. ?HbA1C: ?No results for input(s): HGBA1C in the last 72 hours. ?CBG: ?Recent Labs  ?Lab 01/22/22 ?1148 01/22/22 ?1702 01/22/22 ?2131 01/23/22 ?0756 01/23/22 ?1203  ?GLUCAP 116* 118* 113* 146* 115*  ? ?Lipid Profile: ?No results for input(s): CHOL, HDL, LDLCALC, TRIG, CHOLHDL, LDLDIRECT in the last 72 hours. ?Thyroid Function Tests: ?No results for input(s): TSH, T4TOTAL, FREET4, T3FREE, THYROIDAB in the last 72 hours. ? ?Anemia Panel: ?No results for input(s): VITAMINB12, FOLATE, FERRITIN, TIBC, IRON, RETICCTPCT in the last 72 hours. ? ?Sepsis Labs: ?No results for input(s): PROCALCITON, LATICACIDVEN in the last 168 hours. ? ?No results found for this or any previous visit (from the past  240 hour(s)).  ? ? ? ? ? ?Radiology Studies: ?ECHOCARDIOGRAM COMPLETE ? ?Result Date: 01/22/2022 ?   ECHOCARDIOGRAM REPORT   Patient Name:   MELL MELLOTT Date of Exam: 01/22/2022 Medical Rec #:  [id number]      Height:       66.0 in Accession #:    [id number]     Weight:       170.0 lb Date of Birth:  04/25/1936     BSA:          1.866 m? Patient Age:    89 years       BP:           58/64 mmHg Patient Gender: F              HR:           67 bpm. Exam Location:  [facility] Procedure: 2D Echo, Cardiac Doppler and Color Doppler Indications:     R55 Syncope  History:         Patient has no prior history of Echocardiogram examinations.                  Arrythmias:Atrial Fibrillation; Risk Factors:Former Smoker,                  Diabetes, Hypertension and Dyslipidemia.  Sonographer:     Rosalia Hammers Referring Phys:  [id number] [place] Diagnosing Phys: Kathlyn Sacramento MD  Sonographer Comments: Image acquisition challenging due to uncooperative patient and Image acquisition challenging due to respiratory motion. IMPRESSIONS  1. Left ventricular ejection fraction, by estimation, is 55 to 60%. The left ventricle has normal function. The left ventricle has no regional wall motion abnormalities. There is mild left ventricular hypertrophy. Left ventricular diastolic parameters are indeterminate.  2. Right ventricular systolic function is normal. The right ventricular size is normal.  3. Left atrial size was mildly dilated.  4. The mitral valve is normal in structure. No evidence of mitral valve regurgitation. No evidence of mitral stenosis. Moderate mitral annular calcification.  5. The aortic valve is calcified. Aortic valve regurgitation is not visualized. Aortic valve sclerosis/calcification is present, without any evidence of aortic stenosis.  6. The inferior vena cava is normal in size with <50% respiratory variability, suggesting right atrial pressure of 8 mmHg. FINDINGS  Left Ventricle: Left ventricular ejection fraction, by  estimation, is 55 to 60%. The left ventricle has normal function. The left ventricle has no regional wall motion abnormalities. The left ventricular internal cavity size was normal in size. There is  mild left ventricular  hypertrophy. Left ventricular diastolic parameters are indeterminate. Right Ventricle: The right ventricular size is normal. No increase in right ventricular wall thickness. Right ventricular systolic function is normal. Left Atrium: Left atrial size was mildly dilated. Right Atrium: Right atrial size was normal in size. Pericardium: There is no evidence of pericardial effusion. Mitral Valve: The mitral valve is normal in structure. There is moderate thickening of the mitral valve leaflet(s). There is moderate calcification of the mitral valve leaflet(s). Moderate mitral annular calcification. No evidence of mitral valve regurgitation. No evidence of mitral valve stenosis. MV peak gradient, 7.4 mmHg. The mean mitral valve gradient is 2.0 mmHg. Tricuspid Valve: The tricuspid valve is normal in structure. Tricuspid valve regurgitation is trivial. No evidence of tricuspid stenosis. Aortic Valve: The aortic valve is calcified. Aortic valve regurgitation is not visualized. Aortic valve sclerosis/calcification is present, without any evidence of aortic stenosis. Aortic valve mean gradient measures 2.0 mmHg. Aortic valve peak gradient measures 4.2 mmHg. Aortic valve area, by VTI measures 3.62 cm?. Pulmonic Valve: The pulmonic valve was normal in structure. Pulmonic valve regurgitation is trivial. No evidence of pulmonic stenosis. Aorta: The aortic root is normal in size and structure. Venous: The inferior vena cava is normal in size with less than 50% respiratory variability, suggesting right atrial pressure of 8 mmHg. IAS/Shunts: No atrial level shunt detected by color flow Doppler.  LEFT VENTRICLE PLAX 2D LVIDd:         4.30 cm   Diastology LVIDs:         2.80 cm   LV e' medial:    8.59 cm/s LV PW:          1.30 cm   LV E/e' medial:  15.5 LV IVS:        1.10 cm   LV e' lateral:   11.40 cm/s LVOT diam:     2.00 cm   LV E/e' lateral: 11.7 LV SV:         80 LV SV Index:   43 LVOT Area:     3.14 cm?  RIGHT V

## 2022-01-23 NOTE — Progress Notes (Signed)
Family requesting cardiology and neurology consults.  ?

## 2022-01-23 NOTE — TOC Progression Note (Signed)
Transition of Care (TOC) - Progression Note  ? ? ?Patient Details  ?Name: Terry Kaiser ?MRN: [id number] ?Date of Birth: 13-Oct-1935 ? ?Transition of Care (TOC) CM/SW Contact  ?Beverly Sessions, RN ?Phone Number: ?01/23/2022, 10:57 AM ? ?Clinical Narrative:    ? ? ?Received call from patient's son requesting to see if [facility] is an option.  I notified him that they declined to offer at bed as they are not in network with [employer].  He confirms plans is to remain moving forward with [facility]  ?Expected Discharge Plan: [place] ?Barriers to Discharge: Continued Medical Work up ? ?Expected Discharge Plan and Services ?Expected Discharge Plan: [place] ?  ?  ?  ?Living arrangements for the past 2 months: [place] ?                ?  ?  ?  ?  ?  ?  ?  ?  ?  ?  ? ? ?Social Determinants of Health (SDOH) Interventions ?  ? ?Readmission Risk Interventions ? ?  01/20/2022  ? 12:26 PM  ?Readmission Risk Prevention Plan  ?Transportation Screening Complete  ?PCP or Specialist Appt within 5-7 Days Complete  ?Home Care Screening Complete  ?Medication Review (RN CM) Complete  ? ? ?

## 2022-01-23 NOTE — Evaluation (Signed)
Clinical/Bedside Swallow Evaluation ?Patient Details  ?Name: Terry Kaiser ?MRN: [id number] ?Date of Birth: 1936/09/03 ? ?Today's Date: 01/23/2022 ?Time: SLP Start Time (ACUTE ONLY): 4196 SLP Stop Time (ACUTE ONLY): 2229 ?SLP Time Calculation (min) (ACUTE ONLY): 40 min ? ?Past Medical History:  ?Past Medical History:  ?Diagnosis Date  ? Arthritis   ? joints  ? Borderline diabetes mellitus 06/22/15  ? A1c 6.1%; diet controlled  ? Cancer [facility])   ? skin  ? Colon polyps   ? Diabetes mellitus without complication ([place])   ? diet controlled, type 2  ? Diverticulosis   ? GERD (gastroesophageal reflux disease)   ? without esophagitis  ? HOH (hard of hearing)   ? aids  ? Hypertension   ? controlled on meds  ? Osteoporosis   ? Pure hypercholesterolemia   ? Seasonal allergies   ? ?Past Surgical History:  ?Past Surgical History:  ?Procedure Laterality Date  ? BREAST CYST ASPIRATION Left 1992  ? benign  ? BREAST EXCISIONAL BIOPSY Right 2000  ? benign  ? CARDIOVERSION N/A 03/22/2021  ? Procedure: CARDIOVERSION;  Surgeon: Corey Skains, MD;  Location: [facility] ORS;  Service: Cardiovascular;  Laterality: N/A;  ? CATARACT EXTRACTION W/PHACO Left 02/21/2015  ? Procedure: CATARACT EXTRACTION PHACO AND INTRAOCULAR LENS PLACEMENT (IOC);  Surgeon: Estill Cotta, MD;  Location: [facility] ORS;  Service: Ophthalmology;  Laterality: Left;   00:59 ?AP% 25.2 ?CDE 24.66  ? CATARACT EXTRACTION W/PHACO Right 02/26/2018  ? Procedure: CATARACT EXTRACTION PHACO AND INTRAOCULAR LENS PLACEMENT ([place])  DIABETIC RIGHT;  Surgeon: Leandrew Koyanagi, MD;  Location: [facility];  Service: Ophthalmology;  Laterality: Right;  Diabetes-diet controlled  ? COLONOSCOPY WITH PROPOFOL N/A 12/22/2015  ? Procedure: COLONOSCOPY WITH PROPOFOL;  Surgeon: Manya Silvas, MD;  Location: [facility] ENDOSCOPY;  Service: Endoscopy;  Laterality: N/A;  ? DILATION AND CURETTAGE OF UTERUS    ? ?HPI:  ?Pt is a 86 y.o. female with medical history significant for Cognitive  decline(Dementia dx per MD note), emphysema, GERD, mild aortic regurgitation, mitral valve regurgitation, persistent A-fib on Eliquis, type 2 diabetes controlled with diet, hypertension, chronic lower extremity edema, diverticulosis, chronic anemia, who presents from home after a fall. Immaging negative for acute fracture. Pt with large hematoma on R hip.  Per family report, pt has trouble findings words which has been ongoing for several months.  Pt has dx of Dementia without behavioral disturbance, per MD note.  MRI: No acute intracranial abnormality.  2. Moderate generalized atrophy and white matter disease likely  reflects the sequela of chronic microvascular ischemia.  3. Remote lacunar infarcts of the cerebellum bilaterally and thalami  bilaterally.  CXR: No acute cardiopulmonary disease.  ?  ?Assessment / Plan / Recommendation  ?Clinical Impression ? Pt seen at bedside today. She was easily distracted and needed verbal/visual cues for follow-through. Min HOH also. Pt appears to have(and reported by family) Cognitive decline(Dementia dx per MD note. ? ?Pt appears to present w/ adequate oropharyngeal phase swallow function in setting of declined Cognitive status; Baseline Dementia. Any decline in Cognitive functioning can impact overall awareness/timing of swallow and safety during po tasks which increases risk for aspiration, choking. Pt's risk for aspiration is reduced when following general aspiration precautions and supporting at meals for setup and prep as needed; reducing Distractions at meals also. She required Min-Mod verbal/visual cues for follow-through during po tasks.   ? ?Pt consumed several trials of ice chips, purees, moistened solids, and thin liquids vis cup and straw  w/ No overt, clinical s/s of aspiration noted during intake; no decline in vocal quality; no cough, and no decline in respiratory status during/post trials. O2 sats remained 97-99%. Oral phase was adequate for bolus management,  mastication, and oral clearing of the boluses given. Mastication of soft solids appropriate w/ moistened trials. Pt fed self given tray setup/prep and positioning support in bed. OM Exam appeared [facility] w/ No unilateral weakness noted. Some confusion of OM tasks and oral care.        ? ?D/t pt's Baseline, declined Cognitive status, recommend continuing current diet of Regular consistency (cut meats, foods for ease) w/ Thin liquids; general aspiration precautions; reduce Distractions during meals and engage pt during meal for self-feeding. Support w/ tray setup and positioning at meals. Pills Whole vs Crushed in Puree for safer, easier swallowing in setting of Cognitive decline as needed. Dietician f/u for support as needed. MD/NSG updated. ?SLP Visit Diagnosis: Dysphagia, unspecified (R13.10) ?   ?Aspiration Risk ?  (reduced following general precautions)  ?  ?Diet Recommendation   Regular consistency (cut meats, foods for ease) w/ Thin liquids; general aspiration precautions; reduce Distractions during meals and engage pt for self-feeding at meals. Support w/ tray setup and positioning at meals.  ? ?Medication Administration: Whole meds with puree (as needed if any difficulty swallowing w/ liquids noted)  ?  ?Other  Recommendations Recommended Consults:  (Dietician f/u for support) ?Oral Care Recommendations: Oral care BID;Oral care before and after PO;Patient independent with oral care (setup support) ?Other Recommendations:  (n/a)   ? ?Recommendations for follow up therapy are one component of a multi-disciplinary discharge planning process, led by the attending physician.  Recommendations may be updated based on patient status, additional functional criteria and insurance authorization. ? ?Follow up Recommendations No SLP follow up  ? ? ?  ?Assistance Recommended at Discharge Intermittent Supervision/Assistance (baseline Cognitive decline)  ?Functional Status Assessment Patient has not had a recent decline in their  functional status  ?Frequency and Duration  (n/a)  ? (n/a) ?  ?   ? ?Prognosis Prognosis for Safe Diet Advancement: Good ?Barriers to Reach Goals: Cognitive deficits;Time post onset;Severity of deficits  ? ?  ? ?Swallow Study   ?General Date of Onset: 01/23/22 ?HPI: Pt is a 86 y.o. female with medical history significant for emphysema, mild aortic regurgitation, mitral valve regurgitation, persistent A-fib on Eliquis, type 2 diabetes controlled with diet, hypertension, chronic lower extremity edema, diverticulosis, chronic anemia, who presents from home after a fall. Immaging negative for acute fracture. Pt with large hematoma on R hip.  Per family report, pt has trouble findings words which has been ongoing for several months.  Pt has dx of Dementia without behavioral disturbance, per MD note.  MRI: No acute intracranial abnormality.  2. Moderate generalized atrophy and white matter disease likely  reflects the sequela of chronic microvascular ischemia.  3. Remote lacunar infarcts of the cerebellum bilaterally and thalami  bilaterally.  CXR: No acute cardiopulmonary disease. ?Type of Study: Bedside Swallow Evaluation ?Previous Swallow Assessment: none ?Diet Prior to this Study: Regular;Thin liquids ?Temperature Spikes Noted: No (wbc 6.4) ?Respiratory Status: Room air ?History of Recent Intubation: No ?Behavior/Cognition: Alert;Cooperative;Pleasant mood;Confused;Requires cueing;Distractible (min HOH) ?Oral Cavity Assessment: Within Functional Limits ?Oral Care Completed by SLP: Yes ?Oral Cavity - Dentition: Adequate natural dentition ?Vision: Functional for self-feeding ?Self-Feeding Abilities: Able to feed self;Needs assist;Needs set up (encouragement) ?Patient Positioning: Upright in bed (needed positioning support w/ pillows) ?Baseline Vocal Quality: Normal ?Volitional Cough: Strong ?  Volitional Swallow: Able to elicit  ?  ?Oral/Motor/Sensory Function Overall Oral Motor/Sensory Function: Within functional limits    ?Ice Chips Ice chips: Within functional limits ?Presentation: Spoon (fed; 2 trial)   ?Thin Liquid Thin Liquid: Within functional limits ?Presentation: Self Fed;Straw;Cup (2 trials via cup; 10 trials via straw)

## 2022-01-24 ENCOUNTER — Inpatient Hospital Stay: Payer: Medicare HMO

## 2022-01-24 DIAGNOSIS — T148XXA Other injury of unspecified body region, initial encounter: Secondary | ICD-10-CM

## 2022-01-24 DIAGNOSIS — R55 Syncope and collapse: Secondary | ICD-10-CM | POA: Diagnosis not present

## 2022-01-24 DIAGNOSIS — W19XXXD Unspecified fall, subsequent encounter: Secondary | ICD-10-CM | POA: Diagnosis not present

## 2022-01-24 DIAGNOSIS — D649 Anemia, unspecified: Secondary | ICD-10-CM | POA: Diagnosis not present

## 2022-01-24 LAB — BASIC METABOLIC PANEL
Anion gap: 6 (ref 5–15)
BUN: 17 mg/dL (ref 8–23)
CO2: 26 mmol/L (ref 22–32)
Calcium: 8.1 mg/dL — ABNORMAL LOW (ref 8.9–10.3)
Chloride: 103 mmol/L (ref 98–111)
Creatinine, Ser: 0.66 mg/dL (ref 0.44–1.00)
GFR, Estimated: >60 mL/min (ref >60)
Glucose, Bld: 126 mg/dL — ABNORMAL HIGH (ref 70–99)
Potassium: 3.1 mmol/L — ABNORMAL LOW (ref 3.5–5.1)
Sodium: 135 mmol/L (ref 135–145)

## 2022-01-24 LAB — GLUCOSE, CAPILLARY
Glucose-Capillary: 120 mg/dL — ABNORMAL HIGH (ref 70–99)
Glucose-Capillary: 124 mg/dL — ABNORMAL HIGH (ref 70–99)
Glucose-Capillary: 107 mg/dL — ABNORMAL HIGH (ref 70–99)
Glucose-Capillary: 162 mg/dL — ABNORMAL HIGH (ref 70–99)

## 2022-01-24 LAB — CBC
HCT: 25.1 % — ABNORMAL LOW (ref 36.0–46.0)
Hemoglobin: 8.4 g/dL — ABNORMAL LOW (ref 12.0–15.0)
MCH: 30.1 pg (ref 26.0–34.0)
MCHC: 33.5 g/dL (ref 30.0–36.0)
MCV: 90 fL (ref 80.0–100.0)
Platelets: 164 10*3/uL (ref 150–400)
RBC: 2.79 MIL/uL — ABNORMAL LOW (ref 3.87–5.11)
RDW: 13.2 % (ref 11.5–15.5)
WBC: 5.4 10*3/uL (ref 4.0–10.5)
nRBC: 0 % (ref 0.0–0.2)

## 2022-01-24 IMAGING — US US CAROTID DUPLEX BILAT
1 series · 13 of 24 positions shown · non-contrast
Comparison: None.

CLINICAL DATA: 85-year-old female with a history of syncope

EXAM:
BILATERAL CAROTID DUPLEX ULTRASOUND
TECHNIQUE: Gray scale imaging, color Doppler and duplex ultrasound were
performed of bilateral carotid and vertebral arteries in the neck.

[Series 1: us carotid bilateral · 13 of 67 slices shown]
[im 1/67]
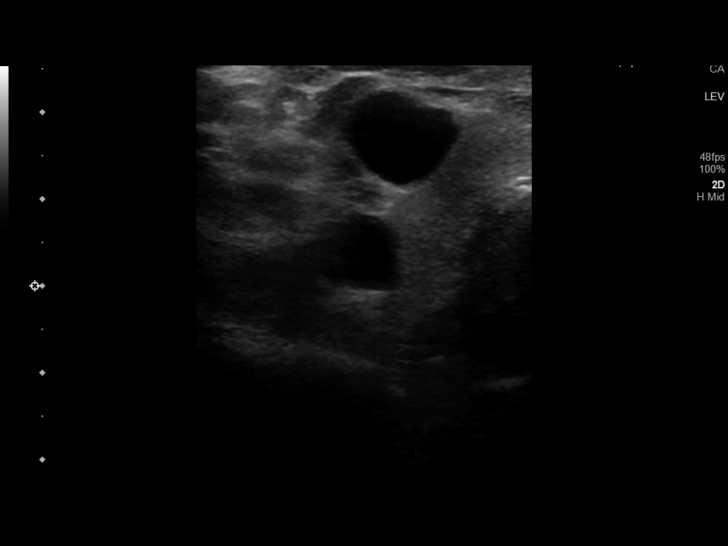
[im 6/67]
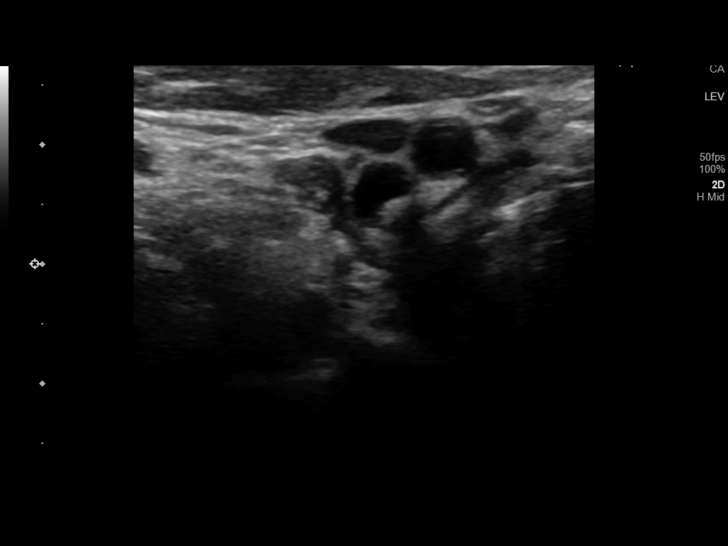
[im 12/67]
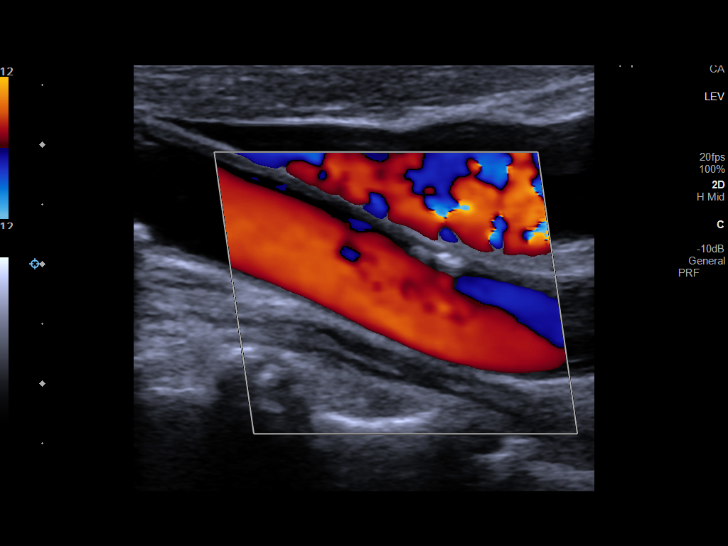
[im 18/67]
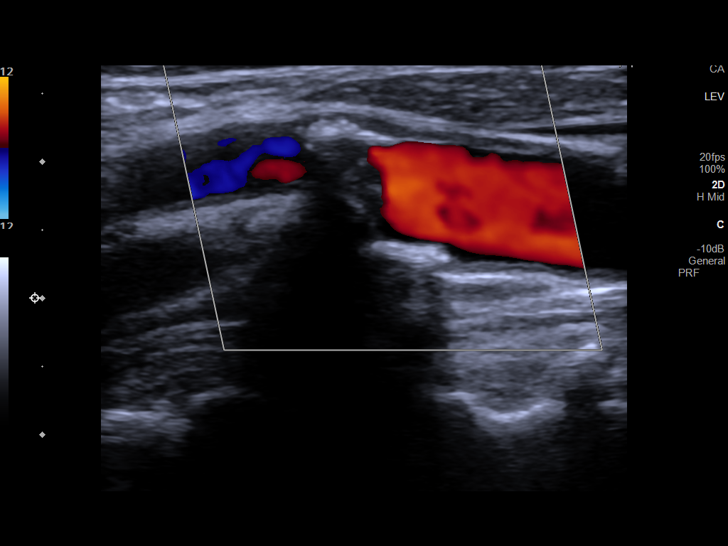
[im 23/67]
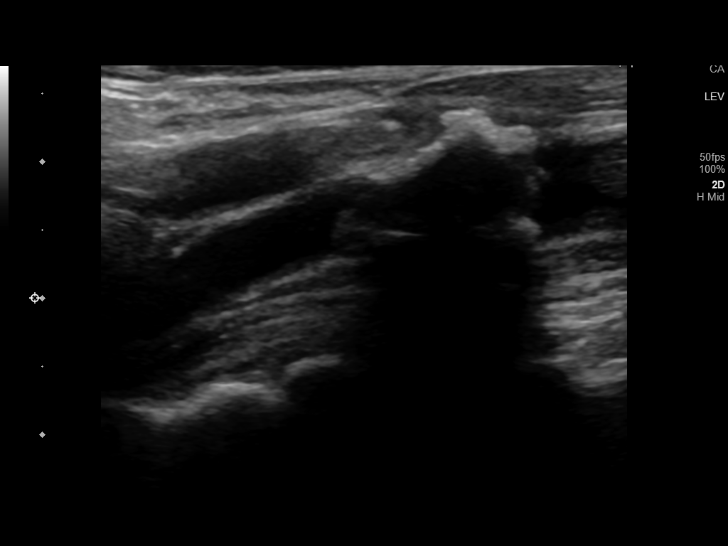
[im 29/67]
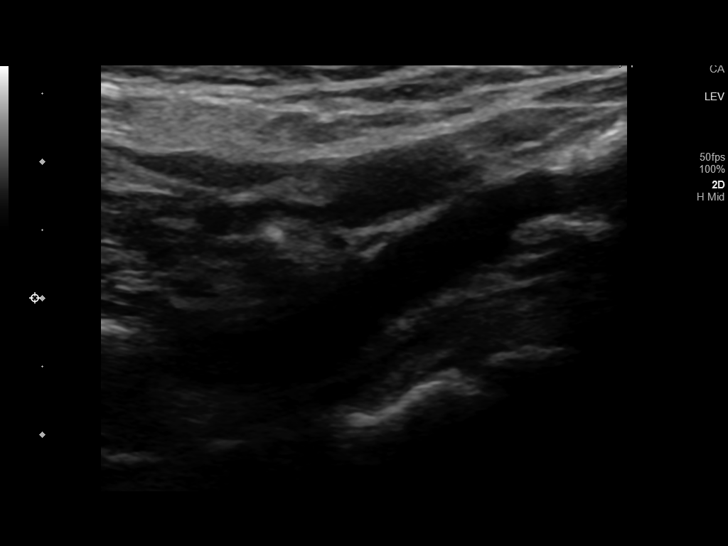
[im 35/67]
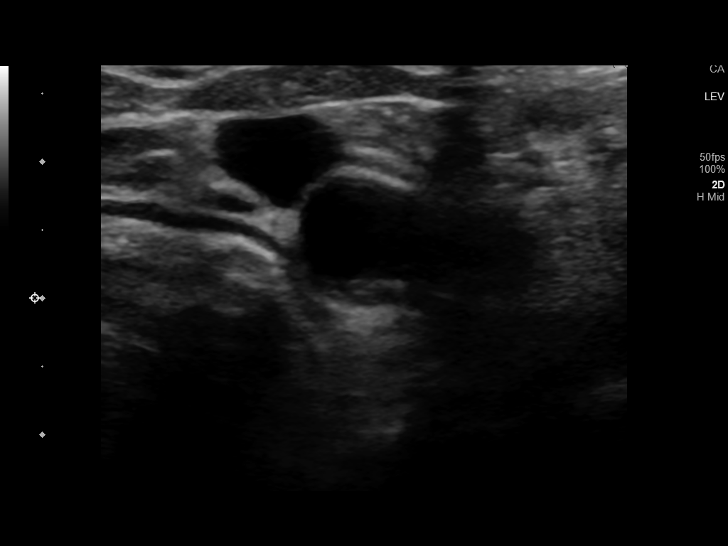
[im 38/67]
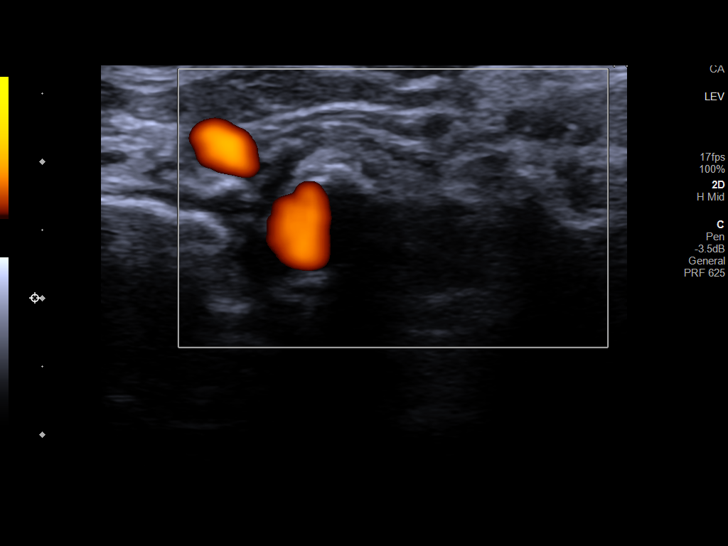
[im 44/67]
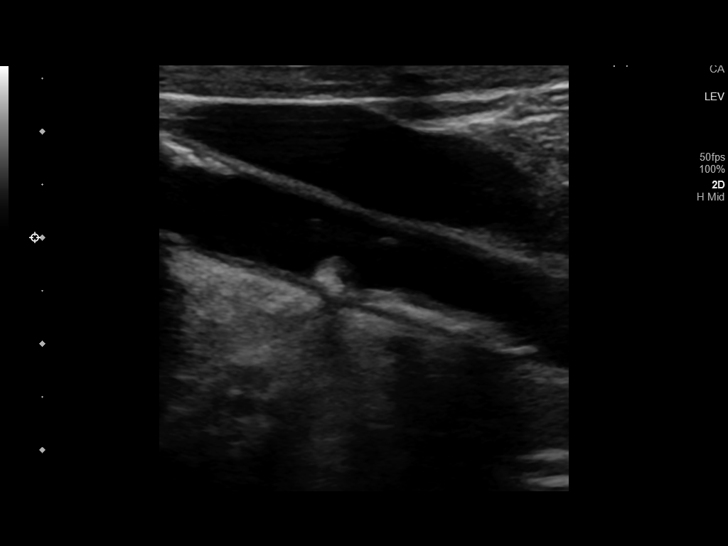
[im 49/67]
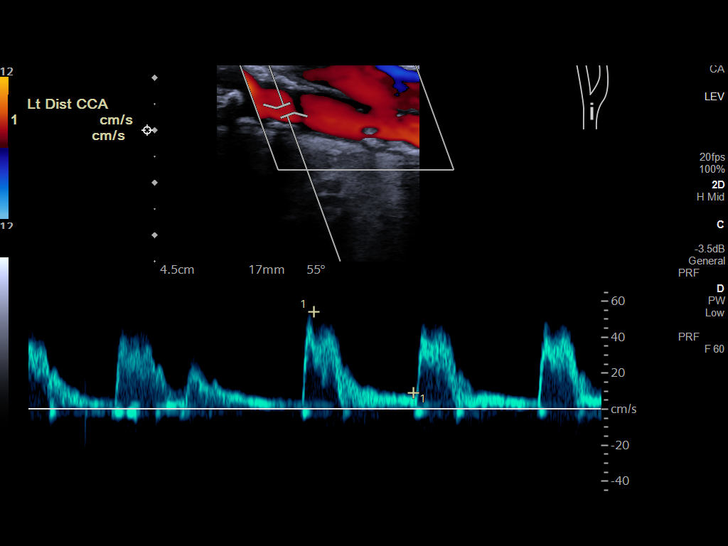
[im 55/67]
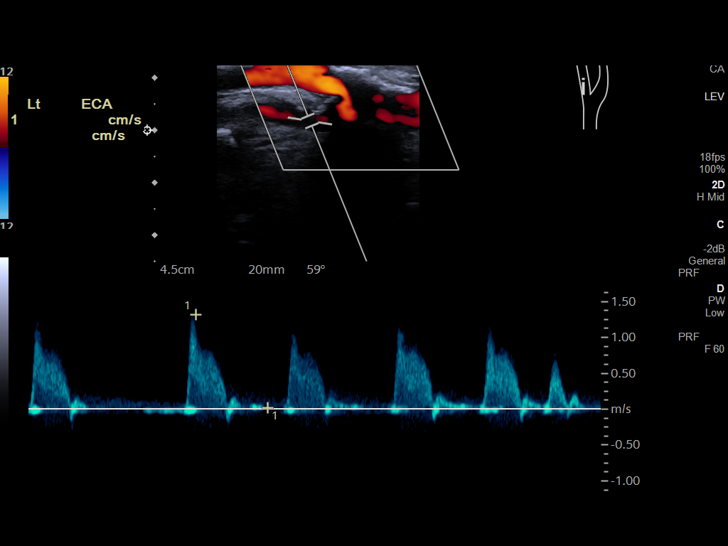
[im 61/67]
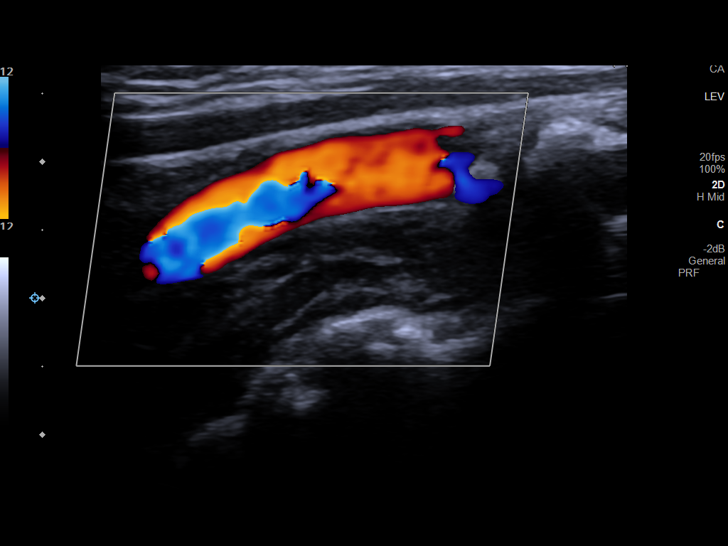
[im 67/67]
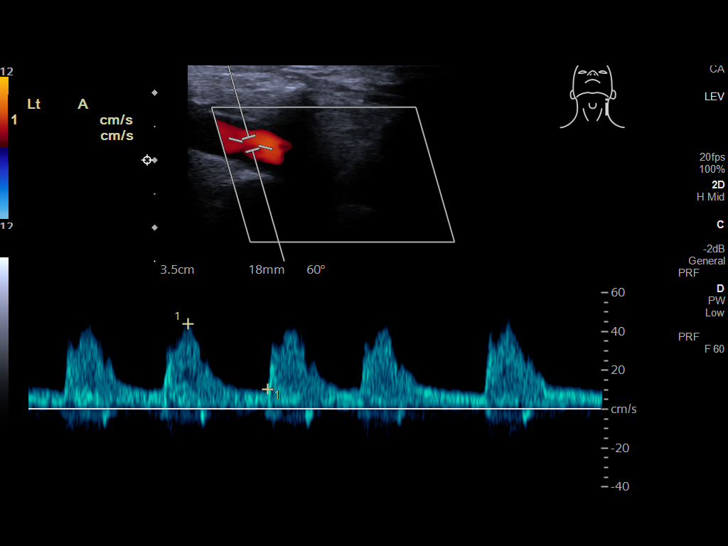

[13 of 24 positions shown; findings below may reference images not displayed]

FINDINGS: Criteria: Quantification of carotid stenosis is based on velocity
parameters that correlate the residual internal carotid diameter
with NASCET-based stenosis levels, using the diameter of the distal
internal carotid lumen as the denominator for stenosis measurement.

The following velocity measurements were obtained:

RIGHT

ICA:  Systolic 84 cm/sec, Diastolic 16 cm/sec

CCA:  44 cm/sec

SYSTOLIC ICA/CCA RATIO:

ECA:  188 cm/sec

LEFT

ICA:  Systolic 110 cm/sec, Diastolic 20 cm/sec

CCA:  54 cm/sec

SYSTOLIC ICA/CCA RATIO:

ECA:  132 cm/sec

Right Brachial SBP: Not acquired

Left Brachial SBP: Not acquired

RIGHT CAROTID ARTERY: No significant calcifications of the right
common carotid artery. Intermediate waveform maintained. Moderate
heterogeneous and partially calcified plaque at the right carotid
bifurcation. Lumen shadowing present. Low resistance waveform of the
right ICA. Tortuosity

RIGHT VERTEBRAL ARTERY: Antegrade flow with low resistance waveform.

LEFT CAROTID ARTERY: No significant calcifications of the left
common carotid artery. Intermediate waveform maintained. Moderate
heterogeneous and partially calcified plaque at the left carotid
bifurcation. Lumen shadowing present. Low resistance waveform of the
left ICA. Tortuosity

LEFT VERTEBRAL ARTERY:  Antegrade flow with low resistance waveform.
IMPRESSION: Color duplex indicates moderate heterogeneous and calcified plaque,
with no hemodynamically significant stenosis by duplex criteria in
the extracranial cerebrovascular circulation, however, flow
velocities of the bilateral ICA were obtained from an area distal to
the maximum narrowing due to the presence of anterior wall plaque
with shadowing and may be underestimating the percentage of ICA
stenosis. If establishing a more accurate degree of stenosis is
required, cerebral angiogram should be considered, or as a second
best test, CTA.

## 2022-01-24 MED ORDER — POTASSIUM CHLORIDE CRYS ER 20 MEQ PO TBCR
40.0000 meq | EXTENDED_RELEASE_TABLET | Freq: Once | ORAL | Status: AC
  Administered 2022-01-24: 40 meq via ORAL
  Filled 2022-01-24: qty 2

## 2022-01-24 NOTE — Progress Notes (Signed)
Mobility Specialist - Progress Note ? ? 01/24/22 1200  ?Mobility  ?Activity Refused mobility  ? ? ? ?Pt declined mobility, no reason specified. Pt reports just finishing short ambulation this date with staff and requesting to nap this afternoon. Pt declined bed-chair transfer.  ? ? ?Kathee Delton ?Mobility Specialist ?01/24/22, 12:54 PM ? ? ? ? ?

## 2022-01-24 NOTE — Consult Note (Signed)
?CARDIOLOGY CONSULT NOTE  ? ?   ?    ?  ? ? ? ?Patient ID: ?SAMELLA LUCCHETTI ?MRN: [id number] ?DOB/AGE: Jan 27, 1936 86 y.o. ? ?Admit date: 01/19/2022 ?Referring Physician Dr Ralene Muskrat hospitalist ?Primary Physician Dr. Netty Starring primary ?Primary Cardiologist Dr. Nehemiah Massed cardiologist ?Reason for Consultation atrial fibrillation hematoma after fall ? ?HPI: Patient is a 86 year old female multiple medical problems we will regurgitation MR atrial fibrillation on Eliquis diabetes diabetes hypertension lower extremity edema chronic anemia presents after a fall at home suffering a large hematoma of her right hip and thigh with significant bruising and tenderness and ecchymosis.  Patient was found to have a 8 x 3 x 8 cm large tender bruise and tender.  Patient denies any head injury now here for follow-up evaluation neurologic contusion and bruise and hematoma ? ?Review of systems complete and found to be negative unless listed above  ? ? ? ?Past Medical History:  ?Diagnosis Date  ? Arthritis   ? joints  ? Borderline diabetes mellitus 06/22/15  ? A1c 6.1%; diet controlled  ? Cancer [facility])   ? skin  ? Colon polyps   ? Diabetes mellitus without complication ([place])   ? diet controlled, type 2  ? Diverticulosis   ? GERD (gastroesophageal reflux disease)   ? without esophagitis  ? HOH (hard of hearing)   ? aids  ? Hypertension   ? controlled on meds  ? Osteoporosis   ? Pure hypercholesterolemia   ? Seasonal allergies   ?  ?Past Surgical History:  ?Procedure Laterality Date  ? BREAST CYST ASPIRATION Left 1992  ? benign  ? BREAST EXCISIONAL BIOPSY Right 2000  ? benign  ? CARDIOVERSION N/A 03/22/2021  ? Procedure: CARDIOVERSION;  Surgeon: Corey Skains, MD;  Location: [facility] ORS;  Service: Cardiovascular;  Laterality: N/A;  ? CATARACT EXTRACTION W/PHACO Left 02/21/2015  ? Procedure: CATARACT EXTRACTION PHACO AND INTRAOCULAR LENS PLACEMENT (IOC);  Surgeon: Estill Cotta, MD;  Location: [facility] ORS;  Service: Ophthalmology;   Laterality: Left;   00:59 ?AP% 25.2 ?CDE 24.66  ? CATARACT EXTRACTION W/PHACO Right 02/26/2018  ? Procedure: CATARACT EXTRACTION PHACO AND INTRAOCULAR LENS PLACEMENT ([place])  DIABETIC RIGHT;  Surgeon: Leandrew Koyanagi, MD;  Location: [facility];  Service: Ophthalmology;  Laterality: Right;  Diabetes-diet controlled  ? COLONOSCOPY WITH PROPOFOL N/A 12/22/2015  ? Procedure: COLONOSCOPY WITH PROPOFOL;  Surgeon: Manya Silvas, MD;  Location: [facility] ENDOSCOPY;  Service: Endoscopy;  Laterality: N/A;  ? DILATION AND CURETTAGE OF UTERUS    ?  ?Medications Prior to Admission  ?Medication Sig Dispense Refill Last Dose  ? alendronate (FOSAMAX) 70 MG tablet Take 70 mg by mouth once a week. Take with a full glass of water on an empty stomach.   Past Week  ? amiodarone (PACERONE) 200 MG tablet Take 200 mg by mouth 2 (two) times daily.   01/18/2022  ? amLODipine (NORVASC) 10 MG tablet Take 10 mg by mouth daily.   01/18/2022  ? apixaban (ELIQUIS) 5 MG TABS tablet Take 5 mg by mouth 2 (two) times daily.   01/18/2022  ? Calcium Carb-Cholecalciferol 600-400 MG-UNIT TABS Take 1 tablet by mouth 2 (two) times daily.   01/18/2022  ? enalapril (VASOTEC) 20 MG tablet Take 20 mg by mouth daily.   01/18/2022  ? escitalopram (LEXAPRO) 5 MG tablet Take 5 mg by mouth daily.   01/18/2022  ? hydrochlorothiazide (HYDRODIURIL) 25 MG tablet Take 25 mg by mouth daily. am   01/18/2022  ?  lovastatin (MEVACOR) 40 MG tablet Take 40 mg by mouth at bedtime.   01/18/2022  ? metoprolol succinate (TOPROL-XL) 25 MG 24 hr tablet Take 25 mg by mouth 2 (two) times daily. Take with or immediately following a meal.   01/18/2022  ? pantoprazole (PROTONIX) 20 MG tablet Take 20 mg by mouth daily. dinner   01/18/2022  ? potassium chloride (K-DUR,KLOR-CON) 10 MEQ tablet Take 20 mEq by mouth 2 (two) times daily.    01/18/2022  ? vitamin B-12 (CYANOCOBALAMIN) 500 MCG tablet Take 500 mcg by mouth daily.   01/18/2022  ? vitamin C (ASCORBIC ACID) 500 MG tablet Take 500 mg by  mouth 2 (two) times daily.    01/18/2022  ? Cinnamon 500 MG TABS Take 500 mg by mouth 2 (two) times daily. (Patient not taking: Reported on 01/19/2022)   Not Taking  ? vitamin E 400 UNIT capsule Take 400 Units by mouth daily. (Patient not taking: Reported on 01/19/2022)   Not Taking  ? ?Social History  ? ?Socioeconomic History  ? Marital status: Married  ?  Spouse name: Not on file  ? Number of children: Not on file  ? Years of education: Not on file  ? Highest education level: Not on file  ?Occupational History  ? Not on file  ?Tobacco Use  ? Smoking status: Former  ? Smokeless tobacco: Never  ?Vaping Use  ? Vaping Use: Never used  ?Substance and Sexual Activity  ? Alcohol use: No  ? Drug use: No  ? Sexual activity: Not Currently  ?  Birth control/protection: Post-menopausal  ?Other Topics Concern  ? Not on file  ?Social History Narrative  ? Not on file  ? ?Social Determinants of Health  ? ?Financial Resource Strain: Not on file  ?Food Insecurity: Not on file  ?Transportation Needs: Not on file  ?Physical Activity: Not on file  ?Stress: Not on file  ?Social Connections: Not on file  ?Intimate Partner Violence: Not on file  ?  ?Family History  ?Problem Relation Age of Onset  ? Breast cancer Maternal Aunt 60  ?  ? ? ?Review of systems complete and found to be negative unless listed above  ? ? ? ? ?PHYSICAL EXAM ? ?General: Well developed, well nourished, in no acute distress ?HEENT:  Normocephalic and atramatic ?Neck:  No JVD.  ?Lungs: Clear bilaterally to auscultation and percussion. ?Heart: Irregularly irregular. Normal S1 and S2 without gallops or murmurs.  ?Abdomen: Bowel sounds are positive, abdomen soft and non-tender  ?Msk:  Back normal, normal gait. Normal strength and tone for age. ?Extremities: No clubbing, cyanosis or edema.  Large hematoma bruising of the right hip and thigh ?Neuro: Alert and oriented X 3. ?Psych:  Good affect, responds appropriately ? ?Labs: ?  ?Lab Results  ?Component Value Date  ? WBC 5.4  01/24/2022  ? HGB 8.4 (L) 01/24/2022  ? HCT 25.1 (L) 01/24/2022  ? MCV 90.0 01/24/2022  ? PLT 164 01/24/2022  ?  ?Recent Labs  ?Lab 01/19/22 ?0929 01/20/22 ?0927 01/24/22 ?3810  ?NA 140  --  135  ?K 3.1*   < > 3.1*  ?CL 104  --  103  ?CO2 27  --  26  ?BUN 31*  --  17  ?CREATININE 0.86  --  0.66  ?CALCIUM 8.7*  --  8.1*  ?PROT 6.3*  --   --   ?BILITOT 1.1  --   --   ?ALKPHOS 42  --   --   ?  ALT 17  --   --   ?AST 26  --   --   ?GLUCOSE 204*  --  126*  ? < > = values in this interval not displayed.  ? ?Lab Results  ?Component Value Date  ? CKTOTAL 205 01/19/2022  ? No results found for: CHOL ?No results found for: HDL ?No results found for: [place] ?No results found for: TRIG ?No results found for: CHOLHDL ?No results found for: LDLDIRECT  ?  ?Radiology: CT Head Wo Contrast ? ?Result Date: 01/19/2022 ?CLINICAL DATA:  The fall Head trauma EXAM: CT HEAD WITHOUT CONTRAST TECHNIQUE: Contiguous axial images were obtained from the base of the skull through the vertex without intravenous contrast. RADIATION DOSE REDUCTION: This exam was performed according to the departmental dose-optimization program which includes automated exposure control, adjustment of the mA and/or kV according to patient size and/or use of iterative reconstruction technique. COMPARISON:  None. FINDINGS: Brain: No evidence of acute infarction, hemorrhage, hydrocephalus, extra-axial collection or mass lesion/mass effect. Diffuse cortical atrophy. Periventricular white matter hypodensity is a nonspecific finding, but most commonly relates to chronic ischemic small vessel disease. Focal hypodensity in the left basal ganglia is likely a prominent space Virchow-Robin. Vascular: No hyperdense vessel or unexpected calcification. Skull: Normal. Negative for fracture or focal lesion. Sinuses/Orbits: No acute finding. Other: None. IMPRESSION: No acute intracranial abnormality. Electronically Signed   By: Miachel Roux M.D.   On: 01/19/2022 10:24  ? ?CT Cervical  Spine Wo Contrast ? ?Result Date: 01/19/2022 ?CLINICAL DATA:  Fall EXAM: CT CERVICAL SPINE WITHOUT CONTRAST TECHNIQUE: Multidetector CT imaging of the cervical spine was performed without intravenous contrast.

## 2022-01-24 NOTE — Progress Notes (Signed)
Occupational Therapy Treatment ?Patient Details ?Name: Terry Kaiser ?MRN: [id number] ?DOB: 01-30-1936 ?Today's Date: 01/24/2022 ? ? ?History of present illness Pt is an 86 y.o. female presenting to hospital 4/14 s/p fall.  Worsening dementia/progressive decline noted (pt has been having memory issues and wording finding difficulty).  Imaging showing "severe hemorrhagic soft tissue contusion involving the subcutaneous fat overlying the right hip with a 8.2 x 3.4 x 8.7 cm hematoma."  Pt admitted with R hip contusion s/p fall (significant hematoma), worsening dementia, and hypokalemia.  PMH includes emphysema, mild aortic regurgitation, MV regurgitation, persistent a-fib on Eliquis, DM, htn, chronic LE edema, diverticulosis, chronic anemia. ?  ?OT comments ? Pt seen for OT treatment on this date. Upon arrival to room, pt awake and seated upright in bed. Pt agreeable and motivated to participate in OT tx. Pt required only SUPERVISION for bed mobility. Once seated EOB, pt demonstrated FAIR sitting balance and was able to don/doff socks requiring only SUPERVISION d/t decreased dynamic sitting balance. Pt performed sit>stand transfer, requiring only SUPERVISION this date. This author attempted to obtain orthostatic vitals while standing at 3 mins, however pt only able to tolerate static standing for 2 mins before reporting fatigue and requiring seated rest break. Following rest break, pt walked 44f with RW and MIN GUARD d/t decreased strength and activity tolerance. Pt denied dizziness throughout, although endorsed significant fatigue with minimal activity. Pt is making good progress toward goals and continues to benefit from skilled OT services to maximize return to PLOF and minimize risk of future falls, injury, caregiver burden, and readmission. Will continue to follow POC. Discharge recommendation remains appropriate.   ? ?Orthostatic VS for the past 24 hrs (Last 3 readings): ? BP- Lying Pulse- Lying BP- Sitting Pulse-  Sitting BP- Standing at 0 minutes Pulse- Standing at 0 minutes  ?01/24/22 1100 127/60 79 105/56 95 104/50 86  ?   ? ?Recommendations for follow up therapy are one component of a multi-disciplinary discharge planning process, led by the attending physician.  Recommendations may be updated based on patient status, additional functional criteria and insurance authorization. ?   ?Follow Up Recommendations ? Skilled nursing-short term rehab (<3 hours/day)  ?  ?Assistance Recommended at Discharge Frequent or constant Supervision/Assistance  ?Patient can return home with the following ? A little help with walking and/or transfers;A little help with bathing/dressing/bathroom;Assistance with cooking/housework;Assist for transportation ?  ?Equipment Recommendations ? BSC/3in1  ?  ?   ?Precautions / Restrictions Precautions ?Precautions: Fall ?Precaution Comments: Orthostatic ?Restrictions ?Weight Bearing Restrictions: No  ? ? ?  ? ?Mobility Bed Mobility ?Overal bed mobility: Needs Assistance ?Bed Mobility: Supine to Sit, Sit to Supine ?  ?  ?Supine to sit: Supervision, HOB elevated ?Sit to supine: Supervision ?  ?  ?  ? ?Transfers ?Overall transfer level: Needs assistance ?Equipment used: Rolling walker (2 wheels) ?Transfers: Sit to/from Stand ?Sit to Stand: Supervision ?  ?  ?  ?  ?  ?  ?  ?  ?Balance Overall balance assessment: Needs assistance ?Sitting-balance support: No upper extremity supported, Feet supported ?Sitting balance-Leahy Scale: Good ?Sitting balance - Comments: steady sitting reaching within BOS ?  ?Standing balance support: Single extremity supported, During functional activity ?Standing balance-Leahy Scale: Fair ?Standing balance comment: Requires only MIN GUARD to walk 854fwith RW ?  ?  ?  ?  ?  ?  ?  ?  ?  ?  ?  ?   ? ?ADL either performed or assessed with  clinical judgement  ? ?ADL Overall ADL's : Needs assistance/impaired ?  ?  ?  ?  ?  ?  ?  ?  ?  ?  ?  ?  ?  ?  ?  ?  ?  ?  ?  ?General ADL Comments:  SUPERVISION/SET-UP for seated UB ADLs and seated LB dressing. Requires MIN GUARD to walk 26f with RW ?  ? ? ? ?Cognition Arousal/Alertness: Awake/alert ?Behavior During Therapy: [facility] tasks assessed/performed ?Overall Cognitive Status: History of cognitive impairments - at baseline ?  ?  ?  ?  ?  ?  ?  ?  ?  ?  ?  ?  ?  ?  ?  ?  ?General Comments: Difficult to assess 2/2 pt being HOH and having baseline difficutly with word finding, however able to demonstrate that she was oriented to self, place, month/year, and parts of situation. ?  ?  ?   ?   ?   ?   ? ? ?Pertinent Vitals/ Pain       Pain Assessment ?Pain Assessment: No/denies pain ? ?   ?   ? ?Frequency ? Min 2X/week  ? ? ? ? ?  ?Progress Toward Goals ? ?OT Goals(current goals can now be found in the care plan section) ? Progress towards OT goals: Progressing toward goals ? ?Acute Rehab OT Goals ?Patient Stated Goal: to walk further ?OT Goal Formulation: With patient ?Time For Goal Achievement: 02/02/22 ?Potential to Achieve Goals: Good  ?Plan Discharge plan remains appropriate;Frequency remains appropriate   ? ?   ?AM-PAC OT "6 Clicks" Daily Activity     ?Outcome Measure ? ? Help from another person eating meals?: None ?Help from another person taking care of personal grooming?: A Little ?Help from another person toileting, which includes using toliet, bedpan, or urinal?: A Lot ?Help from another person bathing (including washing, rinsing, drying)?: A Lot ?Help from another person to put on and taking off regular upper body clothing?: A Little ?Help from another person to put on and taking off regular lower body clothing?: A Little ?6 Click Score: 17 ? ?  ?End of Session Equipment Utilized During Treatment: Rolling walker (2 wheels) ? ?OT Visit Diagnosis: Other abnormalities of gait and mobility (R26.89);Muscle weakness (generalized) (M62.81) ?  ?Activity Tolerance Patient tolerated treatment well ?  ?Patient Left in bed;with call bell/phone within  reach;with bed alarm set ?  ?Nurse Communication Mobility status ?  ? ?   ? ?Time: [address]44?OT Time Calculation (min): 27 min ? ?Charges: OT General Charges ?$OT Visit: 1 Visit ?OT Treatments ?$Self Care/Home Management : 23-37 mins ? ?LFredirick Maudlin OTR/L ?AOnward? ?

## 2022-01-24 NOTE — Progress Notes (Signed)
Physical Therapy Treatment ?Patient Details ?Name: Terry Kaiser ?MRN: [id number] ?DOB: Jul 06, 1936 ?Today's Date: 01/24/2022 ? ? ?History of Present Illness Pt is an 86 y.o. female presenting to hospital 4/14 s/p fall.  Worsening dementia/progressive decline noted (pt has been having memory issues and wording finding difficulty).  Imaging showing "severe hemorrhagic soft tissue contusion involving the subcutaneous fat overlying the right hip with a 8.2 x 3.4 x 8.7 cm hematoma."  Pt admitted with R hip contusion s/p fall (significant hematoma), worsening dementia, and hypokalemia.  PMH includes emphysema, mild aortic regurgitation, MV regurgitation, persistent a-fib on Eliquis, DM, htn, chronic LE edema, diverticulosis, chronic anemia. ? ?  ?PT Comments  ? ? Pt was pleasant and motivated to participate during the session and put forth good effort throughout. Pt's orthostatic vitals taken recently by OT this date with pt able to stand and ambulate with OT without adverse symptoms.  Pt reported no adverse symptoms during this session and was able to amb 10 feet and then a max of 30 feet with slow cadence but without any noted instability with fatigue being the limiting factor. Pt's SpO2 and HR were both WNL on room air during the session.  Pt will benefit from PT services in a SNF setting upon discharge to safely address deficits listed in patient problem list for decreased caregiver assistance and eventual return to PLOF. ? ?   ?Recommendations for follow up therapy are one component of a multi-disciplinary discharge planning process, led by the attending physician.  Recommendations may be updated based on patient status, additional functional criteria and insurance authorization. ? ?Follow Up Recommendations ? Skilled nursing-short term rehab (<3 hours/day) ?  ?  ?Assistance Recommended at Discharge Frequent or constant Supervision/Assistance  ?Patient can return home with the following A lot of help with walking  and/or transfers;A lot of help with bathing/dressing/bathroom;Assistance with cooking/housework;Direct supervision/assist for medications management;Assist for transportation ?  ?Equipment Recommendations ? Rolling walker (2 wheels);BSC/3in1  ?  ?Recommendations for Other Services   ? ? ?  ?Precautions / Restrictions Precautions ?Precautions: Fall ?Precaution Comments: Orthostatic ?Restrictions ?Weight Bearing Restrictions: No  ?  ? ?Mobility ? Bed Mobility ?Overal bed mobility: Modified Independent ?  ?  ?  ?  ?  ?  ?General bed mobility comments: Min extra time and effort only with sup to sit ?  ? ?Transfers ?Overall transfer level: Needs assistance ?Equipment used: Rolling walker (2 wheels) ?Transfers: Sit to/from Stand ?Sit to Stand: Min guard ?  ?  ?  ?  ?  ?General transfer comment: Mod verbal and tactile cues for hand placement ?  ? ?Ambulation/Gait ?Ambulation/Gait assistance: Min guard ?Gait Distance (Feet): 30 Feet x 1, 10 Feet x 1 ?Assistive device: Rolling walker (2 wheels) ?Gait Pattern/deviations: Step-through pattern, Decreased step length - right, Decreased step length - left, Trunk flexed ?Gait velocity: decreased ?  ?  ?General Gait Details: Slow cadence with short B step length but steady without LOB or adverse symptoms ? ? ?Stairs ?  ?  ?  ?  ?  ? ? ?Wheelchair Mobility ?  ? ?Modified Rankin (Stroke Patients Only) ?  ? ? ?  ?Balance Overall balance assessment: Needs assistance ?Sitting-balance support: No upper extremity supported, Feet supported ?Sitting balance-Leahy Scale: Good ?  ?  ?Standing balance support: Bilateral upper extremity supported, During functional activity ?Standing balance-Leahy Scale: Fair ?  ?  ?  ?  ?  ?  ?  ?  ?  ?  ?  ?  ?  ? ?  ?  Cognition Arousal/Alertness: Awake/alert ?Behavior During Therapy: [facility] for tasks assessed/performed ?Overall Cognitive Status: History of cognitive impairments - at baseline ?  ?  ?  ?  ?  ?  ?  ?  ?  ?  ?  ?  ?  ?  ?  ?  ?  ?  ?  ? ?   ?Exercises Total Joint Exercises ?Ankle Circles/Pumps: Strengthening, 10 reps, Both ?Quad Sets: Strengthening, Both, 10 reps ?Towel Squeeze: Strengthening, Both, 10 reps ?Hip ABduction/ADduction: Strengthening, 5 reps, Both ?Straight Leg Raises: Strengthening, Both, 5 reps ?Long [employer]: Strengthening, Both, 15 reps ?Knee Flexion: Strengthening, Both, 15 reps ?Other Exercises ?Other Exercises: Pt education provided on benefits of out of bed to chair ? ?  ?General Comments   ?  ?  ? ?Pertinent Vitals/Pain Pain Assessment ?Pain Assessment: No/denies pain  ? ? ?Home Living   ?  ?  ?  ?  ?  ?  ?  ?  ?  ?   ?  ?Prior Function    ?  ?  ?   ? ?PT Goals (current goals can now be found in the care plan section) Progress towards PT goals: Progressing toward goals ? ?  ?Frequency ? ? ? Min 2X/week ? ? ? ?  ?PT Plan Current plan remains appropriate  ? ? ?Co-evaluation   ?  ?  ?  ?  ? ?  ?AM-PAC PT "6 Clicks" Mobility   ?Outcome Measure ? Help needed turning from your back to your side while in a flat bed without using bedrails?: A Little ?Help needed moving from lying on your back to sitting on the side of a flat bed without using bedrails?: A Little ?Help needed moving to and from a bed to a chair (including a wheelchair)?: A Little ?Help needed standing up from a chair using your arms (e.g., wheelchair or bedside chair)?: A Little ?Help needed to walk in hospital room?: A Little ?Help needed climbing 3-5 steps with a railing? : A Lot ?6 Click Score: 17 ? ?  ?End of Session Equipment Utilized During Treatment: Gait belt ?Activity Tolerance: Patient tolerated treatment well ?Patient left: in chair;with call bell/phone within reach;with chair alarm set;with family/visitor present ?Nurse Communication: Mobility status ?PT Visit Diagnosis: Unsteadiness on feet (R26.81);Other abnormalities of gait and mobility (R26.89);Muscle weakness (generalized) (M62.81);History of falling (Z91.81);Pain ?Pain - Right/Left: Right ?Pain - part  of body: Hip ?  ? ? ?Time: 0354-6568 ?PT Time Calculation (min) (ACUTE ONLY): 23 min ? ?Charges:  $Gait Training: 8-22 mins ?$Therapeutic Exercise: 8-22 mins          ?          ?D. Royetta Asal PT, DPT ?01/24/22, 2:17 PM ? ? ?

## 2022-01-24 NOTE — TOC Progression Note (Signed)
Transition of Care (TOC) - Progression Note  ? ? ?Patient Details  ?Name: Terry Kaiser ?MRN: [id number] ?Date of Birth: 1936/01/08 ? ?Transition of Care (TOC) CM/SW Contact  ?Beverly Sessions, RN ?Phone Number: ?01/24/2022, 10:39 AM ? ?Clinical Narrative:    ? ? ?Per Magda Paganini at [place] is still pending  ?Expected Discharge Plan: [place] ?Barriers to Discharge: Continued Medical Work up ? ?Expected Discharge Plan and Services ?Expected Discharge Plan: [place] ?  ?  ?  ?Living arrangements for the past 2 months: [place] ?                ?  ?  ?  ?  ?  ?  ?  ?  ?  ?  ? ? ?Social Determinants of Health (SDOH) Interventions ?  ? ?Readmission Risk Interventions ? ?  01/20/2022  ? 12:26 PM  ?Readmission Risk Prevention Plan  ?Transportation Screening Complete  ?PCP or Specialist Appt within 5-7 Days Complete  ?Home Care Screening Complete  ?Medication Review (RN CM) Complete  ? ? ?

## 2022-01-24 NOTE — Progress Notes (Signed)
?PROGRESS NOTE ? ? ? ?Terry Kaiser  IFO:[id number] DOB: 29-Oct-1935 DOA: 01/19/2022 ?PCP: Dion Body, MD  ? ? ?Brief Narrative:  ?86 y.o. female with medical history significant for emphysema, mild aortic regurgitation, mitral valve regurgitation, persistent A-fib on Eliquis, type 2 diabetes controlled with diet, hypertension, chronic lower extremity edema, diverticulosis, chronic anemia, who presents from home after a fall.  ?Found with orthostatics. Bp meds all held, started on ivf. Now more stable. SNF pending.  ?  ?4/15 son at bedside. Pt fell this am while going to bathroom. Was told she was "outt" for a few seconds. Bp taken with sbp 80's. Repeat bp improved to 100's. Pt did not hit her head. Per son, pt has trouble findings words started few months ago.  ?  ?4/16 orthostatics +.lying 120/64  sitting 78/58 ?4/18 orthostatics improved. ? ? ?Assessment & Plan: ?  ?Principal Problem: ?  Fall ?Active Problems: ?  DNR (do not resuscitate) ?  Emphysema of lung ([place]) ?  Essential hypertension ?  GAD (generalized anxiety disorder) ?  Bilateral leg edema ?  History of normocytic normochromic anemia ?  Mild aortic regurgitation ?  Nonrheumatic mitral valve regurgitation ?  Persistent atrial fibrillation ([place]) ?  Type 2 diabetes mellitus with hyperlipidemia ([place]) ?  Known medical problems ?  Dementia without behavioral disturbance ([place]) ?  Hypokalemia ?  Ecchymosis ? ?Fall ?Significant fall leading to significant hematoma.  Per discussion with son normally has good mobility but is high risk for deconditioning. ?4/15 had fall this am. ?syncope found with low sbp 80's. Improved. ?Plan: ?Continue holding BP meds ?We will need skilled nursing facility ?Cardiology consulted for recommendations regarding medications ?  ?Orthostatic Hypotension ?Bp meds held ?Plan: ?Continue holding blood pressure medications ?Discontinue IV fluids ?Encourage p.o. fluid intake ?Therapy evaluations ?Skilled nursing facility at time of  discharge ?  ?Dementia without behavioral disturbance ([place]) ?Progressively worse per son. ?Has trouble findings words>>>will obtain MRI ?Rpr , hiv all nml ? 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 Finds also found with related to amyloid angiopathy ?4/18 follow-up with neurology as outpatient which she has an appointment in May ?  ?Acute on chronic blood loss anemia ?History of normocytic, normochromic anemia ?Chronic with unclear etiology. ?- Continue home B12 supplements ?- Consider outpatient colonoscopy ?with hematoma from fall ?4/19: Hemoglobin stable ?  ?Hypokalemia ?Replaced ?Monitor  ?  ?  ?Ecchymosis ?Large hematoma seen on MRI, hemoglobin has dropped about 2 points from 11.7-9.6, will trend CBC and monitor clinically.   ?4/19: Continue holding Eliquis for now ?  ?  ?  ?Known medical problems ?DM2-diet controlled at baseline, CBGs 4 times daily with very sensitive sliding scale correction factor ?A-fib- continue amiodarone, hold Eliquis in setting of hematoma, see separate problem ?HTN- continue amlodipine, enalapril, hydrochlorothiazide, metoprolol ?GERD-continue PPI ?HLD-continue statin ?Anxiety/depression- continue Lexapro ?Osteoporosis-hold Fosamax ? ? ?DVT prophylaxis: SCD ?Code Status: DNR ?Family Communication: Clovia Cuff [phone] on 4/19 ?Disposition Plan: Status is: Inpatient ?Remains inpatient appropriate because: Persistent orthostasis.  Pending cardiology evaluation.  Pending carotid duplex.  Anticipated date of discharge 4/20. ? ? ?Level of care: Med-Surg ? ?Consultants:  ?Cardiology ? ?Procedures:  ?None ? ?Antimicrobials: ?None ? ? ?Subjective: ?Seen and examined.  Resting comfortably in bed.  No visible distress.  No pain complaints.  Answers all questions appropriately. ? ?Objective: ?Vitals:  ? 01/23/22 1503 01/23/22 2100 01/24/22 0527 01/24/22 0815  ?BP: (!) 141/68 (!) 148/60 (!) 159/72 (!) 156/72  ?Pulse: 75 75 77  76  ?Resp: '16 18 18 18  '$ ?Temp: 99.3 ?F (37.4 ?C) 98.9 ?F (37.2 ?C)  97.7 ?F (36.5 ?C) 97.7 ?F (36.5 ?C)  ?TempSrc: Oral Oral  Oral  ?SpO2: 97% 99% 99% 98%  ?Weight:      ?Height:      ? ? ?Intake/Output Summary (Last 24 hours) at 01/24/2022 1339 ?Last data filed at 01/24/2022 1013 ?Gross per 24 hour  ?Intake 240 ml  ?Output 200 ml  ?Net 40 ml  ? ?Filed Weights  ? 01/19/22 0926  ?Weight: 77.1 kg  ? ? ?Examination: ? ?General exam: Appears calm and comfortable  ?Respiratory system: Lungs clear.  Normal work of breathing.  Room air ?Cardiovascular system: S1-S2, regular rate, irregular rhythm, no murmurs ?Gastrointestinal system: Soft, NT/ND, normal bowel sounds ?Central nervous system: Alert and oriented. No focal neurological deficits. ?Extremities: Symmetric 5 x 5 power. ?Skin: No rashes, lesions or ulcers ?Psychiatry: Judgement and insight appear normal. Mood & affect appropriate.  ? ? ? ?Data Reviewed: I have personally reviewed following labs and imaging studies ? ?CBC: ?Recent Labs  ?Lab 01/19/22 ?0929 01/19/22 ?1701 01/20/22 ?2841 01/22/22 ?3244 01/24/22 ?0102  ?WBC 8.9 9.0 7.7 6.4 5.4  ?NEUTROABS 7.8*  --   --   --   --   ?HGB 9.3*  9.6* 9.1* 8.4* 8.4* 8.4*  ?HCT 28.7*  30.0* 28.4* 25.5* 25.1* 25.1*  ?MCV 92.6 92.2 90.7 91.3 90.0  ?PLT 165 161 134* 141* 164  ? ?Basic Metabolic Panel: ?Recent Labs  ?Lab 01/19/22 ?0929 01/20/22 ?0927 01/22/22 ?7253 01/24/22 ?6644  ?NA 140  --   --  135  ?K 3.1* 3.1* 3.3* 3.1*  ?CL 104  --   --  103  ?CO2 27  --   --  26  ?GLUCOSE 204*  --   --  126*  ?BUN 31*  --   --  17  ?CREATININE 0.86  --   --  0.66  ?CALCIUM 8.7*  --   --  8.1*  ?MG  --   --  1.8  --   ? ?GFR: ?Estimated Creatinine Clearance: 55.9 mL/min (by C-G formula based on SCr of 0.66 mg/dL). ?Liver Function Tests: ?Recent Labs  ?Lab 01/19/22 ?0929  ?AST 26  ?ALT 17  ?ALKPHOS 42  ?BILITOT 1.1  ?PROT 6.3*  ?ALBUMIN 3.5  ? ?No results for input(s): LIPASE, AMYLASE in the last 168 hours. ?No results for input(s): AMMONIA in the last 168 hours. ?Coagulation Profile: ?Recent Labs  ?Lab  01/19/22 ?1701  ?INR 1.2  ? ?Cardiac Enzymes: ?Recent Labs  ?Lab 01/19/22 ?0929  ?CKTOTAL 205  ? ?BNP (last 3 results) ?No results for input(s): PROBNP in the last 8760 hours. ?HbA1C: ?No results for input(s): HGBA1C in the last 72 hours. ?CBG: ?Recent Labs  ?Lab 01/22/22 ?2131 01/23/22 ?0756 01/23/22 ?1203 01/23/22 ?1619 01/24/22 ?0347  ?GLUCAP 113* 146* 115* 126* 124*  ? ?Lipid Profile: ?No results for input(s): CHOL, HDL, LDLCALC, TRIG, CHOLHDL, LDLDIRECT in the last 72 hours. ?Thyroid Function Tests: ?No results for input(s): TSH, T4TOTAL, FREET4, T3FREE, THYROIDAB in the last 72 hours. ?Anemia Panel: ?No results for input(s): VITAMINB12, FOLATE, FERRITIN, TIBC, IRON, RETICCTPCT in the last 72 hours. ?Sepsis Labs: ?No results for input(s): PROCALCITON, LATICACIDVEN in the last 168 hours. ? ?No results found for this or any previous visit (from the past 240 hour(s)).  ? ? ? ? ? ?Radiology Studies: ?No results found. ? ? ? ? ? ?Scheduled Meds: ? amiodarone  200 mg Oral BID  ? escitalopram  5 mg Oral Daily  ? insulin aspart  0-6 Units Subcutaneous TID WC  ? pantoprazole  20 mg Oral Daily  ? pravastatin  40 mg Oral q1800  ? sodium chloride flush  3 mL Intravenous Q12H  ? vitamin B-12  500 mcg Oral Daily  ? ?Continuous Infusions: ? ? LOS: 5 days  ? ? ? ? ?Sidney Ace, MD ?[facility] ? ? ?If 7PM-7AM, please contact night-coverage ? ?01/24/2022, 1:39 PM  ? ?

## 2022-01-25 DIAGNOSIS — D649 Anemia, unspecified: Secondary | ICD-10-CM | POA: Diagnosis not present

## 2022-01-25 DIAGNOSIS — T148XXA Other injury of unspecified body region, initial encounter: Secondary | ICD-10-CM | POA: Diagnosis not present

## 2022-01-25 DIAGNOSIS — R55 Syncope and collapse: Secondary | ICD-10-CM | POA: Diagnosis not present

## 2022-01-25 DIAGNOSIS — W19XXXD Unspecified fall, subsequent encounter: Secondary | ICD-10-CM | POA: Diagnosis not present

## 2022-01-25 LAB — GLUCOSE, CAPILLARY
Glucose-Capillary: 136 mg/dL — ABNORMAL HIGH (ref 70–99)
Glucose-Capillary: 153 mg/dL — ABNORMAL HIGH (ref 70–99)
Glucose-Capillary: 125 mg/dL — ABNORMAL HIGH (ref 70–99)
Glucose-Capillary: 117 mg/dL — ABNORMAL HIGH (ref 70–99)

## 2022-01-25 NOTE — Progress Notes (Signed)
[facility] Cardiology ? ? ? ?SUBJECTIVE: Patient disoriented confused still complains of mild discomfort in right hip and thigh no chest pain no palpitations or tachycardia ? ? ?Vitals:  ? 01/25/22 7591 01/25/22 6384 01/25/22 6659 01/25/22 0841  ?BP: (!) 139/58     ?Pulse: 67     ?Resp: 18     ?Temp: 98.1 ?F (36.7 ?C)     ?TempSrc: Oral     ?SpO2: 98% 100% 99% 98%  ?Weight:      ?Height:      ? ? ? ?Intake/Output Summary (Last 24 hours) at 01/25/2022 1004 ?Last data filed at 01/24/2022 2106 ?Gross per 24 hour  ?Intake 460 ml  ?Output --  ?Net 460 ml  ? ? ? ? ?PHYSICAL EXAM ? ?General: Well developed, well nourished, in no acute distress ?HEENT:  Normocephalic and atramatic ?Neck:  No JVD.  ?Lungs: Clear bilaterally to auscultation and percussion. ?Heart: HRRR . Normal S1 and S2 without gallops or murmurs.  ?Abdomen: Bowel sounds are positive, abdomen soft and non-tender  ?Msk:  Back normal, normal gait. Normal strength and tone for age. ?Extremities: No clubbing, cyanosis or edema.  Right hip and thigh large hematoma with ecchymosis mild tenderness ?Neuro: Alert  Confused disoriented ?Psych:  Good affect, responds appropriately ? ? ?LABS: ?Basic Metabolic Panel: ?Recent Labs  ?  01/24/22 ?0807  ?NA 135  ?K 3.1*  ?CL 103  ?CO2 26  ?GLUCOSE 126*  ?BUN 17  ?CREATININE 0.66  ?CALCIUM 8.1*  ? ?Liver Function Tests: ?No results for input(s): AST, ALT, ALKPHOS, BILITOT, PROT, ALBUMIN in the last 72 hours. ?No results for input(s): LIPASE, AMYLASE in the last 72 hours. ?CBC: ?Recent Labs  ?  01/24/22 ?0807  ?WBC 5.4  ?HGB 8.4*  ?HCT 25.1*  ?MCV 90.0  ?PLT 164  ? ?Cardiac Enzymes: ?No results for input(s): CKTOTAL, CKMB, CKMBINDEX, TROPONINI in the last 72 hours. ?BNP: ?Invalid input(s): POCBNP ?D-Dimer: ?No results for input(s): DDIMER in the last 72 hours. ?Hemoglobin A1C: ?No results for input(s): HGBA1C in the last 72 hours. ?Fasting Lipid Panel: ?No results for input(s): CHOL, HDL, LDLCALC, TRIG, CHOLHDL, LDLDIRECT in the last 72  hours. ?Thyroid Function Tests: ?No results for input(s): TSH, T4TOTAL, T3FREE, THYROIDAB in the last 72 hours. ? ?Invalid input(s): FREET3 ?Anemia Panel: ?No results for input(s): VITAMINB12, FOLATE, FERRITIN, TIBC, IRON, RETICCTPCT in the last 72 hours. ? ?US Carotid Bilateral ? ?Result Date: 01/24/2022 ?CLINICAL DATA:  86 year old female with a history of syncope EXAM: BILATERAL CAROTID DUPLEX ULTRASOUND TECHNIQUE: Pearline Cables scale imaging, color Doppler and duplex ultrasound were performed of bilateral carotid and vertebral arteries in the neck. COMPARISON:  None. FINDINGS: Criteria: Quantification of carotid stenosis is based on velocity parameters that correlate the residual internal carotid diameter with NASCET-based stenosis levels, using the diameter of the distal internal carotid lumen as the denominator for stenosis measurement. The following velocity measurements were obtained: RIGHT ICA:  Systolic 84 cm/sec, Diastolic 16 cm/sec CCA:  44 cm/sec SYSTOLIC ICA/CCA RATIO:  1.9 ECA:  188 cm/sec LEFT ICA:  Systolic 935 cm/sec, Diastolic 20 cm/sec CCA:  54 cm/sec SYSTOLIC ICA/CCA RATIO:  2.0 ECA:  132 cm/sec Right Brachial SBP: Not acquired Left Brachial SBP: Not acquired RIGHT CAROTID ARTERY: No significant calcifications of the right common carotid artery. Intermediate waveform maintained. Moderate heterogeneous and partially calcified plaque at the right carotid bifurcation. Lumen shadowing present. Low resistance waveform of the right ICA. Tortuosity RIGHT VERTEBRAL ARTERY: Antegrade flow with low resistance waveform.  LEFT CAROTID ARTERY: No significant calcifications of the left common carotid artery. Intermediate waveform maintained. Moderate heterogeneous and partially calcified plaque at the left carotid bifurcation. Lumen shadowing present. Low resistance waveform of the left ICA. Tortuosity LEFT VERTEBRAL ARTERY:  Antegrade flow with low resistance waveform. IMPRESSION: Color duplex indicates moderate  heterogeneous and calcified plaque, with no hemodynamically significant stenosis by duplex criteria in the extracranial cerebrovascular circulation, however, flow velocities of the bilateral ICA were obtained from an area distal to the maximum narrowing due to the presence of anterior wall plaque with shadowing and may be underestimating the percentage of ICA stenosis. If establishing a more accurate degree of stenosis is required, cerebral angiogram should be considered, or as a second best test, CTA. Signed, Dulcy Fanny. Dellia Nims, RPVI [facility] Radiology Electronically Signed   By: Corrie Mckusick D.O.   On: 01/24/2022 16:17   ? ? ? ? ?TELEMETRY: Atrial fibrillation rate of 75 nonspecific ST-T wave changes: ? ?ASSESSMENT AND PLAN: ? ?Principal Problem: ?  Fall ?Active Problems: ?  DNR (do not resuscitate) ?  Emphysema of lung ([place]) ?  Essential hypertension ?  GAD (generalized anxiety disorder) ?  Bilateral leg edema ?  History of normocytic normochromic anemia ?  Mild aortic regurgitation ?  Nonrheumatic mitral valve regurgitation ?  Persistent atrial fibrillation ([place]) ?  Type 2 diabetes mellitus with hyperlipidemia ([place]) ?  Known medical problems ?  Dementia without behavioral disturbance ([place]) ?  Hypokalemia ?  Ecchymosis ?  ? ?Plan ?Consider physical therapy for fall to help with balance ?Reevaluate due to safety of DOAC therapy because of her unsteadiness and risk of falling and bleeding ?Carotid Dopplers for evaluation of carotid disease ?Hold blood pressure medications because of potential hypotension ?Encourage hydration with p.o. fluids ?Dementia makes management very difficult refer back to skilled nursing facility ?Significant hematoma right hip and thigh follow-up H&H transfuse if necessary ?Continue diabetes management and control ?Continue amiodarone for atrial fibrillation but hold anticoagulation ?Agree with PPI for GERD ? ? ? ?Yolonda Kida,  MD ?01/25/2022 ?10:04 AM ? ? ? ?  ?

## 2022-01-25 NOTE — Progress Notes (Signed)
?PROGRESS NOTE ? ? ? ?Terry Kaiser  JJH:[id number] DOB: 11-28-1935 DOA: 01/19/2022 ?PCP: Dion Body, MD  ? ? ?Brief Narrative:  ?86 y.o. female with medical history significant for emphysema, mild aortic regurgitation, mitral valve regurgitation, persistent A-fib on Eliquis, type 2 diabetes controlled with diet, hypertension, chronic lower extremity edema, diverticulosis, chronic anemia, who presents from home after a fall.  ?Found with orthostatics. Bp meds all held, started on ivf. Now more stable. SNF pending.  ?  ?4/15 son at bedside. Pt fell this am while going to bathroom. Was told she was "outt" for a few seconds. Bp taken with sbp 80's. Repeat bp improved to 100's. Pt did not hit her head. Per son, pt has trouble findings words started few months ago.  ?  ?4/16 orthostatics +.lying 120/64  sitting 78/58 ?4/18 orthostatics improved. ?4/20.  No status changes.  Patient medically stable for dc.  Pending authorization for SNF ? ? ?Assessment & Plan: ?  ?Principal Problem: ?  Fall ?Active Problems: ?  DNR (do not resuscitate) ?  Emphysema of lung ([place]) ?  Essential hypertension ?  GAD (generalized anxiety disorder) ?  Bilateral leg edema ?  History of normocytic normochromic anemia ?  Mild aortic regurgitation ?  Nonrheumatic mitral valve regurgitation ?  Persistent atrial fibrillation ([place]) ?  Type 2 diabetes mellitus with hyperlipidemia ([place]) ?  Known medical problems ?  Dementia without behavioral disturbance ([place]) ?  Hypokalemia ?  Ecchymosis ? ?Fall ?Significant fall leading to significant hematoma.  Per discussion with son normally has good mobility but is high risk for deconditioning. ?4/15 had fall this am. ?syncope found with low sbp 80's. Improved. ?Carotid duplex with no significant stenosis ?Plan: ?Continue holding BP meds ?Cards following ?Hold DOAC ?Risk/benefit of restarting AC ?Recommend outpatient cardiology followup ?  ?Orthostatic Hypotension ?Bp meds held ?Plan: ?Continue holding  blood pressure medications ?No further IVF  ?Encourage p.o. fluid intake ?Therapy evaluations ?Skilled nursing facility at time of discharge ?  ?Dementia without behavioral disturbance ([place]) ?Progressively worse per son. ?Has trouble findings words>>>will obtain MRI ?Rpr , hiv all nml ? 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 Finds also found with related to amyloid angiopathy ?4/18 follow-up with neurology as outpatient which she has an appointment in May ?  ?Acute on chronic blood loss anemia ?History of normocytic, normochromic anemia ?Chronic with unclear etiology. ?- Continue home B12 supplements ?- Consider outpatient colonoscopy ?with hematoma from fall ?4/19: Hemoglobin stable ?  ?Hypokalemia ?Replaced ?Monitor  ?  ?  ?Ecchymosis ?Large hematoma seen on MRI, hemoglobin has dropped about 2 points from 11.7-9.6, will trend CBC and monitor clinically.   ?4/19: Continue holding Eliquis for now.  Will likely hold on dc ?  ?  ?  ?Known medical problems ?DM2-diet controlled at baseline, CBGs 4 times daily with very sensitive sliding scale correction factor ?A-fib- continue amiodarone, hold Eliquis in setting of hematoma, see separate problem ?HTN- continue amlodipine, enalapril, hydrochlorothiazide, metoprolol ?GERD-continue PPI ?HLD-continue statin ?Anxiety/depression- continue Lexapro ?Osteoporosis-hold Fosamax ? ? ?DVT prophylaxis: SCD ?Code Status: DNR ?Family Communication: Clovia Cuff [phone] on 4/19 ?Disposition Plan: Status is: Inpatient ?Remains inpatient appropriate because: Unsafe discharge plan.  Pending auth for SNF ? ? ?Level of care: Med-Surg ? ?Consultants:  ?Cardiology ? ?Procedures:  ?None ? ?Antimicrobials: ?None ? ? ?Subjective: ?Seen and examined.  Resting comfortably in bed.  No visible distress.  No pain complaints.  Answers all questions appropriately. ? ?Objective: ?Vitals:  ? 01/25/22 0263  01/25/22 2993 01/25/22 7169 01/25/22 0841  ?BP: (!) 139/58     ?Pulse: 67     ?Resp: 18      ?Temp: 98.1 ?F (36.7 ?C)     ?TempSrc: Oral     ?SpO2: 98% 100% 99% 98%  ?Weight:      ?Height:      ? ? ?Intake/Output Summary (Last 24 hours) at 01/25/2022 1119 ?Last data filed at 01/24/2022 2106 ?Gross per 24 hour  ?Intake 220 ml  ?Output --  ?Net 220 ml  ? ?Filed Weights  ? 01/19/22 0926  ?Weight: 77.1 kg  ? ? ?Examination: ? ?General exam: NAD ?Respiratory system: Lungs clear.  Normal work of breathing.  Room air ?Cardiovascular system: S1-S2, RRR, no murmurs ?Gastrointestinal system: Soft, NT/ND, normal bowel sounds ?Central nervous system: Alert and oriented. No focal neurological deficits. ?Extremities: Symmetric 5 x 5 power. ?Skin: No rashes, lesions or ulcers ?Psychiatry: Judgement and insight appear normal. Mood & affect appropriate.  ? ? ? ?Data Reviewed: I have personally reviewed following labs and imaging studies ? ?CBC: ?Recent Labs  ?Lab 01/19/22 ?0929 01/19/22 ?1701 01/20/22 ?6789 01/22/22 ?3810 01/24/22 ?1751  ?WBC 8.9 9.0 7.7 6.4 5.4  ?NEUTROABS 7.8*  --   --   --   --   ?HGB 9.3*  9.6* 9.1* 8.4* 8.4* 8.4*  ?HCT 28.7*  30.0* 28.4* 25.5* 25.1* 25.1*  ?MCV 92.6 92.2 90.7 91.3 90.0  ?PLT 165 161 134* 141* 164  ? ?Basic Metabolic Panel: ?Recent Labs  ?Lab 01/19/22 ?0929 01/20/22 ?0927 01/22/22 ?0258 01/24/22 ?5277  ?NA 140  --   --  135  ?K 3.1* 3.1* 3.3* 3.1*  ?CL 104  --   --  103  ?CO2 27  --   --  26  ?GLUCOSE 204*  --   --  126*  ?BUN 31*  --   --  17  ?CREATININE 0.86  --   --  0.66  ?CALCIUM 8.7*  --   --  8.1*  ?MG  --   --  1.8  --   ? ?GFR: ?Estimated Creatinine Clearance: 55.9 mL/min (by C-G formula based on SCr of 0.66 mg/dL). ?Liver Function Tests: ?Recent Labs  ?Lab 01/19/22 ?0929  ?AST 26  ?ALT 17  ?ALKPHOS 42  ?BILITOT 1.1  ?PROT 6.3*  ?ALBUMIN 3.5  ? ?No results for input(s): LIPASE, AMYLASE in the last 168 hours. ?No results for input(s): AMMONIA in the last 168 hours. ?Coagulation Profile: ?Recent Labs  ?Lab 01/19/22 ?1701  ?INR 1.2  ? ?Cardiac Enzymes: ?Recent Labs  ?Lab  01/19/22 ?0929  ?CKTOTAL 205  ? ?BNP (last 3 results) ?No results for input(s): PROBNP in the last 8760 hours. ?HbA1C: ?No results for input(s): HGBA1C in the last 72 hours. ?CBG: ?Recent Labs  ?Lab 01/24/22 ?0812 01/24/22 ?1132 01/24/22 ?1624 01/24/22 ?2042 01/25/22 ?0844  ?GLUCAP 120* 124* 107* 162* 136*  ? ?Lipid Profile: ?No results for input(s): CHOL, HDL, LDLCALC, TRIG, CHOLHDL, LDLDIRECT in the last 72 hours. ?Thyroid Function Tests: ?No results for input(s): TSH, T4TOTAL, FREET4, T3FREE, THYROIDAB in the last 72 hours. ?Anemia Panel: ?No results for input(s): VITAMINB12, FOLATE, FERRITIN, TIBC, IRON, RETICCTPCT in the last 72 hours. ?Sepsis Labs: ?No results for input(s): PROCALCITON, LATICACIDVEN in the last 168 hours. ? ?No results found for this or any previous visit (from the past 240 hour(s)).  ? ? ? ? ? ?Radiology Studies: ?US Carotid Bilateral ? ?Result Date: 01/24/2022 ?CLINICAL DATA:  86 year old female  with a history of syncope EXAM: BILATERAL CAROTID DUPLEX ULTRASOUND TECHNIQUE: Pearline Cables scale imaging, color Doppler and duplex ultrasound were performed of bilateral carotid and vertebral arteries in the neck. COMPARISON:  None. FINDINGS: Criteria: Quantification of carotid stenosis is based on velocity parameters that correlate the residual internal carotid diameter with NASCET-based stenosis levels, using the diameter of the distal internal carotid lumen as the denominator for stenosis measurement. The following velocity measurements were obtained: RIGHT ICA:  Systolic 84 cm/sec, Diastolic 16 cm/sec CCA:  44 cm/sec SYSTOLIC ICA/CCA RATIO:  1.9 ECA:  188 cm/sec LEFT ICA:  Systolic 277 cm/sec, Diastolic 20 cm/sec CCA:  54 cm/sec SYSTOLIC ICA/CCA RATIO:  2.0 ECA:  132 cm/sec Right Brachial SBP: Not acquired Left Brachial SBP: Not acquired RIGHT CAROTID ARTERY: No significant calcifications of the right common carotid artery. Intermediate waveform maintained. Moderate heterogeneous and partially calcified  plaque at the right carotid bifurcation. Lumen shadowing present. Low resistance waveform of the right ICA. Tortuosity RIGHT VERTEBRAL ARTERY: Antegrade flow with low resistance waveform. LEFT CAROTID ARTERY: No sign

## 2022-01-25 NOTE — Plan of Care (Signed)
  Problem: Clinical Measurements: Goal: Cardiovascular complication will be avoided Outcome: Not Progressing   Problem: Safety: Goal: Ability to remain free from injury will improve Outcome: Not Progressing   

## 2022-01-25 NOTE — Care Management Important Message (Signed)
Important Message ? ?Patient Details  ?Name: Terry Kaiser ?MRN: [id number] ?Date of Birth: Jun 23, 1936 ? ? ?Medicare Important Message Given:  Yes ? ? ? ? ?[name] ?01/25/2022, 2:51 PM ?

## 2022-01-25 NOTE — TOC Progression Note (Signed)
Transition of Care (TOC) - Progression Note  ? ? ?Patient Details  ?Name: Terry Kaiser ?MRN: [id number] ?Date of Birth: [birth year] ? ?Transition of Care (TOC) CM/SW Contact  ?Beverly Sessions, RN ?Phone Number: ?01/25/2022, 2:35 PM ? ?Clinical Narrative:    ? ?Per Magda Paganini at [facility] still pending  ? ?Expected Discharge Plan: [place] ?Barriers to Discharge: Continued Medical Work up ? ?Expected Discharge Plan and Services ?Expected Discharge Plan: [place] ?  ?  ?  ?Living arrangements for the past 2 months: [place] ?                ?  ?  ?  ?  ?  ?  ?  ?  ?  ?  ? ? ?Social Determinants of Health (SDOH) Interventions ?  ? ?Readmission Risk Interventions ? ?  01/20/2022  ? 12:26 PM  ?Readmission Risk Prevention Plan  ?Transportation Screening Complete  ?PCP or Specialist Appt within 5-7 Days Complete  ?Home Care Screening Complete  ?Medication Review (RN CM) Complete  ? ? ?

## 2022-01-25 NOTE — Progress Notes (Signed)
PT Cancellation Note ? ?Patient Details ?Name: Terry Kaiser ?MRN: [id number] ?DOB: 1936/09/10 ? ? ?Cancelled Treatment:    Reason Eval/Treat Not Completed: Patient declined, no reason specified. Pt pleasantly declining PT stating she is "discharging today". PT will continue to follow per POC and re-attempt at later time/date if pt is still admitted.  ? ? ?Salem Caster. Fairly IV, PT, DPT ?Physical Therapist- [place]  ?[facility]  ?01/25/2022, 2:08 PM ?

## 2022-01-26 DIAGNOSIS — G309 Alzheimer's disease, unspecified: Secondary | ICD-10-CM | POA: Diagnosis not present

## 2022-01-26 DIAGNOSIS — I959 Hypotension, unspecified: Secondary | ICD-10-CM | POA: Diagnosis not present

## 2022-01-26 DIAGNOSIS — M7989 Other specified soft tissue disorders: Secondary | ICD-10-CM | POA: Diagnosis not present

## 2022-01-26 DIAGNOSIS — W19XXXD Unspecified fall, subsequent encounter: Secondary | ICD-10-CM | POA: Diagnosis not present

## 2022-01-26 DIAGNOSIS — M25551 Pain in right hip: Secondary | ICD-10-CM | POA: Diagnosis not present

## 2022-01-26 DIAGNOSIS — F0280 Dementia in other diseases classified elsewhere without behavioral disturbance: Secondary | ICD-10-CM | POA: Diagnosis not present

## 2022-01-26 DIAGNOSIS — I351 Nonrheumatic aortic (valve) insufficiency: Secondary | ICD-10-CM | POA: Diagnosis not present

## 2022-01-26 DIAGNOSIS — R296 Repeated falls: Secondary | ICD-10-CM | POA: Diagnosis not present

## 2022-01-26 DIAGNOSIS — I951 Orthostatic hypotension: Secondary | ICD-10-CM | POA: Diagnosis not present

## 2022-01-26 DIAGNOSIS — I700 Atherosclerosis of aorta: Secondary | ICD-10-CM | POA: Diagnosis not present

## 2022-01-26 DIAGNOSIS — R55 Syncope and collapse: Secondary | ICD-10-CM | POA: Diagnosis not present

## 2022-01-26 DIAGNOSIS — F064 Anxiety disorder due to known physiological condition: Secondary | ICD-10-CM | POA: Diagnosis not present

## 2022-01-26 DIAGNOSIS — E876 Hypokalemia: Secondary | ICD-10-CM | POA: Diagnosis not present

## 2022-01-26 DIAGNOSIS — J439 Emphysema, unspecified: Secondary | ICD-10-CM | POA: Diagnosis not present

## 2022-01-26 DIAGNOSIS — M6281 Muscle weakness (generalized): Secondary | ICD-10-CM | POA: Diagnosis not present

## 2022-01-26 DIAGNOSIS — F411 Generalized anxiety disorder: Secondary | ICD-10-CM | POA: Diagnosis not present

## 2022-01-26 DIAGNOSIS — E440 Moderate protein-calorie malnutrition: Secondary | ICD-10-CM | POA: Diagnosis not present

## 2022-01-26 DIAGNOSIS — I4892 Unspecified atrial flutter: Secondary | ICD-10-CM | POA: Diagnosis not present

## 2022-01-26 DIAGNOSIS — Z7901 Long term (current) use of anticoagulants: Secondary | ICD-10-CM | POA: Diagnosis not present

## 2022-01-26 DIAGNOSIS — Z743 Need for continuous supervision: Secondary | ICD-10-CM | POA: Diagnosis not present

## 2022-01-26 DIAGNOSIS — F0150 Vascular dementia without behavioral disturbance: Secondary | ICD-10-CM | POA: Diagnosis not present

## 2022-01-26 DIAGNOSIS — E559 Vitamin D deficiency, unspecified: Secondary | ICD-10-CM | POA: Diagnosis not present

## 2022-01-26 DIAGNOSIS — I482 Chronic atrial fibrillation, unspecified: Secondary | ICD-10-CM | POA: Diagnosis not present

## 2022-01-26 DIAGNOSIS — K219 Gastro-esophageal reflux disease without esophagitis: Secondary | ICD-10-CM | POA: Diagnosis not present

## 2022-01-26 DIAGNOSIS — I10 Essential (primary) hypertension: Secondary | ICD-10-CM

## 2022-01-26 DIAGNOSIS — F03A4 Unspecified dementia, mild, with anxiety: Secondary | ICD-10-CM | POA: Diagnosis not present

## 2022-01-26 DIAGNOSIS — M150 Primary generalized (osteo)arthritis: Secondary | ICD-10-CM | POA: Diagnosis not present

## 2022-01-26 DIAGNOSIS — I4819 Other persistent atrial fibrillation: Secondary | ICD-10-CM | POA: Diagnosis not present

## 2022-01-26 DIAGNOSIS — E119 Type 2 diabetes mellitus without complications: Secondary | ICD-10-CM | POA: Diagnosis not present

## 2022-01-26 DIAGNOSIS — M25351 Other instability, right hip: Secondary | ICD-10-CM | POA: Diagnosis not present

## 2022-01-26 LAB — GLUCOSE, CAPILLARY
Glucose-Capillary: 130 mg/dL — ABNORMAL HIGH (ref 70–99)
Glucose-Capillary: 183 mg/dL — ABNORMAL HIGH (ref 70–99)

## 2022-01-26 MED ORDER — OXYCODONE HCL 5 MG PO TABS: 5.0000 mg | ORAL_TABLET | ORAL | 0 refills | Status: AC | PRN

## 2022-01-26 NOTE — Plan of Care (Signed)
  Problem: Clinical Measurements: Goal: Cardiovascular complication will be avoided Outcome: Not Progressing   Problem: Safety: Goal: Ability to remain free from injury will improve Outcome: Not Progressing   

## 2022-01-26 NOTE — Discharge Summary (Signed)
Physician Discharge Summary  ?MESA JANUS QIW:[id number] DOB: Aug 29, 1936 DOA: 01/19/2022 ? ?PCP: Dion Body, MD ? ?Admit date: 01/19/2022 ?Discharge date: 01/26/2022 ? ?Admitted From: Home ?Disposition:  SNF ? ?Recommendations for Outpatient Follow-up:  ?Follow up with PCP in 1-2 weeks ?Follow up with cardiology 1-2 weeks Nehemiah Massed) ? ?Home Health:No  ?Equipment/Devices:None  ? ?Discharge Condition:Stable  ?CODE STATUS:DNR  ?Diet recommendation: Heart healthy ? ?Brief/Interim Summary: ?86 y.o. female with medical history significant for emphysema, mild aortic regurgitation, mitral valve regurgitation, persistent A-fib on Eliquis, type 2 diabetes controlled with diet, hypertension, chronic lower extremity edema, diverticulosis, chronic anemia, who presents from home after a fall.  ?Found with orthostatics. Bp meds all held, started on ivf. Now more stable. SNF pending.  ?  ?4/15 son at bedside. Pt fell this am while going to bathroom. Was told she was "outt" for a few seconds. Bp taken with sbp 80's. Repeat bp improved to 100's. Pt did not hit her head. Per son, pt has trouble findings words started few months ago.  ?  ?4/16 orthostatics +.lying 120/64  sitting 78/58 ?4/18 orthostatics improved. ?4/20.  No status changes.  Patient medically stable for dc.  Pending authorization for SNF ?4/21: SNF authorization obtained.  Patient discharged in stable condition. ? ? ? ?Discharge Diagnoses:  ?Principal Problem: ?  Fall ?Active Problems: ?  DNR (do not resuscitate) ?  Emphysema of lung ([place]) ?  Essential hypertension ?  GAD (generalized anxiety disorder) ?  Bilateral leg edema ?  History of normocytic normochromic anemia ?  Mild aortic regurgitation ?  Nonrheumatic mitral valve regurgitation ?  Persistent atrial fibrillation ([place]) ?  Type 2 diabetes mellitus with hyperlipidemia ([place]) ?  Known medical problems ?  Dementia without behavioral disturbance ([place]) ?  Hypokalemia ?  Ecchymosis ? ?Fall ?Significant fall  leading to significant hematoma.  Per discussion with son normally has good mobility but is high risk for deconditioning. ?4/15 had fall this am. ?syncope found with low sbp 80's. Improved. ?Carotid duplex with no significant stenosis ?Plan: ?Discharge to skilled nursing facility.  Have resumed home beta-blocker and ACE inhibitor.  Hold amlodipine and hydrochlorothiazide at time of DC.  Follow-up outpatient PCP to discuss blood pressure control strategy. ? ?Given recent fall and risk for life-threatening bleed will hold home Eliquis on discharge.  Discussed with cardiology.  Patient will follow-up with established cardiology postdischarge to discuss risk/benefit of restarting anticoagulation ? ?  ?Orthostatic Hypotension ?Bp meds held ?Plan: ?At time of discharge will resume home beta-blocker and ACE inhibitor.  Hold home amlodipine and hydrochlorothiazide.  Hydrochlorothiazide not preferred agent in elderly with fall risk.  Discharge to skilled nursing facility. ?  ?Dementia without behavioral disturbance ([place]) ?Progressively worse per son. ?Has trouble findings words>>>will obtain MRI ?Rpr , hiv all nml ? 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 Finds also found with related to amyloid angiopathy ?4/18 follow-up with neurology as outpatient which she has an appointment in May.  Son states she has an appointment with Dr. Manuella Ghazi ?  ?Acute on chronic blood loss anemia ?History of normocytic, normochromic anemia ?Chronic with unclear etiology. ?- Continue home B12 supplements ?- Consider outpatient colonoscopy ?with hematoma from fall ?Hemoglobin stable at time of discharge ?  ?Hypokalemia ?Replaced ?Monitor  ?  ?  ?Ecchymosis ?Large hematoma seen on MRI, hemoglobin has dropped about 2 points from 11.7-9.6, will trend CBC and monitor clinically.   ?Hold Eliquis on DC as above ?  ?  ?  ?  ? ?  Discharge Instructions ? ?Discharge Instructions   ? ? Diet - low sodium heart healthy   Complete by: As directed ?  ? Increase  activity slowly   Complete by: As directed ?  ? ?  ? ?Allergies as of 01/26/2022   ?No Known Allergies ?  ? ?  ?Medication List  ?  ? ?STOP taking these medications   ? ?amLODipine 10 MG tablet ?Commonly known as: NORVASC ?  ?apixaban 5 MG Tabs tablet ?Commonly known as: ELIQUIS ?  ?Cinnamon 500 MG Tabs ?  ?hydrochlorothiazide 25 MG tablet ?Commonly known as: HYDRODIURIL ?  ?vitamin E 180 MG (400 UNITS) capsule ?  ? ?  ? ?TAKE these medications   ? ?alendronate 70 MG tablet ?Commonly known as: FOSAMAX ?Take 70 mg by mouth once a week. Take with a full glass of water on an empty stomach. ?  ?amiodarone 200 MG tablet ?Commonly known as: PACERONE ?Take 200 mg by mouth 2 (two) times daily. ?  ?Calcium Carb-Cholecalciferol 600-400 MG-UNIT Tabs ?Take 1 tablet by mouth 2 (two) times daily. ?  ?enalapril 20 MG tablet ?Commonly known as: VASOTEC ?Take 20 mg by mouth daily. ?  ?escitalopram 5 MG tablet ?Commonly known as: LEXAPRO ?Take 5 mg by mouth daily. ?  ?lovastatin 40 MG tablet ?Commonly known as: MEVACOR ?Take 40 mg by mouth at bedtime. ?  ?metoprolol succinate 25 MG 24 hr tablet ?Commonly known as: TOPROL-XL ?Take 25 mg by mouth 2 (two) times daily. Take with or immediately following a meal. ?  ?oxyCODONE 5 MG immediate release tablet ?Commonly known as: Oxy IR/ROXICODONE ?Take 1 tablet (5 mg total) by mouth every 4 (four) hours as needed for up to 2 days for moderate pain. ?  ?pantoprazole 20 MG tablet ?Commonly known as: PROTONIX ?Take 20 mg by mouth daily. dinner ?  ?potassium chloride 10 MEQ tablet ?Commonly known as: KLOR-CON M ?Take 20 mEq by mouth 2 (two) times daily. ?  ?vitamin B-12 500 MCG tablet ?Commonly known as: CYANOCOBALAMIN ?Take 500 mcg by mouth daily. ?  ?vitamin C 500 MG tablet ?Commonly known as: ASCORBIC ACID ?Take 500 mg by mouth 2 (two) times daily. ?  ? ?  ? ? Contact information for follow-up providers   ? ? Corey Skains, MD. Schedule an appointment as soon as possible for a visit in 2  week(s).   ?Specialty: Cardiology ?Contact information: ?[address] ?[facility] ?[place] [address] ?[phone] ? ? ?  ?  ? ?  ?  ? ? Contact information for after-discharge care   ? ? Destination   ? ? [facility] .   ?Service: Skilled Nursing ?Contact information: ?[address] ?[place] ?[phone] ? ?  ?  ? ?  ?  ? ?  ?  ? ?  ? ?No Known Allergies ? ?Consultations: ?Cardiology ? ? ?Procedures/Studies: ?CT Head Wo Contrast ? ?Result Date: 01/19/2022 ?CLINICAL DATA:  The fall Head trauma EXAM: CT HEAD WITHOUT CONTRAST TECHNIQUE: Contiguous axial images were obtained from the base of the skull through the vertex without intravenous contrast. RADIATION DOSE REDUCTION: This exam was performed according to the departmental dose-optimization program which includes automated exposure control, adjustment of the mA and/or kV according to patient size and/or use of iterative reconstruction technique. COMPARISON:  None. FINDINGS: Brain: No evidence of acute infarction, hemorrhage, hydrocephalus, extra-axial collection or mass lesion/mass effect.  Diffuse cortical atrophy. Periventricular white matter hypodensity is a nonspecific finding, but most commonly relates to chronic ischemic small vessel disease. Focal hypodensity in the left basal ganglia is likely a prominent space Virchow-Robin. Vascular: No hyperdense vessel or unexpected calcification. Skull: Normal. Negative for fracture or focal lesion. Sinuses/Orbits: No acute finding. Other: None. IMPRESSION: No acute intracranial abnormality. Electronically Signed   By: Miachel Roux M.D.   On: 01/19/2022 10:24  ? ?CT Cervical Spine Wo Contrast ? ?Result Date: 01/19/2022 ?CLINICAL DATA:  Fall EXAM: CT CERVICAL SPINE WITHOUT CONTRAST TECHNIQUE: Multidetector CT imaging of the cervical spine was performed without intravenous  contrast. Multiplanar CT image reconstructions were also generated. RADIATION DOSE REDUCTION: This exam was performed according to the departmental dose-optimization program which includes automated exposu

## 2022-01-26 NOTE — Progress Notes (Signed)
[facility] ? ? ? ?SUBJECTIVE: Slightly confused alert denies any significant pain except right hip and thigh because of bruising she feels much improved no lightheaded dizziness ready for discharge ? ? ?Vitals:  ? 01/25/22 1522 01/25/22 1934 01/26/22 0209 01/26/22 0745  ?BP: (!) 127/58 135/61 135/79 125/67  ?Pulse: 71 80 68 70  ?Resp: '18 18 18   '$ ?Temp: 98.2 ?F (36.8 ?C) 98 ?F (36.7 ?C) 98.2 ?F (36.8 ?C) 97.8 ?F (36.6 ?C)  ?TempSrc: Oral Oral    ?SpO2: 98% 99% 99% 100%  ?Weight:      ?Height:      ? ? ? ?Intake/Output Summary (Last 24 hours) at 01/26/2022 1140 ?Last data filed at 01/26/2022 [phone] ?Gross per 24 hour  ?Intake 118 ml  ?Output --  ?Net 118 ml  ? ? ? ? ?PHYSICAL EXAM ? ?General: Well developed, well nourished, in no acute distress ?HEENT:  Normocephalic and atramatic ?Neck:  No JVD.  ?Lungs: Clear bilaterally to auscultation and percussion. ?Heart: Irregular irregular. Normal S1 and S2 without gallops or murmurs.  ?Abdomen: Bowel sounds are positive, abdomen soft and non-tender  ?Msk:  Back normal, normal gait. Normal strength and tone for age. ?Extremities: No clubbing, cyanosis or edema.  Right hip and thigh ecchymosis hematoma bruising ?Neuro: Alert and disoriented  ?Psych:  Good affect, responds appropriately ? ? ?LABS: ?Basic Metabolic Panel: ?Recent Labs  ?  01/24/22 ?0807  ?NA 135  ?K 3.1*  ?CL 103  ?CO2 26  ?GLUCOSE 126*  ?BUN 17  ?CREATININE 0.66  ?CALCIUM 8.1*  ? ?Liver Function Tests: ?No results for input(s): AST, ALT, ALKPHOS, BILITOT, PROT, ALBUMIN in the last 72 hours. ?No results for input(s): LIPASE, AMYLASE in the last 72 hours. ?CBC: ?Recent Labs  ?  01/24/22 ?0807  ?WBC 5.4  ?HGB 8.4*  ?HCT 25.1*  ?MCV 90.0  ?PLT 164  ? ?Cardiac Enzymes: ?No results for input(s): CKTOTAL, CKMB, CKMBINDEX, TROPONINI in the last 72 hours. ?BNP: ?Invalid input(s): POCBNP ?D-Dimer: ?No results for input(s): DDIMER in the last 72 hours. ?Hemoglobin A1C: ?No results for input(s): HGBA1C in the last 72  hours. ?Fasting Lipid Panel: ?No results for input(s): CHOL, HDL, LDLCALC, TRIG, CHOLHDL, LDLDIRECT in the last 72 hours. ?Thyroid Function Tests: ?No results for input(s): TSH, T4TOTAL, T3FREE, THYROIDAB in the last 72 hours. ? ?Invalid input(s): FREET3 ?Anemia Panel: ?No results for input(s): VITAMINB12, FOLATE, FERRITIN, TIBC, IRON, RETICCTPCT in the last 72 hours. ? ?US Carotid Bilateral ? ?Result Date: 01/24/2022 ?CLINICAL DATA:  86 year old female with a history of syncope EXAM: BILATERAL CAROTID DUPLEX ULTRASOUND TECHNIQUE: Pearline Cables scale imaging, color Doppler and duplex ultrasound were performed of bilateral carotid and vertebral arteries in the neck. COMPARISON:  None. FINDINGS: Criteria: Quantification of carotid stenosis is based on velocity parameters that correlate the residual internal carotid diameter with NASCET-based stenosis levels, using the diameter of the distal internal carotid lumen as the denominator for stenosis measurement. The following velocity measurements were obtained: RIGHT ICA:  Systolic 84 cm/sec, Diastolic 16 cm/sec CCA:  44 cm/sec SYSTOLIC ICA/CCA RATIO:  1.9 ECA:  188 cm/sec LEFT ICA:  Systolic 622 cm/sec, Diastolic 20 cm/sec CCA:  54 cm/sec SYSTOLIC ICA/CCA RATIO:  2.0 ECA:  132 cm/sec Right Brachial SBP: Not acquired Left Brachial SBP: Not acquired RIGHT CAROTID ARTERY: No significant calcifications of the right common carotid artery. Intermediate waveform maintained. Moderate heterogeneous and partially calcified plaque at the right carotid bifurcation. Lumen shadowing present. Low resistance waveform of the right  ICA. Tortuosity RIGHT VERTEBRAL ARTERY: Antegrade flow with low resistance waveform. LEFT CAROTID ARTERY: No significant calcifications of the left common carotid artery. Intermediate waveform maintained. Moderate heterogeneous and partially calcified plaque at the left carotid bifurcation. Lumen shadowing present. Low resistance waveform of the left ICA. Tortuosity LEFT  VERTEBRAL ARTERY:  Antegrade flow with low resistance waveform. IMPRESSION: Color duplex indicates moderate heterogeneous and calcified plaque, with no hemodynamically significant stenosis by duplex criteria in the extracranial cerebrovascular circulation, however, flow velocities of the bilateral ICA were obtained from an area distal to the maximum narrowing due to the presence of anterior wall plaque with shadowing and may be underestimating the percentage of ICA stenosis. If establishing a more accurate degree of stenosis is required, cerebral angiogram should be considered, or as a second best test, CTA. Signed, Dulcy Fanny. Dellia Nims, RPVI [facility] Electronically Signed   By: Corrie Mckusick D.O.   On: 01/24/2022 16:17   ? ? ? ? ?TELEMETRY: Atrial fibrillation rate of 70: ? ?ASSESSMENT AND PLAN: ? ?Principal Problem: ?  Fall ?Active Problems: ?  DNR (do not resuscitate) ?  Emphysema of lung ([place]) ?  Essential hypertension ?  GAD (generalized anxiety disorder) ?  Bilateral leg edema ?  History of normocytic normochromic anemia ?  Mild aortic regurgitation ?  Nonrheumatic mitral valve regurgitation ?  Persistent atrial fibrillation ([place]) ?  Type 2 diabetes mellitus with hyperlipidemia ([place]) ?  Known medical problems ?  Dementia without behavioral disturbance ([place]) ?  Hypokalemia ?  Ecchymosis ?  ? ?Plan ?Continue to hold anticoagulation ?Fall precautions because of recent significant injury and hematoma ?Physical therapy ?Okay to resume blood pressure medications on his blood pressure stays reasonably high enough ?Recommend skilled nursing facility for convalescence ?Maintain adequate hydration for orthostatic changes ?Correct electrolytes including hypokalemia ?Dementia appears to be relatively stable recommend further evaluation as outpatient by neurology with Dr. Manuella Ghazi ?Atrial fibrillation maintain amiodarone and metoprolol we will continue to hold  anticoagulation because of risk of fall ? ?Yolonda Kida, MD, ?01/26/2022 ?11:40 AM ? ? ? ?  ?

## 2022-01-26 NOTE — TOC Transition Note (Signed)
Transition of Care (TOC) - CM/SW Discharge Note ? ? ?Patient Details  ?Name: Terry Kaiser ?MRN: [id number] ?Date of Birth: [birth year] ? ?Transition of Care (TOC) CM/SW Contact:  ?Beverly Sessions, RN ?Phone Number: ?01/26/2022, 11:43 AM ? ? ?Clinical Narrative:    ?Patient will DC to: [facility] ?Anticipated DC date:01/26/22 ? ?Family notified:Jeff ?Transport by:EMS ? ?Per MD patient ready for DC to . RN, patient, patient's family, and facility notified of DC. Discharge Summary sent to facility. RN given number for report. DC packet on chart. Ambulance transport requested for patient.  ?TOC signing off. ? ?Isaias Cowman RNCM ?[phone] ? ? ? ?Final next level of care: [place] ?Barriers to Discharge: Barriers Resolved ? ? ?Patient Goals and CMS Choice ?Patient states their goals for this hospitalization and ongoing recovery are:: SNF ?CMS Medicare.gov Compare Post Acute Care list provided to:: Patient Represenative (must comment) ?Choice offered to / list presented to : Adult Children ? ?Discharge Placement ?  ?           ?  ?  ?  ?  ? ?Discharge Plan and Services ?  ?  ?           ?  ?  ?  ?  ?  ?  ?  ?  ?  ?  ? ?Social Determinants of Health (SDOH) Interventions ?  ? ? ?Readmission Risk Interventions ? ?  01/20/2022  ? 12:26 PM  ?Readmission Risk Prevention Plan  ?Transportation Screening Complete  ?PCP or Specialist Appt within 5-7 Days Complete  ?Home Care Screening Complete  ?Medication Review (RN CM) Complete  ? ? ? ? ? ?

## 2022-01-26 NOTE — Progress Notes (Signed)
Discharge Note  ? ?Patient discharged to [facility] via EMS. Report called to nurse at [facility]. IV removed without any difficulty. Patient discharge instructions reviewed with patient and son, who verbalized understanding of instructions.  ?

## 2022-01-29 DIAGNOSIS — M25551 Pain in right hip: Secondary | ICD-10-CM | POA: Diagnosis not present

## 2022-01-29 DIAGNOSIS — F064 Anxiety disorder due to known physiological condition: Secondary | ICD-10-CM | POA: Diagnosis not present

## 2022-01-29 DIAGNOSIS — I482 Chronic atrial fibrillation, unspecified: Secondary | ICD-10-CM | POA: Diagnosis not present

## 2022-01-29 DIAGNOSIS — F0150 Vascular dementia without behavioral disturbance: Secondary | ICD-10-CM | POA: Diagnosis not present

## 2022-01-29 DIAGNOSIS — K219 Gastro-esophageal reflux disease without esophagitis: Secondary | ICD-10-CM | POA: Diagnosis not present

## 2022-01-29 DIAGNOSIS — M25351 Other instability, right hip: Secondary | ICD-10-CM | POA: Diagnosis not present

## 2022-01-29 DIAGNOSIS — E876 Hypokalemia: Secondary | ICD-10-CM | POA: Diagnosis not present

## 2022-01-29 DIAGNOSIS — I951 Orthostatic hypotension: Secondary | ICD-10-CM | POA: Diagnosis not present

## 2022-01-29 DIAGNOSIS — R296 Repeated falls: Secondary | ICD-10-CM | POA: Diagnosis not present

## 2022-01-31 DIAGNOSIS — I951 Orthostatic hypotension: Secondary | ICD-10-CM | POA: Diagnosis not present

## 2022-01-31 DIAGNOSIS — E119 Type 2 diabetes mellitus without complications: Secondary | ICD-10-CM | POA: Diagnosis not present

## 2022-01-31 DIAGNOSIS — M6281 Muscle weakness (generalized): Secondary | ICD-10-CM | POA: Diagnosis not present

## 2022-01-31 DIAGNOSIS — E876 Hypokalemia: Secondary | ICD-10-CM | POA: Diagnosis not present

## 2022-01-31 DIAGNOSIS — M150 Primary generalized (osteo)arthritis: Secondary | ICD-10-CM | POA: Diagnosis not present

## 2022-01-31 DIAGNOSIS — K219 Gastro-esophageal reflux disease without esophagitis: Secondary | ICD-10-CM | POA: Diagnosis not present

## 2022-01-31 DIAGNOSIS — R296 Repeated falls: Secondary | ICD-10-CM | POA: Diagnosis not present

## 2022-01-31 DIAGNOSIS — I482 Chronic atrial fibrillation, unspecified: Secondary | ICD-10-CM | POA: Diagnosis not present

## 2022-01-31 DIAGNOSIS — F0150 Vascular dementia without behavioral disturbance: Secondary | ICD-10-CM | POA: Diagnosis not present

## 2022-02-02 DIAGNOSIS — R296 Repeated falls: Secondary | ICD-10-CM | POA: Diagnosis not present

## 2022-02-02 DIAGNOSIS — I951 Orthostatic hypotension: Secondary | ICD-10-CM | POA: Diagnosis not present

## 2022-02-02 DIAGNOSIS — M25551 Pain in right hip: Secondary | ICD-10-CM | POA: Diagnosis not present

## 2022-02-02 DIAGNOSIS — I482 Chronic atrial fibrillation, unspecified: Secondary | ICD-10-CM | POA: Diagnosis not present

## 2022-02-02 DIAGNOSIS — M25351 Other instability, right hip: Secondary | ICD-10-CM | POA: Diagnosis not present

## 2022-02-02 DIAGNOSIS — M6281 Muscle weakness (generalized): Secondary | ICD-10-CM | POA: Diagnosis not present

## 2022-02-02 DIAGNOSIS — E876 Hypokalemia: Secondary | ICD-10-CM | POA: Diagnosis not present

## 2022-02-02 DIAGNOSIS — K219 Gastro-esophageal reflux disease without esophagitis: Secondary | ICD-10-CM | POA: Diagnosis not present

## 2022-02-04 DIAGNOSIS — E440 Moderate protein-calorie malnutrition: Secondary | ICD-10-CM | POA: Diagnosis not present

## 2022-02-04 DIAGNOSIS — I4819 Other persistent atrial fibrillation: Secondary | ICD-10-CM | POA: Diagnosis not present

## 2022-02-05 DIAGNOSIS — I482 Chronic atrial fibrillation, unspecified: Secondary | ICD-10-CM | POA: Diagnosis not present

## 2022-02-05 DIAGNOSIS — I951 Orthostatic hypotension: Secondary | ICD-10-CM | POA: Diagnosis not present

## 2022-02-05 DIAGNOSIS — M6281 Muscle weakness (generalized): Secondary | ICD-10-CM | POA: Diagnosis not present

## 2022-02-05 DIAGNOSIS — K219 Gastro-esophageal reflux disease without esophagitis: Secondary | ICD-10-CM | POA: Diagnosis not present

## 2022-02-05 DIAGNOSIS — M25551 Pain in right hip: Secondary | ICD-10-CM | POA: Diagnosis not present

## 2022-02-05 DIAGNOSIS — M25351 Other instability, right hip: Secondary | ICD-10-CM | POA: Diagnosis not present

## 2022-02-05 DIAGNOSIS — E876 Hypokalemia: Secondary | ICD-10-CM | POA: Diagnosis not present

## 2022-02-05 DIAGNOSIS — F0150 Vascular dementia without behavioral disturbance: Secondary | ICD-10-CM | POA: Diagnosis not present

## 2022-02-07 DIAGNOSIS — E119 Type 2 diabetes mellitus without complications: Secondary | ICD-10-CM | POA: Diagnosis not present

## 2022-02-07 DIAGNOSIS — F03A4 Unspecified dementia, mild, with anxiety: Secondary | ICD-10-CM | POA: Diagnosis not present

## 2022-02-09 DIAGNOSIS — I951 Orthostatic hypotension: Secondary | ICD-10-CM | POA: Diagnosis not present

## 2022-02-09 DIAGNOSIS — F064 Anxiety disorder due to known physiological condition: Secondary | ICD-10-CM | POA: Diagnosis not present

## 2022-02-09 DIAGNOSIS — M25351 Other instability, right hip: Secondary | ICD-10-CM | POA: Diagnosis not present

## 2022-02-09 DIAGNOSIS — K219 Gastro-esophageal reflux disease without esophagitis: Secondary | ICD-10-CM | POA: Diagnosis not present

## 2022-02-09 DIAGNOSIS — M6281 Muscle weakness (generalized): Secondary | ICD-10-CM | POA: Diagnosis not present

## 2022-02-12 DIAGNOSIS — M25351 Other instability, right hip: Secondary | ICD-10-CM | POA: Diagnosis not present

## 2022-02-12 DIAGNOSIS — I482 Chronic atrial fibrillation, unspecified: Secondary | ICD-10-CM | POA: Diagnosis not present

## 2022-02-12 DIAGNOSIS — R296 Repeated falls: Secondary | ICD-10-CM | POA: Diagnosis not present

## 2022-02-12 DIAGNOSIS — F064 Anxiety disorder due to known physiological condition: Secondary | ICD-10-CM | POA: Diagnosis not present

## 2022-02-12 DIAGNOSIS — M6281 Muscle weakness (generalized): Secondary | ICD-10-CM | POA: Diagnosis not present

## 2022-02-12 DIAGNOSIS — E876 Hypokalemia: Secondary | ICD-10-CM | POA: Diagnosis not present

## 2022-02-12 DIAGNOSIS — I951 Orthostatic hypotension: Secondary | ICD-10-CM | POA: Diagnosis not present

## 2022-02-12 DIAGNOSIS — K219 Gastro-esophageal reflux disease without esophagitis: Secondary | ICD-10-CM | POA: Diagnosis not present

## 2022-02-13 DIAGNOSIS — I351 Nonrheumatic aortic (valve) insufficiency: Secondary | ICD-10-CM | POA: Diagnosis not present

## 2022-02-13 DIAGNOSIS — I10 Essential (primary) hypertension: Secondary | ICD-10-CM | POA: Diagnosis not present

## 2022-02-13 DIAGNOSIS — I4819 Other persistent atrial fibrillation: Secondary | ICD-10-CM | POA: Diagnosis not present

## 2022-02-13 DIAGNOSIS — I700 Atherosclerosis of aorta: Secondary | ICD-10-CM | POA: Diagnosis not present

## 2022-02-14 DIAGNOSIS — R296 Repeated falls: Secondary | ICD-10-CM | POA: Diagnosis not present

## 2022-02-14 DIAGNOSIS — F0280 Dementia in other diseases classified elsewhere without behavioral disturbance: Secondary | ICD-10-CM | POA: Diagnosis not present

## 2022-02-14 DIAGNOSIS — F0150 Vascular dementia without behavioral disturbance: Secondary | ICD-10-CM | POA: Diagnosis not present

## 2022-02-14 DIAGNOSIS — E559 Vitamin D deficiency, unspecified: Secondary | ICD-10-CM | POA: Diagnosis not present

## 2022-02-14 DIAGNOSIS — G309 Alzheimer's disease, unspecified: Secondary | ICD-10-CM | POA: Diagnosis not present

## 2022-02-19 DIAGNOSIS — E7800 Pure hypercholesterolemia, unspecified: Secondary | ICD-10-CM | POA: Diagnosis not present

## 2022-02-19 DIAGNOSIS — E1169 Type 2 diabetes mellitus with other specified complication: Secondary | ICD-10-CM | POA: Diagnosis not present

## 2022-02-19 DIAGNOSIS — Z862 Personal history of diseases of the blood and blood-forming organs and certain disorders involving the immune mechanism: Secondary | ICD-10-CM | POA: Diagnosis not present

## 2022-02-19 DIAGNOSIS — E785 Hyperlipidemia, unspecified: Secondary | ICD-10-CM | POA: Diagnosis not present

## 2022-02-26 DIAGNOSIS — N289 Disorder of kidney and ureter, unspecified: Secondary | ICD-10-CM | POA: Diagnosis not present

## 2022-02-26 DIAGNOSIS — I10 Essential (primary) hypertension: Secondary | ICD-10-CM | POA: Diagnosis not present

## 2022-02-26 DIAGNOSIS — G309 Alzheimer's disease, unspecified: Secondary | ICD-10-CM | POA: Diagnosis not present

## 2022-02-26 DIAGNOSIS — E7800 Pure hypercholesterolemia, unspecified: Secondary | ICD-10-CM | POA: Diagnosis not present

## 2022-02-26 DIAGNOSIS — E876 Hypokalemia: Secondary | ICD-10-CM | POA: Diagnosis not present

## 2022-02-26 DIAGNOSIS — F0280 Dementia in other diseases classified elsewhere without behavioral disturbance: Secondary | ICD-10-CM | POA: Diagnosis not present

## 2022-02-26 DIAGNOSIS — Z862 Personal history of diseases of the blood and blood-forming organs and certain disorders involving the immune mechanism: Secondary | ICD-10-CM | POA: Diagnosis not present

## 2022-02-26 DIAGNOSIS — F0150 Vascular dementia without behavioral disturbance: Secondary | ICD-10-CM | POA: Diagnosis not present

## 2022-02-26 DIAGNOSIS — F411 Generalized anxiety disorder: Secondary | ICD-10-CM | POA: Diagnosis not present

## 2022-03-06 DIAGNOSIS — I4819 Other persistent atrial fibrillation: Secondary | ICD-10-CM | POA: Diagnosis not present

## 2022-03-13 DIAGNOSIS — I10 Essential (primary) hypertension: Secondary | ICD-10-CM | POA: Diagnosis not present

## 2022-03-13 DIAGNOSIS — I4819 Other persistent atrial fibrillation: Secondary | ICD-10-CM | POA: Diagnosis not present

## 2022-03-13 DIAGNOSIS — I351 Nonrheumatic aortic (valve) insufficiency: Secondary | ICD-10-CM | POA: Diagnosis not present

## 2022-03-20 DIAGNOSIS — I4819 Other persistent atrial fibrillation: Secondary | ICD-10-CM | POA: Diagnosis not present

## 2022-03-20 DIAGNOSIS — I951 Orthostatic hypotension: Secondary | ICD-10-CM | POA: Diagnosis not present

## 2022-03-20 DIAGNOSIS — E119 Type 2 diabetes mellitus without complications: Secondary | ICD-10-CM | POA: Diagnosis not present

## 2022-03-20 DIAGNOSIS — I080 Rheumatic disorders of both mitral and aortic valves: Secondary | ICD-10-CM | POA: Diagnosis not present

## 2022-03-20 DIAGNOSIS — I10 Essential (primary) hypertension: Secondary | ICD-10-CM | POA: Diagnosis not present

## 2022-03-20 DIAGNOSIS — K5790 Diverticulosis of intestine, part unspecified, without perforation or abscess without bleeding: Secondary | ICD-10-CM | POA: Diagnosis not present

## 2022-03-20 DIAGNOSIS — J439 Emphysema, unspecified: Secondary | ICD-10-CM | POA: Diagnosis not present

## 2022-04-27 DIAGNOSIS — M25511 Pain in right shoulder: Secondary | ICD-10-CM | POA: Diagnosis not present

## 2022-04-27 DIAGNOSIS — M79601 Pain in right arm: Secondary | ICD-10-CM | POA: Diagnosis not present

## 2022-04-27 DIAGNOSIS — S40011A Contusion of right shoulder, initial encounter: Secondary | ICD-10-CM | POA: Diagnosis not present

## 2022-04-27 DIAGNOSIS — W01198A Fall on same level from slipping, tripping and stumbling with subsequent striking against other object, initial encounter: Secondary | ICD-10-CM | POA: Diagnosis not present

## 2022-04-27 DIAGNOSIS — G309 Alzheimer's disease, unspecified: Secondary | ICD-10-CM | POA: Diagnosis not present

## 2022-04-27 DIAGNOSIS — F0150 Vascular dementia without behavioral disturbance: Secondary | ICD-10-CM | POA: Diagnosis not present

## 2022-04-27 DIAGNOSIS — F0280 Dementia in other diseases classified elsewhere without behavioral disturbance: Secondary | ICD-10-CM | POA: Diagnosis not present

## 2022-05-07 DIAGNOSIS — E7800 Pure hypercholesterolemia, unspecified: Secondary | ICD-10-CM | POA: Diagnosis not present

## 2022-05-07 DIAGNOSIS — I10 Essential (primary) hypertension: Secondary | ICD-10-CM | POA: Diagnosis not present

## 2022-05-07 DIAGNOSIS — F411 Generalized anxiety disorder: Secondary | ICD-10-CM | POA: Diagnosis not present

## 2022-05-11 DIAGNOSIS — M25551 Pain in right hip: Secondary | ICD-10-CM | POA: Diagnosis not present

## 2022-05-11 DIAGNOSIS — M8588 Other specified disorders of bone density and structure, other site: Secondary | ICD-10-CM | POA: Diagnosis not present

## 2022-05-11 DIAGNOSIS — M545 Low back pain, unspecified: Secondary | ICD-10-CM | POA: Diagnosis not present

## 2022-05-11 DIAGNOSIS — M25562 Pain in left knee: Secondary | ICD-10-CM | POA: Diagnosis not present

## 2022-05-11 DIAGNOSIS — W010XXA Fall on same level from slipping, tripping and stumbling without subsequent striking against object, initial encounter: Secondary | ICD-10-CM | POA: Diagnosis not present

## 2022-05-17 DIAGNOSIS — G309 Alzheimer's disease, unspecified: Secondary | ICD-10-CM | POA: Diagnosis not present

## 2022-05-17 DIAGNOSIS — I4819 Other persistent atrial fibrillation: Secondary | ICD-10-CM | POA: Diagnosis not present

## 2022-05-17 DIAGNOSIS — S40011D Contusion of right shoulder, subsequent encounter: Secondary | ICD-10-CM | POA: Diagnosis not present

## 2022-05-17 DIAGNOSIS — G8911 Acute pain due to trauma: Secondary | ICD-10-CM | POA: Diagnosis not present

## 2022-05-17 DIAGNOSIS — F0154 Vascular dementia, unspecified severity, with anxiety: Secondary | ICD-10-CM | POA: Diagnosis not present

## 2022-05-17 DIAGNOSIS — F411 Generalized anxiety disorder: Secondary | ICD-10-CM | POA: Diagnosis not present

## 2022-05-17 DIAGNOSIS — F0284 Dementia in other diseases classified elsewhere, unspecified severity, with anxiety: Secondary | ICD-10-CM | POA: Diagnosis not present

## 2022-05-18 DIAGNOSIS — I10 Essential (primary) hypertension: Secondary | ICD-10-CM | POA: Diagnosis not present

## 2022-05-18 DIAGNOSIS — F0150 Vascular dementia without behavioral disturbance: Secondary | ICD-10-CM | POA: Diagnosis not present

## 2022-05-18 DIAGNOSIS — Z9181 History of falling: Secondary | ICD-10-CM | POA: Diagnosis not present

## 2022-05-18 DIAGNOSIS — G309 Alzheimer's disease, unspecified: Secondary | ICD-10-CM | POA: Diagnosis not present

## 2022-05-18 DIAGNOSIS — F0280 Dementia in other diseases classified elsewhere without behavioral disturbance: Secondary | ICD-10-CM | POA: Diagnosis not present

## 2022-06-15 DIAGNOSIS — N289 Disorder of kidney and ureter, unspecified: Secondary | ICD-10-CM | POA: Diagnosis not present

## 2022-06-15 DIAGNOSIS — E876 Hypokalemia: Secondary | ICD-10-CM | POA: Diagnosis not present

## 2022-06-15 DIAGNOSIS — Z862 Personal history of diseases of the blood and blood-forming organs and certain disorders involving the immune mechanism: Secondary | ICD-10-CM | POA: Diagnosis not present

## 2022-06-15 DIAGNOSIS — E7800 Pure hypercholesterolemia, unspecified: Secondary | ICD-10-CM | POA: Diagnosis not present

## 2022-06-15 DIAGNOSIS — I10 Essential (primary) hypertension: Secondary | ICD-10-CM | POA: Diagnosis not present

## 2022-06-18 DIAGNOSIS — Z0000 Encounter for general adult medical examination without abnormal findings: Secondary | ICD-10-CM | POA: Diagnosis not present

## 2022-06-18 DIAGNOSIS — Z862 Personal history of diseases of the blood and blood-forming organs and certain disorders involving the immune mechanism: Secondary | ICD-10-CM | POA: Diagnosis not present

## 2022-06-18 DIAGNOSIS — E7800 Pure hypercholesterolemia, unspecified: Secondary | ICD-10-CM | POA: Diagnosis not present

## 2022-06-18 DIAGNOSIS — R7401 Elevation of levels of liver transaminase levels: Secondary | ICD-10-CM | POA: Diagnosis not present

## 2022-06-18 DIAGNOSIS — F411 Generalized anxiety disorder: Secondary | ICD-10-CM | POA: Diagnosis not present

## 2022-06-18 DIAGNOSIS — I10 Essential (primary) hypertension: Secondary | ICD-10-CM | POA: Diagnosis not present

## 2022-06-18 DIAGNOSIS — I4819 Other persistent atrial fibrillation: Secondary | ICD-10-CM | POA: Diagnosis not present

## 2022-06-18 DIAGNOSIS — E876 Hypokalemia: Secondary | ICD-10-CM | POA: Diagnosis not present

## 2022-06-21 DIAGNOSIS — E113293 Type 2 diabetes mellitus with mild nonproliferative diabetic retinopathy without macular edema, bilateral: Secondary | ICD-10-CM | POA: Diagnosis not present

## 2022-07-13 DIAGNOSIS — R7401 Elevation of levels of liver transaminase levels: Secondary | ICD-10-CM | POA: Diagnosis not present

## 2022-07-17 ENCOUNTER — Other Ambulatory Visit: Payer: Self-pay | Admitting: Family Medicine

## 2022-07-17 DIAGNOSIS — R7401 Elevation of levels of liver transaminase levels: Secondary | ICD-10-CM

## 2022-07-24 ENCOUNTER — Ambulatory Visit
Admission: RE | Admit: 2022-07-24 | Discharge: 2022-07-24 | Disposition: A | Discharge status: Home / Self Care | Payer: Medicare HMO | Source: Ambulatory Visit | Attending: Family Medicine | Admitting: Family Medicine

## 2022-07-24 DIAGNOSIS — R7401 Elevation of levels of liver transaminase levels: Secondary | ICD-10-CM | POA: Diagnosis not present

## 2022-07-24 DIAGNOSIS — R748 Abnormal levels of other serum enzymes: Secondary | ICD-10-CM | POA: Diagnosis not present

## 2022-07-24 DIAGNOSIS — K8020 Calculus of gallbladder without cholecystitis without obstruction: Secondary | ICD-10-CM | POA: Diagnosis not present

## 2022-07-24 DIAGNOSIS — Z1159 Encounter for screening for other viral diseases: Secondary | ICD-10-CM | POA: Diagnosis not present

## 2022-07-27 ENCOUNTER — Emergency Department: Payer: Medicare HMO

## 2022-07-27 ENCOUNTER — Other Ambulatory Visit: Payer: Self-pay

## 2022-07-27 ENCOUNTER — Observation Stay
Admission: EM | Admit: 2022-07-27 | Discharge: 2022-07-31 | Disposition: A | Discharge status: Skilled Nursing Facility | Payer: Medicare HMO | Attending: Internal Medicine | Admitting: Internal Medicine

## 2022-07-27 DIAGNOSIS — Z87891 Personal history of nicotine dependence: Secondary | ICD-10-CM | POA: Diagnosis not present

## 2022-07-27 DIAGNOSIS — I4891 Unspecified atrial fibrillation: Secondary | ICD-10-CM | POA: Insufficient documentation

## 2022-07-27 DIAGNOSIS — E876 Hypokalemia: Secondary | ICD-10-CM | POA: Insufficient documentation

## 2022-07-27 DIAGNOSIS — W0110XA Fall on same level from slipping, tripping and stumbling with subsequent striking against unspecified object, initial encounter: Secondary | ICD-10-CM | POA: Diagnosis not present

## 2022-07-27 DIAGNOSIS — R7401 Elevation of levels of liver transaminase levels: Secondary | ICD-10-CM | POA: Insufficient documentation

## 2022-07-27 DIAGNOSIS — I10 Essential (primary) hypertension: Secondary | ICD-10-CM | POA: Insufficient documentation

## 2022-07-27 DIAGNOSIS — S0101XA Laceration without foreign body of scalp, initial encounter: Secondary | ICD-10-CM | POA: Diagnosis not present

## 2022-07-27 DIAGNOSIS — D696 Thrombocytopenia, unspecified: Secondary | ICD-10-CM | POA: Diagnosis not present

## 2022-07-27 DIAGNOSIS — Z79899 Other long term (current) drug therapy: Secondary | ICD-10-CM | POA: Diagnosis not present

## 2022-07-27 DIAGNOSIS — J449 Chronic obstructive pulmonary disease, unspecified: Secondary | ICD-10-CM | POA: Diagnosis not present

## 2022-07-27 DIAGNOSIS — R9431 Abnormal electrocardiogram [ECG] [EKG]: Secondary | ICD-10-CM

## 2022-07-27 DIAGNOSIS — I4892 Unspecified atrial flutter: Secondary | ICD-10-CM | POA: Diagnosis not present

## 2022-07-27 DIAGNOSIS — S0990XA Unspecified injury of head, initial encounter: Secondary | ICD-10-CM

## 2022-07-27 DIAGNOSIS — R296 Repeated falls: Secondary | ICD-10-CM | POA: Diagnosis not present

## 2022-07-27 DIAGNOSIS — F0390 Unspecified dementia without behavioral disturbance: Secondary | ICD-10-CM | POA: Insufficient documentation

## 2022-07-27 DIAGNOSIS — W19XXXA Unspecified fall, initial encounter: Secondary | ICD-10-CM | POA: Diagnosis not present

## 2022-07-27 DIAGNOSIS — Z85828 Personal history of other malignant neoplasm of skin: Secondary | ICD-10-CM | POA: Diagnosis not present

## 2022-07-27 DIAGNOSIS — E440 Moderate protein-calorie malnutrition: Secondary | ICD-10-CM | POA: Diagnosis not present

## 2022-07-27 DIAGNOSIS — Y92129 Unspecified place in nursing home as the place of occurrence of the external cause: Secondary | ICD-10-CM | POA: Insufficient documentation

## 2022-07-27 DIAGNOSIS — E119 Type 2 diabetes mellitus without complications: Secondary | ICD-10-CM | POA: Diagnosis not present

## 2022-07-27 DIAGNOSIS — R531 Weakness: Secondary | ICD-10-CM | POA: Diagnosis not present

## 2022-07-27 DIAGNOSIS — I4581 Long QT syndrome: Secondary | ICD-10-CM | POA: Diagnosis not present

## 2022-07-27 DIAGNOSIS — S12600A Unspecified displaced fracture of seventh cervical vertebra, initial encounter for closed fracture: Secondary | ICD-10-CM | POA: Insufficient documentation

## 2022-07-27 DIAGNOSIS — K219 Gastro-esophageal reflux disease without esophagitis: Secondary | ICD-10-CM | POA: Diagnosis present

## 2022-07-27 LAB — COMPREHENSIVE METABOLIC PANEL
ALT: 112 U/L — ABNORMAL HIGH (ref 0–44)
AST: 133 U/L — ABNORMAL HIGH (ref 15–41)
Albumin: 3.5 g/dL (ref 3.5–5.0)
Alkaline Phosphatase: 66 U/L (ref 38–126)
Anion gap: 11 (ref 5–15)
BUN: 17 mg/dL (ref 8–23)
CO2: 28 mmol/L (ref 22–32)
Calcium: 8.5 mg/dL — ABNORMAL LOW (ref 8.9–10.3)
Chloride: 98 mmol/L (ref 98–111)
Creatinine, Ser: 0.98 mg/dL (ref 0.44–1.00)
GFR, Estimated: 57 mL/min — ABNORMAL LOW (ref >60)
Glucose, Bld: 115 mg/dL — ABNORMAL HIGH (ref 70–99)
Potassium: 2.6 mmol/L — CL (ref 3.5–5.1)
Sodium: 137 mmol/L (ref 135–145)
Total Bilirubin: 1.1 mg/dL (ref 0.3–1.2)
Total Protein: 6.4 g/dL — ABNORMAL LOW (ref 6.5–8.1)

## 2022-07-27 LAB — CBC
HCT: 38.2 % (ref 36.0–46.0)
Hemoglobin: 12.4 g/dL (ref 12.0–15.0)
MCH: 30.1 pg (ref 26.0–34.0)
MCHC: 32.5 g/dL (ref 30.0–36.0)
MCV: 92.7 fL (ref 80.0–100.0)
Platelets: 149 10*3/uL — ABNORMAL LOW (ref 150–400)
RBC: 4.12 MIL/uL (ref 3.87–5.11)
RDW: 14.4 % (ref 11.5–15.5)
WBC: 9.7 10*3/uL (ref 4.0–10.5)
nRBC: 0 % (ref 0.0–0.2)

## 2022-07-27 LAB — MAGNESIUM: Magnesium: 1.5 mg/dL — ABNORMAL LOW (ref 1.7–2.4)

## 2022-07-27 LAB — TROPONIN I (HIGH SENSITIVITY): Troponin I (High Sensitivity): 17 ng/L (ref <18)

## 2022-07-27 MED ORDER — POTASSIUM CHLORIDE IN NACL 20-0.9 MEQ/L-% IV SOLN
INTRAVENOUS | Status: DC
  Filled 2022-07-27: qty 1000

## 2022-07-27 MED ORDER — ENOXAPARIN SODIUM 40 MG/0.4ML IJ SOSY
40.0000 mg | PREFILLED_SYRINGE | INTRAMUSCULAR | Status: DC
  Administered 2022-07-28 – 2022-07-31 (×4): 40 mg via SUBCUTANEOUS
  Filled 2022-07-27 (×4): qty 0.4

## 2022-07-27 MED ORDER — MAGNESIUM SULFATE 2 GM/50ML IV SOLN
2.0000 g | Freq: Once | INTRAVENOUS | Status: AC
  Administered 2022-07-28: 2 g via INTRAVENOUS
  Filled 2022-07-27: qty 50

## 2022-07-27 MED ORDER — SODIUM CHLORIDE 0.9 % IV BOLUS
1000.0000 mL | Freq: Once | INTRAVENOUS | Status: AC
  Administered 2022-07-27: 1000 mL via INTRAVENOUS

## 2022-07-27 MED ORDER — POTASSIUM CHLORIDE 10 MEQ/100ML IV SOLN
10.0000 meq | Freq: Once | INTRAVENOUS | Status: AC
  Administered 2022-07-27: 10 meq via INTRAVENOUS
  Filled 2022-07-27: qty 100

## 2022-07-27 MED ORDER — POTASSIUM CHLORIDE CRYS ER 20 MEQ PO TBCR
40.0000 meq | EXTENDED_RELEASE_TABLET | Freq: Once | ORAL | Status: AC
  Administered 2022-07-27: 40 meq via ORAL
  Filled 2022-07-27: qty 2

## 2022-07-27 MED ORDER — AMIODARONE HCL 200 MG PO TABS
200.0000 mg | ORAL_TABLET | Freq: Two times a day (BID) | ORAL | Status: DC
  Administered 2022-07-28 – 2022-07-31 (×7): 200 mg via ORAL
  Filled 2022-07-27 (×7): qty 1

## 2022-07-27 MED ORDER — ALENDRONATE SODIUM 70 MG PO TABS: 70.0000 mg | ORAL_TABLET | ORAL | Status: DC

## 2022-07-27 MED ORDER — VITAMIN C 500 MG PO TABS
500.0000 mg | ORAL_TABLET | Freq: Two times a day (BID) | ORAL | Status: DC
  Administered 2022-07-28 – 2022-07-31 (×7): 500 mg via ORAL
  Filled 2022-07-27 (×8): qty 1

## 2022-07-27 MED ORDER — CYANOCOBALAMIN 500 MCG PO TABS
500.0000 ug | ORAL_TABLET | Freq: Every day | ORAL | Status: DC
  Administered 2022-07-29 – 2022-07-31 (×3): 500 ug via ORAL
  Filled 2022-07-27 (×4): qty 1

## 2022-07-27 MED ORDER — ACETAMINOPHEN 650 MG RE SUPP: 650.0000 mg | Freq: Four times a day (QID) | RECTAL | Status: DC | PRN

## 2022-07-27 MED ORDER — METOPROLOL SUCCINATE ER 25 MG PO TB24
25.0000 mg | ORAL_TABLET | Freq: Two times a day (BID) | ORAL | Status: DC
  Administered 2022-07-28 – 2022-07-31 (×7): 25 mg via ORAL
  Filled 2022-07-27 (×8): qty 1

## 2022-07-27 MED ORDER — MAGNESIUM HYDROXIDE 400 MG/5ML PO SUSP: 30.0000 mL | Freq: Every day | ORAL | Status: DC | PRN

## 2022-07-27 MED ORDER — TRAZODONE HCL 50 MG PO TABS
25.0000 mg | ORAL_TABLET | Freq: Every evening | ORAL | Status: DC | PRN
  Administered 2022-07-28 – 2022-07-30 (×2): 25 mg via ORAL
  Filled 2022-07-27 (×2): qty 1

## 2022-07-27 MED ORDER — PANTOPRAZOLE SODIUM 20 MG PO TBEC
20.0000 mg | DELAYED_RELEASE_TABLET | Freq: Every day | ORAL | Status: DC
  Administered 2022-07-29 – 2022-07-30 (×2): 20 mg via ORAL
  Filled 2022-07-27 (×5): qty 1

## 2022-07-27 MED ORDER — OYSTER SHELL CALCIUM/D3 500-5 MG-MCG PO TABS
1.0000 | ORAL_TABLET | Freq: Two times a day (BID) | ORAL | Status: DC
  Administered 2022-07-28 – 2022-07-31 (×7): 1 via ORAL
  Filled 2022-07-27 (×8): qty 1

## 2022-07-27 MED ORDER — ESCITALOPRAM OXALATE 10 MG PO TABS
5.0000 mg | ORAL_TABLET | Freq: Every day | ORAL | Status: DC
  Administered 2022-07-28 – 2022-07-31 (×4): 5 mg via ORAL
  Filled 2022-07-27 (×4): qty 1

## 2022-07-27 MED ORDER — POTASSIUM CHLORIDE CRYS ER 20 MEQ PO TBCR: 20.0000 meq | EXTENDED_RELEASE_TABLET | Freq: Two times a day (BID) | ORAL | Status: DC

## 2022-07-27 MED ORDER — PRAVASTATIN SODIUM 20 MG PO TABS
40.0000 mg | ORAL_TABLET | Freq: Every day | ORAL | Status: DC
  Administered 2022-07-28: 40 mg via ORAL
  Filled 2022-07-27 (×2): qty 2

## 2022-07-27 MED ORDER — ENALAPRIL MALEATE 20 MG PO TABS
20.0000 mg | ORAL_TABLET | Freq: Every day | ORAL | Status: DC
  Administered 2022-07-28 – 2022-07-31 (×4): 20 mg via ORAL
  Filled 2022-07-27 (×2): qty 1
  Filled 2022-07-27: qty 2
  Filled 2022-07-27: qty 1

## 2022-07-27 MED ORDER — ACETAMINOPHEN 325 MG PO TABS: 650.0000 mg | ORAL_TABLET | Freq: Four times a day (QID) | ORAL | Status: DC | PRN

## 2022-07-27 NOTE — Assessment & Plan Note (Signed)
-   Aggressive potassium replacement will be pursued. - We will follow potassium level.

## 2022-07-27 NOTE — ED Notes (Signed)
Door opened to room. Family has stepped out for the moment.

## 2022-07-27 NOTE — ED Triage Notes (Signed)
Mechanical fall at [facility] per EMS report. Pt appears to have fallen backwards and struck posterior aspect of skull. Laceration noted with no active bleeding at this time. Pt takes daily baby ASA. Pt with hx of dementia and is at neuro baseline per EMS report from facility. Pt alert and following commands appropriately. Denies h/a or dizziness. Pupils equal and reactive. Denies n/v or parasthesia. Pt noted to be breathing unlabored, speaking in full sentences with symmetric chest and fall.

## 2022-07-27 NOTE — Assessment & Plan Note (Addendum)
-   We will continue amiodarone, Toprol-XL and aspirin.

## 2022-07-27 NOTE — ED Triage Notes (Signed)
First Nurse Note: Pt presents via EMS from [facility] following a fall around 1830. Pt states she tripped over her bed causing her to fall - small lac to the posterior head,  bleeding controlled PTA. Takes ASA daily. No LOC.   Hx of Dementia - Per EMS pt has a DNR but facility could not find it.

## 2022-07-27 NOTE — Assessment & Plan Note (Signed)
-   We will continue PPI therapy 

## 2022-07-27 NOTE — Assessment & Plan Note (Addendum)
-   The patient has subsequeildly mildly displaced fracture involving the spinous process of C7 extending to the margin of the inferior facet on the right side at C7. She will be admitted to a medical telemetry observation bed. - PT consult will be obtained. - Pain management will be given if needed.  At this time she has no neck pain or pain elsewhere.

## 2022-07-27 NOTE — ED Provider Notes (Signed)
[facility] Provider Note    Event Date/Time   First MD Initiated Contact with Patient 07/27/22 2025     (approximate)   History   Fall and Laceration   HPI  Terry Kaiser is a 86 y.o. female with past medical history significant for COPD, dementia, diabetes, hypertension, presents to the emergency department following a fall.  Patient had an unwitnessed fall.  She is uncertain of what caused her to fall.  She did have head injury but uncertain loss of consciousness.  Patient's son who is at bedside states that she has been at [facility].  States that over the past couple of months she has had more frequent falls with generalized weakness.  She has had decreased p.o. intake over the past couple of weeks.  Patient denies any burning with urination.  No complaints at this time.  Denies recent changes of pulm medications.  States that she did have a fall approximately 1 month ago and was having severe neck pain at that time but has not had any neck pain since the past 1 to 2 weeks.      Physical Exam   Triage Vital Signs: ED Triage Vitals  Enc Vitals Group     BP 07/27/22 1930 136/66     Pulse Rate 07/27/22 1930 62     Resp 07/27/22 1930 18     Temp 07/27/22 1930 98.3 F (36.8 C)     Temp Source 07/27/22 1930 Oral     SpO2 07/27/22 1930 99 %     Weight 07/27/22 1934 140 lb (63.5 kg)     Height 07/27/22 1933 '5\' 6"'$  (1.676 m)     Head Circumference --      Peak Flow --      Pain Score 07/27/22 1933 0     Pain Loc --      Pain Edu? --      Excl. in [place]? --     Most recent vital signs: Vitals:   07/27/22 1930 07/27/22 2218  BP: 136/66 (!) 172/71  Pulse: 62 (!) 57  Resp: 18 14  Temp: 98.3 F (36.8 C) 98 F (36.7 C)  SpO2: 99% 99%    Physical Exam HENT:     Head:     Comments: Abrasion to the occipital scalp, hemostatic, no laceration    Right Ear: External ear normal.     Left Ear: External ear normal.  Eyes:     Extraocular  Movements: Extraocular movements intact.     Pupils: Pupils are equal, round, and reactive to light.  Neck:     Comments: No midline cervical spine tenderness to palpation Cardiovascular:     Rate and Rhythm: Normal rate.  Pulmonary:     Effort: No respiratory distress.  Abdominal:     General: There is no distension.  Musculoskeletal:        General: No tenderness. Normal range of motion.     Cervical back: No tenderness.  Neurological:     General: No focal deficit present.     Mental Status: She is alert.     Comments: Moving all extremities.  Following simple commands.  Able to stand at bedside.          IMPRESSION / MDM / ASSESSMENT AND PLAN / ED COURSE  I reviewed the triage vital signs and the nursing notes.  Differential diagnosis including infectious process, electrolyte abnormality, dehydration, progression of dementia, intracranial hemorrhage.  Plan  for CT imaging of CT cervical spine and head.  We will obtain lab work.  We will also obtain a UA however patient just urinated prior to my evaluation.     EKG  EKG with prolonged QTc in the 600s.  Nonspecific ST changes.  No significant ST elevation.  Prolonged QTc when compared to prior EKG.  No chamber enlargement.  No tachycardic or bradycardic dysrhythmias while on cardiac telemetry in the emergency department  RADIOLOGY I independently reviewed imaging, my interpretation of imaging: CT scan of the head without signs of intracranial hemorrhage.  CT scan of the neck with fracture that appears to be healing.  CT scan was read as no acute findings of the head.  C7 fracture of the superior endplate with some calcifications concerning for possible subacute versus acute  After further conversation with the patient she is not tender in the midline, have a low suspicion for an acute cervical spine fracture.    ED Results / Procedures / Treatments   Labs (all labs ordered are listed, but only abnormal results are  displayed) Labs interpreted as -   Patient with hypokalemia with potassium level of 2.6.  Calcium is mildly low at 8.5.  Creatinine appears to be at her baseline.  Platelets chronically low.  UA currently pending.  Added on a magnesium level.  Labs Reviewed  CBC - Abnormal; Notable for the following components:      Result Value   Platelets 149 (*)    All other components within normal limits  COMPREHENSIVE METABOLIC PANEL - Abnormal; Notable for the following components:   Potassium 2.6 (*)    Glucose, Bld 115 (*)    Calcium 8.5 (*)    Total Protein 6.4 (*)    AST 133 (*)    ALT 112 (*)    GFR, Estimated 57 (*)    All other components within normal limits  MAGNESIUM - Abnormal; Notable for the following components:   Magnesium 1.5 (*)    All other components within normal limits  URINALYSIS, ROUTINE W REFLEX MICROSCOPIC  TROPONIN I (HIGH SENSITIVITY)    Patient was given IV and p.o. potassium.  Given IV fluids.  Consult to the hospitalist for admission for generalized weakness secondary to hypokalemia with prolonged QTc.   PROCEDURES:  Critical Care performed: Yes  Procedures  Total critical care time: 35 minutes  Critical care time was exclusive of separately billable procedures and treating other patients.  Critical care was necessary to treat or prevent imminent or life-threatening deterioration.  Critical care was time spent personally by me on the following activities: development of treatment plan with patient and/or surrogate as well as nursing, discussions with consultants, evaluation of patient's response to treatment, examination of patient, obtaining history from patient or surrogate, ordering and performing treatments and interventions, ordering and review of laboratory studies, ordering and review of radiographic studies, pulse oximetry and re-evaluation of patient's condition.   Patient's presentation is most consistent with acute presentation with  potential threat to life or bodily function.   MEDICATIONS ORDERED IN ED: Medications  potassium chloride 10 mEq in 100 mL IVPB (has no administration in time range)  sodium chloride 0.9 % bolus 1,000 mL (has no administration in time range)  potassium chloride SA (KLOR-CON M) CR tablet 40 mEq (has no administration in time range)    FINAL CLINICAL IMPRESSION(S) / ED DIAGNOSES   Final diagnoses:  Injury of head, initial encounter  Hypokalemia  Prolonged Q-T interval on  ECG  Weakness     Rx / DC Orders   ED Discharge Orders     None        Note:  This document was prepared using Dragon voice recognition software and may include unintentional dictation errors.   Nathaniel Man, MD 07/27/22 2315

## 2022-07-27 NOTE — H&P (Incomplete)
[place]   PATIENT NAME: Terry Kaiser    MR#:  [id number]  DATE OF BIRTH:  [birth year]  DATE OF ADMISSION:  07/27/2022  PRIMARY CARE PHYSICIAN: Dion Body, MD   Patient is coming from: Home  REQUESTING/REFERRING PHYSICIAN: Nathaniel Man, MD  CHIEF COMPLAINT:   Chief Complaint  Patient presents with   Fall   Laceration    HISTORY OF PRESENT ILLNESS:  Terry Kaiser is a 86 y.o. Caucasian female with medical history significant for type 2 diabetes mellitus, GERD, hypertension and osteoarthritis, who presented to the emergency room with acute onset of unwitnessed fall.  The patient denied any presyncope or syncope or head injury.  He is not sure how she fell.  She has been having recurrent falls lately and generalized weakness per her son, over the last couple of months.  She sustained a posterior scalp laceration at this time that was managed by pressure with adequate hemostasis.  No chest pain or dyspnea or palpitations.  No paresthesias or focal muscle weakness.  No dysuria, dysuria or hematuria, urgency or frequency or flank pain.  About a month ago she fell and had severe neck pain at that time and since then has not had any neck pain at least over the last 1 to 2 weeks.  ED Course: When she came to the ER, Vital signs were within normal.  Labs revealed hypokalemia of 2.6 and elevated AST of 133, ALT of 112 and her magnesium was low at 1.5.  Total protein was 6.4 with albumin of 3.5.  High sensitive troponin I was 17 and CBC showed mild thrombocytopenia of 149 and was otherwise unremarkable. EKG as reviewed by me : EKG showed atrial flutter with variable AV block and a rate of 63 with prolonged QT interval with QTc 654 MS and T wave inversion inferiorly. Imaging: Noncontrasted CT scan revealed atrophy and mild chronic small vessel ischemic changes of the white matter with no acute intracranial abnormality.  C-spine CT showed mildly displaced fracture involving the  spinous process of C7 extending to the margin of the inferior facet on the right side at C7 with questionable small amount of periosteal new bone formation on some of the axial images raising the possibility of subacute process.  The patient was given 40 mill [place] p.o. potassium chloride and 10 mill equivalent IV as well as 1 L bolus of IV normal saline. PAST MEDICAL HISTORY:   Past Medical History:  Diagnosis Date   Arthritis    joints   Borderline diabetes mellitus 06/22/15   A1c 6.1%; diet controlled   Cancer ([place])    skin   Colon polyps    Diabetes mellitus without complication (HCC)    diet controlled, type 2   Diverticulosis    GERD (gastroesophageal reflux disease)    without esophagitis   HOH (hard of hearing)    aids   Hypertension    controlled on meds   Osteoporosis    Pure hypercholesterolemia    Seasonal allergies     PAST SURGICAL HISTORY:   Past Surgical History:  Procedure Laterality Date   BREAST CYST ASPIRATION Left 1992   benign   BREAST EXCISIONAL BIOPSY Right 2000   benign   CARDIOVERSION N/A 03/22/2021   Procedure: CARDIOVERSION;  Surgeon: Corey Skains, MD;  Location: [facility] ORS;  Service: Cardiovascular;  Laterality: N/A;   CATARACT EXTRACTION W/PHACO Left 02/21/2015   Procedure: CATARACT EXTRACTION PHACO AND INTRAOCULAR LENS PLACEMENT ([place]);  Surgeon: Estill Cotta, MD;  Location: [facility] ORS;  Service: Ophthalmology;  Laterality: Left;   00:59 AP% 25.2 CDE 24.66   CATARACT EXTRACTION W/PHACO Right 02/26/2018   Procedure: CATARACT EXTRACTION PHACO AND INTRAOCULAR LENS PLACEMENT ([place])  DIABETIC RIGHT;  Surgeon: Leandrew Koyanagi, MD;  Location: [facility];  Service: Ophthalmology;  Laterality: Right;  Diabetes-diet controlled   COLONOSCOPY WITH PROPOFOL N/A 12/22/2015   Procedure: COLONOSCOPY WITH PROPOFOL;  Surgeon: Manya Silvas, MD;  Location: [facility];  Service: Endoscopy;  Laterality: N/A;   DILATION AND CURETTAGE OF  UTERUS      SOCIAL HISTORY:   Social History   Tobacco Use   Smoking status: Former   Smokeless tobacco: Never  Substance Use Topics   Alcohol use: No    FAMILY HISTORY:   Family History  Problem Relation Age of Onset   Breast cancer Maternal Aunt 60    DRUG ALLERGIES:  No Known Allergies  REVIEW OF SYSTEMS:   ROS As per history of present illness. All pertinent systems were reviewed above. Constitutional, HEENT, cardiovascular, respiratory, GI, GU, musculoskeletal, neuro, psychiatric, endocrine, integumentary and hematologic systems were reviewed and are otherwise negative/unremarkable except for positive findings mentioned above in the HPI.   MEDICATIONS AT HOME:   Prior to Admission medications   Medication Sig Start Date End Date Taking? Authorizing Provider  alendronate (FOSAMAX) 70 MG tablet Take 70 mg by mouth once a week. Take with a full glass of water on an empty stomach.    [provider]  amiodarone (PACERONE) 200 MG tablet Take 200 mg by mouth 2 (two) times daily. 03/07/21 03/07/22  [provider]  Calcium Carb-Cholecalciferol 600-400 MG-UNIT TABS Take 1 tablet by mouth 2 (two) times daily.    [provider]  enalapril (VASOTEC) 20 MG tablet Take 20 mg by mouth daily.    [provider]  escitalopram (LEXAPRO) 5 MG tablet Take 5 mg by mouth daily. 01/17/22   [provider]  lovastatin (MEVACOR) 40 MG tablet Take 40 mg by mouth at bedtime.    [provider]  metoprolol succinate (TOPROL-XL) 25 MG 24 hr tablet Take 25 mg by mouth 2 (two) times daily. Take with or immediately following a meal.    [provider]  pantoprazole (PROTONIX) 20 MG tablet Take 20 mg by mouth daily. dinner    [provider]  potassium chloride (K-DUR,KLOR-CON) 10 MEQ tablet Take 20 mEq by mouth 2 (two) times daily.     [provider]  vitamin B-12 (CYANOCOBALAMIN) 500 MCG tablet Take 500 mcg by mouth  daily.    [provider]  vitamin C (ASCORBIC ACID) 500 MG tablet Take 500 mg by mouth 2 (two) times daily.     [provider]      VITAL SIGNS:  Blood pressure (!) 188/80, pulse (!) 59, temperature 98 F (36.7 C), temperature source Oral, resp. rate 18, height '5\' 6"'$  (1.676 m), weight 63.5 kg, SpO2 100 %.  PHYSICAL EXAMINATION:  Physical Exam  GENERAL:  86 y.o.-year-old Caucasian female patient lying in the bed with no acute distress.  EYES: Pupils equal, round, reactive to light and accommodation. No scleral icterus. Extraocular muscles intact.  HEENT: Head atraumatic, normocephalic. Oropharynx and nasopharynx clear.  NECK:  Supple, no jugular venous distention. No thyroid enlargement, no tenderness.  LUNGS: Normal breath sounds bilaterally, no wheezing, rales,rhonchi or crepitation. No use of accessory muscles of respiration.  CARDIOVASCULAR: Regular rate and rhythm, S1,  S2 normal. No murmurs, rubs, or gallops.  ABDOMEN: Soft, nondistended, nontender. Bowel sounds present. No organomegaly or mass.  EXTREMITIES: No pedal edema, cyanosis, or clubbing.  NEUROLOGIC: Cranial nerves II through XII are intact. Muscle strength 5/5 in all extremities. Sensation intact. Gait not checked. Musculoskeletal: No C-spine tenderness. PSYCHIATRIC: The patient is alert and oriented x 3.  Normal affect and good eye contact. SKIN: No obvious rash, lesion, or ulcer.   LABORATORY PANEL:   CBC Recent Labs  Lab 07/27/22 2157  WBC 9.7  HGB 12.4  HCT 38.2  PLT 149*   ------------------------------------------------------------------------------------------------------------------  Chemistries  Recent Labs  Lab 07/27/22 2157  NA 137  K 2.6*  CL 98  CO2 28  GLUCOSE 115*  BUN 17  CREATININE 0.98  CALCIUM 8.5*  MG 1.5*  AST 133*  ALT 112*  ALKPHOS 66  BILITOT 1.1    ------------------------------------------------------------------------------------------------------------------  Cardiac Enzymes No results for input(s): "TROPONINI" in the last 168 hours. ------------------------------------------------------------------------------------------------------------------  RADIOLOGY:  CT Head Wo Contrast  Result Date: 07/27/2022 CLINICAL DATA:  Trauma fall laceration to posterior head EXAM: CT HEAD WITHOUT CONTRAST CT CERVICAL SPINE WITHOUT CONTRAST TECHNIQUE: Multidetector CT imaging of the head and cervical spine was performed following the standard protocol without intravenous contrast. Multiplanar CT image reconstructions of the cervical spine were also generated. RADIATION DOSE REDUCTION: This exam was performed according to the departmental dose-optimization program which includes automated exposure control, adjustment of the mA and/or kV according to patient size and/or use of iterative reconstruction technique. COMPARISON:  CT brain and cervical spine 01/19/2022 FINDINGS: CT HEAD FINDINGS Brain: No acute territorial infarction, hemorrhage or intracranial mass. Atrophy and mild chronic small vessel ischemic changes of the white matter. The ventricles are nonenlarged Vascular: No hyperdense vessels. Vertebral and carotid vascular calcification Skull: Normal. Negative for fracture or focal lesion. Sinuses/Orbits: No acute finding. Other: Small posterior scalp laceration near vertex CT CERVICAL SPINE FINDINGS Alignment: Trace anterolisthesis C4 on C5. Facet alignment is within normal limits. Skull base and vertebrae: Vertebral body heights are maintained. Fracture through the spinous process of C7, extends to the inferior margin of the right C7 inferior facet. Some periosteal new bone formation is suggested on axial series 6, image 76 suggestive of subacute fracture. No other discrete fracture is visualized Soft tissues and spinal canal: No prevertebral fluid or  swelling. No visible canal hematoma. Disc levels: Multilevel degenerative change. Moderate disc space narrowing C5-C6. mild disc space narrowing at C6-C7. Facet degenerative changes at multiple levels with foraminal narrowing. Upper chest: Negative. Other: None IMPRESSION: 1. No CT evidence for acute intracranial abnormality. Atrophy and mild chronic small vessel ischemic changes of the white matter 2. Mildly displaced fracture involving the spinous process of C7, extends to the margin of the inferior facet on the right side at C7. Question small amount of periosteal new bone formation on some of the axial images, raising possibility of subacute process. Electronically Signed   By: Donavan Foil M.D.   On: 07/27/2022 20:21   CT Cervical Spine Wo Contrast  Result Date: 07/27/2022 CLINICAL DATA:  Trauma fall laceration to posterior head EXAM: CT HEAD WITHOUT CONTRAST CT CERVICAL SPINE WITHOUT CONTRAST TECHNIQUE: Multidetector CT imaging of the head and cervical spine was performed following the standard protocol without intravenous contrast. Multiplanar CT image reconstructions of the cervical spine were also generated. RADIATION DOSE REDUCTION: This exam was performed according to the departmental dose-optimization program which includes automated exposure control, adjustment of the mA and/or kV  according to patient size and/or use of iterative reconstruction technique. COMPARISON:  CT brain and cervical spine 01/19/2022 FINDINGS: CT HEAD FINDINGS Brain: No acute territorial infarction, hemorrhage or intracranial mass. Atrophy and mild chronic small vessel ischemic changes of the white matter. The ventricles are nonenlarged Vascular: No hyperdense vessels. Vertebral and carotid vascular calcification Skull: Normal. Negative for fracture or focal lesion. Sinuses/Orbits: No acute finding. Other: Small posterior scalp laceration near vertex CT CERVICAL SPINE FINDINGS Alignment: Trace anterolisthesis C4 on C5. Facet  alignment is within normal limits. Skull base and vertebrae: Vertebral body heights are maintained. Fracture through the spinous process of C7, extends to the inferior margin of the right C7 inferior facet. Some periosteal new bone formation is suggested on axial series 6, image 76 suggestive of subacute fracture. No other discrete fracture is visualized Soft tissues and spinal canal: No prevertebral fluid or swelling. No visible canal hematoma. Disc levels: Multilevel degenerative change. Moderate disc space narrowing C5-C6. mild disc space narrowing at C6-C7. Facet degenerative changes at multiple levels with foraminal narrowing. Upper chest: Negative. Other: None IMPRESSION: 1. No CT evidence for acute intracranial abnormality. Atrophy and mild chronic small vessel ischemic changes of the white matter 2. Mildly displaced fracture involving the spinous process of C7, extends to the margin of the inferior facet on the right side at C7. Question small amount of periosteal new bone formation on some of the axial images, raising possibility of subacute process. Electronically Signed   By: Donavan Foil M.D.   On: 07/27/2022 20:21      IMPRESSION AND PLAN:  Assessment and Plan: * Recurrent falls - The patient has subsequeildly mildly displaced fracture involving the spinous process of C7 extending to the margin of the inferior facet on the right side at C7. She will be admitted to a medical telemetry observation bed. - PT consult will be obtained. - Pain management will be given if needed.  At this time she has no neck pain or pain elsewhere.  Hypokalemia - Aggressive potassium replacement will be pursued. - We will follow potassium level.  Hypomagnesemia - We will replace magnesium and follow its level.  Atrial fibrillation and flutter ([place]) - We will continue amiodarone, Toprol-XL and aspirin.  GERD without esophagitis - We will continue PPI therapy.  Essential hypertension - We will hold  off HCTZ given significant hypokalemia and hypomagnesemia. - We will continue Toprol-XL and Vasotec.   DVT prophylaxis: Lovenox.  Advanced Care Planning:  Code Status: DNR/DNI. Family Communication:  The plan of care was discussed in details with the patient (and family). I answered all questions. The patient agreed to proceed with the above mentioned plan. Further management will depend upon hospital course. Disposition Plan: Back to previous home environment Consults called: none All the records are reviewed and case discussed with ED provider.  Status is: Observation  I certify that at the time of admission, it is my clinical judgment that the patient will require  hospital care extending less than 2 midnights.                            Dispo: The patient is from: Home              Anticipated d/c is to: Home              Patient currently is not medically stable to d/c.  Difficult to place patient: No  Christel Mormon M.D on 07/27/2022 at 11:56 PM  [facility]   From 7 PM-7 AM, contact night-coverage [email or web address]  CC: Primary care physician; Dion Body, MD

## 2022-07-27 NOTE — ED Notes (Signed)
K 2.6.  dr Jori Moll notified.

## 2022-07-27 NOTE — Assessment & Plan Note (Signed)
-   We will hold off HCTZ given significant hypokalemia and hypomagnesemia. - We will continue Toprol-XL and Vasotec.

## 2022-07-27 NOTE — ED Notes (Signed)
Pt had mechanical fall at nursing facility per family.  Pt oriented at this time.  Unlabored.  Able to stand at bedside.  Pt denies pain at this time.  Family remains at bedside. Call bell within reach.

## 2022-07-27 NOTE — Assessment & Plan Note (Signed)
-   We will replace magnesium and follow its level.

## 2022-07-28 ENCOUNTER — Encounter: Payer: Self-pay | Admitting: Family Medicine

## 2022-07-28 DIAGNOSIS — S12600A Unspecified displaced fracture of seventh cervical vertebra, initial encounter for closed fracture: Secondary | ICD-10-CM | POA: Insufficient documentation

## 2022-07-28 DIAGNOSIS — R296 Repeated falls: Secondary | ICD-10-CM | POA: Diagnosis not present

## 2022-07-28 DIAGNOSIS — E876 Hypokalemia: Secondary | ICD-10-CM | POA: Diagnosis not present

## 2022-07-28 DIAGNOSIS — I10 Essential (primary) hypertension: Secondary | ICD-10-CM | POA: Diagnosis not present

## 2022-07-28 DIAGNOSIS — I4892 Unspecified atrial flutter: Secondary | ICD-10-CM | POA: Diagnosis not present

## 2022-07-28 DIAGNOSIS — R531 Weakness: Secondary | ICD-10-CM | POA: Insufficient documentation

## 2022-07-28 LAB — URINALYSIS, ROUTINE W REFLEX MICROSCOPIC
Bilirubin Urine: NEGATIVE
Glucose, UA: NEGATIVE mg/dL
Hgb urine dipstick: NEGATIVE
Ketones, ur: 5 mg/dL — AB
Leukocytes,Ua: NEGATIVE
Nitrite: NEGATIVE
Protein, ur: NEGATIVE mg/dL
Specific Gravity, Urine: 1.009 (ref 1.005–1.030)
pH: 7 (ref 5.0–8.0)

## 2022-07-28 LAB — BASIC METABOLIC PANEL
Anion gap: 8 (ref 5–15)
BUN: 14 mg/dL (ref 8–23)
CO2: 26 mmol/L (ref 22–32)
Calcium: 8 mg/dL — ABNORMAL LOW (ref 8.9–10.3)
Chloride: 104 mmol/L (ref 98–111)
Creatinine, Ser: 0.75 mg/dL (ref 0.44–1.00)
GFR, Estimated: >60 mL/min (ref >60)
Glucose, Bld: 105 mg/dL — ABNORMAL HIGH (ref 70–99)
Potassium: 3 mmol/L — ABNORMAL LOW (ref 3.5–5.1)
Sodium: 138 mmol/L (ref 135–145)

## 2022-07-28 LAB — CBC
HCT: 34.5 % — ABNORMAL LOW (ref 36.0–46.0)
Hemoglobin: 11.2 g/dL — ABNORMAL LOW (ref 12.0–15.0)
MCH: 29.9 pg (ref 26.0–34.0)
MCHC: 32.5 g/dL (ref 30.0–36.0)
MCV: 92.2 fL (ref 80.0–100.0)
Platelets: 128 10*3/uL — ABNORMAL LOW (ref 150–400)
RBC: 3.74 MIL/uL — ABNORMAL LOW (ref 3.87–5.11)
RDW: 14.3 % (ref 11.5–15.5)
WBC: 7.6 10*3/uL (ref 4.0–10.5)
nRBC: 0 % (ref 0.0–0.2)

## 2022-07-28 LAB — TROPONIN I (HIGH SENSITIVITY): Troponin I (High Sensitivity): 19 ng/L — ABNORMAL HIGH (ref <18)

## 2022-07-28 LAB — MAGNESIUM: Magnesium: 1.9 mg/dL (ref 1.7–2.4)

## 2022-07-28 MED ORDER — MAGNESIUM SULFATE 2 GM/50ML IV SOLN
2.0000 g | Freq: Once | INTRAVENOUS | Status: AC
  Administered 2022-07-28: 2 g via INTRAVENOUS
  Filled 2022-07-28: qty 50

## 2022-07-28 MED ORDER — DIAZEPAM 5 MG/ML IJ SOLN: 2.5000 mg | Freq: Once | INTRAMUSCULAR | Status: DC

## 2022-07-28 MED ORDER — POTASSIUM CHLORIDE CRYS ER 20 MEQ PO TBCR
40.0000 meq | EXTENDED_RELEASE_TABLET | Freq: Two times a day (BID) | ORAL | Status: AC
  Administered 2022-07-28 (×2): 40 meq via ORAL
  Filled 2022-07-28 (×2): qty 2

## 2022-07-28 NOTE — Evaluation (Signed)
Physical Therapy Evaluation Patient Details Name: Terry Kaiser MRN: [id number] DOB: 1936/04/01 Today's Date: 07/28/2022  History of Present Illness  Pt admitted to [facility] on 07/27/22 under observation for mechanical fall, resulting in small laceration to posterior head. Hypokalemic upon admission, now improving. Imaging significant for mildly displaced spinous process of C7, possible subacute with no neck pain. Pt cleared for participation with therapy via attending MD.   Clinical Impression  Pt is a 86 year old F admitted to hospital on 07/27/22 for recurrent falls. Pt questionable historian of health secondary to baseline dementia, and only being oriented to self and time. Pt states that she was previously Ind with ADL's, required PRN assist for cooking, and would ambulate shorter distances with RW. She is a resident at [facility].   Pt presents with impaired processing and cognition, poor safety awareness, decreased activity tolerance, c/o mild dizziness with positional changes, shoulder and hip weakness, limited R shoulder AROM and pain, and increased c/o neck pain with L rotation, resulting in impaired functional mobility. Due to deficits, pt required mod I for bed mobility, mod assist for STS transfers with RW, and CGA to ambulate 72f with RW. Increased cueing required throughout session for safety, sequencing, and attention to task. Deficits limit the pt's ability to safely and independently perform ADL's, transfer, and ambulate, and increase her risk of falls. Pt will benefit from acute skilled PT services to address deficits for return to baseline function.   At this time, pt is at increased risk of falls secondary to weakness, decreased balance, impaired cognition, and poor safety awareness. Therefore, PT recommends SNF rehab at [place] as she currently requires increased assist for functional mobility/safety, to reduce risk of falls/injury. Pt may benefit from long-term placement at memory  care unit, as cognition is limiting her safety/Ind with ADL's and functional mobility.        Recommendations for follow up therapy are one component of a multi-disciplinary discharge planning process, led by the attending physician.  Recommendations may be updated based on patient status, additional functional criteria and insurance authorization.  Follow Up Recommendations Skilled nursing-short term rehab (<3 hours/day) Can patient physically be transported by private vehicle: Yes    Assistance Recommended at Discharge Frequent or constant Supervision/Assistance  Patient can return home with the following  A little help with walking and/or transfers;A little help with bathing/dressing/bathroom;Assistance with cooking/housework;Help with stairs or ramp for entrance;Direct supervision/assist for medications management    Equipment Recommendations None recommended by PT     Functional Status Assessment Patient has had a recent decline in their functional status and demonstrates the ability to make significant improvements in function in a reasonable and predictable amount of time.     Precautions / Restrictions Precautions Precautions: Fall Restrictions Other Position/Activity Restrictions: C7 spinal process fx      Mobility  Bed Mobility Overal bed mobility: Modified Independent             General bed mobility comments: increased time/effort with HOB elevated    Transfers Overall transfer level: Needs assistance Equipment used: Rolling walker (2 wheels) Transfers: Sit to/from Stand Sit to Stand: Mod assist           General transfer comment: for power to stand from EOB with RW; max multimodal cues for safety, sequencing, and hand placement. Mod assist for controlled descent to sit EOB, demonstrating poor eccentric control.    Ambulation/Gait Ambulation/Gait assistance: Min guard Gait Distance (Feet): 16 Feet Assistive device: Rolling walker (2  wheels)          General Gait Details: CGA for safety to ambulate short distance in room. Increased multimodal cues for attention to task and sequencing. Demonstrates slow unsteady gait with narrow BOS, decreased step length/foot clearance bil, and poor safety awareness intermittently taking hands off RW.      Balance Overall balance assessment: Needs assistance Sitting-balance support: No upper extremity supported, Feet supported Sitting balance-Leahy Scale: Good     Standing balance support: During functional activity, Bilateral upper extremity supported Standing balance-Leahy Scale: Fair Standing balance comment: increased unsteadiness secondary to poor safety awareness and narrow BOS                             Pertinent Vitals/Pain Pain Assessment Pain Assessment: Faces Faces Pain Scale: Hurts little more Pain Location: neck with rotation; R shoulder flexion Pain Descriptors / Indicators: Aching, Dull Pain Intervention(s): Monitored during session (MD/RN notified)    Home Living Family/patient expects to be discharged to:: Assisted living                   Additional Comments: [facility]    Prior Function Prior Level of Function : Patient poor historian/Family not available             Mobility Comments: Pt quesitonable historian of health due to baseline cognitive deficits/dementia.  Per chart review, pt has had multiple falls with most recent fall resulting in head trauma and C7 spinal process fracture. Reports using RW for limited ambulation at facility. Per chart review and eval 69moago "pt ambulatory with SPC at baseline but often carries SPC instead of using it" ADLs Comments: Pt quesitonable historian of health due to baseline cognitive deficits/dementia. States that she does not require assist for ADL's and requires PRN assist with cooking. Per chart review and eval 611mogo "pt has been needing more assist for ADL/IADL management due to cognition. Son  visits frequently and provides assist with medication management and shopping. She gets her meals from facility 3x/day."     Hand Dominance   Dominant Hand: Right    Extremity/Trunk Assessment   Upper Extremity Assessment Upper Extremity Assessment: Generalized weakness (bil shoulder flexion 3+/5; otherwise grossly 4/5. Limited R shoulder flexion to ~100deg secondary to c/o pain. unable to assess sensation secondary to cognition)    Lower Extremity Assessment Lower Extremity Assessment: Overall WFL for tasks assessed (hip flexion 4+/5; otherwise grossly 5/5. unable to assess sensation secondary to cognition)    Cervical / Trunk Assessment Cervical / Trunk Assessment: Kyphotic  Communication   Communication: Expressive difficulties  Cognition Arousal/Alertness: Awake/alert Behavior During Therapy: WFL for tasks assessed/performed Overall Cognitive Status: History of cognitive impairments - at baseline                                 General Comments: A&O x2 (person and time); unable to state location and situation despite cues. Able to follow 2-step commands inconsistently, requiring increased time for processing of task        General Comments General comments (skin integrity, edema, etc.): increased pain behaviors with L cervical rotation, likely secondary to C7 spinous process fracture    Exercises Other Exercises Other Exercises: Participates in bed mobility, transfers, and minimal gait with RW. Increased cueing required for safety/attention to task. Increased assist for STS transfers at EOB. C/o mild dizziness with  positional changes with BP WNL; no c/o HA, blurred vision, or diplopia. Other Exercises: Educated re: PT role/POC, DC recommendations, call for help, eating meals.   Assessment/Plan    PT Assessment Patient needs continued PT services  PT Problem List Decreased strength;Decreased range of motion;Decreased activity tolerance;Decreased  balance;Decreased mobility;Decreased cognition;Decreased safety awareness;Pain       PT Treatment Interventions DME instruction;Gait training;Functional mobility training;Therapeutic activities;Therapeutic exercise;Balance training;Neuromuscular re-education;Cognitive remediation;Patient/family education    PT Goals (Current goals can be found in the Care Plan section)  Acute Rehab PT Goals Patient Stated Goal: "get arms working better" PT Goal Formulation: With patient Time For Goal Achievement: 08/11/22 Potential to Achieve Goals: Good    Frequency Min 2X/week        AM-PAC PT "6 Clicks" Mobility  Outcome Measure Help needed turning from your back to your side while in a flat bed without using bedrails?: None Help needed moving from lying on your back to sitting on the side of a flat bed without using bedrails?: None Help needed moving to and from a bed to a chair (including a wheelchair)?: A Little Help needed standing up from a chair using your arms (e.g., wheelchair or bedside chair)?: A Lot Help needed to walk in hospital room?: A Little Help needed climbing 3-5 steps with a railing? : A Lot 6 Click Score: 18    End of Session Equipment Utilized During Treatment: Gait belt Activity Tolerance: Patient tolerated treatment well;Patient limited by fatigue Patient left: in bed;with bed alarm set;with call bell/phone within reach Nurse Communication: Mobility status PT Visit Diagnosis: Unsteadiness on feet (R26.81);Repeated falls (R29.6);Muscle weakness (generalized) (M62.81);History of falling (Z91.81);Pain Pain - Right/Left:  (neck)    Time: 1003-1030 PT Time Calculation (min) (ACUTE ONLY): 27 min   Charges:   PT Evaluation $PT Eval Low Complexity: 1 Low PT Treatments $Therapeutic Activity: 8-22 mins       Herminio Commons, PT, DPT 12:05 PM,07/28/22 Physical Therapist - [facility]

## 2022-07-28 NOTE — ED Notes (Signed)
Pt assisted to restroom with walker. Disoriented. Eating breakfast. Bed alarm in place. Call bell in reach

## 2022-07-28 NOTE — ED Notes (Signed)
I received pt from other assignment and pt was with purewick in place. Pt repeatedly states that she needs to urinate but cannot urinate in the bed and would like help using a toilet. Bedside toilet was provided and with assisitance from another RN and a walker for steadiness, pt was able to use bedside toilet. Pt urinated and had a small bowel movement, therefore, urine sample was not obtained. Pt was assisted back to bed and lights dimmed, bed alarm set, and call bell with instructions given to patient. Pt is currently sleeping.

## 2022-07-28 NOTE — ED Notes (Signed)
Pharmacy waiting for updated med requisition from facility to verify medications. Provider, Sharion Settler, aware.

## 2022-07-28 NOTE — Progress Notes (Signed)
PHARMACIST - PHYSICIAN COMMUNICATION  CONCERNING: P&T Medication Policy Regarding Oral Bisphosphonates  RECOMMENDATION: Your order for alendronate (Fosamax), ibandronate (Boniva), or risedronate (Actonel) has been discontinued at this time.  If the patient's post-hospital medical condition warrants safe use of this class of drugs, please resume the pre-hospital regimen upon discharge.  DESCRIPTION:  Alendronate (Fosamax), ibandronate (Boniva), and risedronate (Actonel) can cause severe esophageal erosions in patients who are unable to remain upright at least 30 minutes after taking this medication.   Since brief interruptions in therapy are thought to have minimal impact on bone mineral density, the [place] has established that bisphosphonate orders should be routinely discontinued during hospitalization.   To override this safety policy and permit administration of Boniva, Fosamax, or Actonel in the hospital, prescribers must write "DO NOT HOLD" in the comments section when placing the order for this class of medications.  Renda Rolls, PharmD, MBA 07/28/2022 2:09 AM

## 2022-07-28 NOTE — ED Notes (Signed)
Pt assisted to restroom with walker. Bed alarm in place

## 2022-07-28 NOTE — Evaluation (Signed)
.oOccupational Therapy Evaluation Patient Details Name: Terry Kaiser MRN: [id number] DOB: 06-27-1936 Today's Date: 07/28/2022   History of Present Illness Pt admitted to [facility] on 07/27/22 under observation for mechanical fall, resulting in small laceration to posterior head. Hypokalemic upon admission, now improving. Imaging significant for mildly displaced spinous process of C7, possible subacute with no neck pain. Pt cleared for participation with therapy via attending MD.   Clinical Impression   Terry Kaiser was seen for OT evaluation this date. Prior to hospital admission, pt was receiving care at an ALF where she has meals prepared and has intermittent assist for ADL management. Information regarding PLOF limited to chart review as no family is present during evaluation to confirm. Pt presents to acute OT demonstrating impaired ADL performance and functional mobility 2/2 increased pain with mobility, decreased cognition, decreased safety awareness, and generalized weakness (See OT problem list). Pt currently requires MIN A For bed mobility, close CGA for functional transfers/toileting and SUPERVISION for seated UB ADL management.  Pt would benefit from skilled OT services to address noted impairments and functional limitations (see below for any additional details) in order to maximize safety and independence while minimizing falls risk and caregiver burden. Upon hospital discharge, recommend STR to maximize pt safety and return to PLOF.        Recommendations for follow up therapy are one component of a multi-disciplinary discharge planning process, led by the attending physician.  Recommendations may be updated based on patient status, additional functional criteria and insurance authorization.   Follow Up Recommendations  Skilled nursing-short term rehab (<3 hours/day)    Assistance Recommended at Discharge Frequent or constant Supervision/Assistance  Patient can return home with the  following A lot of help with bathing/dressing/bathroom;A little help with walking and/or transfers;Assistance with cooking/housework;Assistance with feeding;Assist for transportation;Direct supervision/assist for financial management;Direct supervision/assist for medications management    Functional Status Assessment  Patient has had a recent decline in their functional status and demonstrates the ability to make significant improvements in function in a reasonable and predictable amount of time.  Equipment Recommendations  None recommended by OT    Recommendations for Other Services       Precautions / Restrictions Precautions Precautions: Fall Restrictions Other Position/Activity Restrictions: C7 spinal process fx      Mobility Bed Mobility Overal bed mobility: Needs Assistance Bed Mobility: Sit to Supine, Supine to Sit     Supine to sit: Modified independent (Device/Increase time), HOB elevated Sit to supine: Min assist   General bed mobility comments: Min A to bring BLE over EOB and reposition shoulders during sit>sup. Pt noted with difficulty sequencing bed mobility.    Transfers Overall transfer level: Needs assistance Equipment used: Rolling walker (2 wheels) Transfers: Sit to/from Stand Sit to Stand: Min guard, Supervision                  Balance Overall balance assessment: Needs assistance Sitting-balance support: No upper extremity supported, Feet supported Sitting balance-Leahy Scale: Good     Standing balance support: During functional activity, Bilateral upper extremity supported Standing balance-Leahy Scale: Fair Standing balance comment: increased unsteadiness secondary to poor safety awareness and narrow BOS                           ADL either performed or assessed with clinical judgement   ADL Overall ADL's : Needs assistance/impaired  General ADL Comments: Pt functionally  limited by impaired cognition, decreased safety awareness, and generalized weakness. She requires close supervision for toilet transfer and toileting using a RW For functional mobility this date. MIN A for bed mobility.     Vision Baseline Vision/History: 1 Wears glasses Ability to See in Adequate Light: 1 Impaired Patient Visual Report: No change from baseline       Perception     Praxis      Pertinent Vitals/Pain Pain Assessment Pain Assessment: Faces Faces Pain Scale: Hurts little more Pain Location: neck with rotation; BLE with mobility Pain Descriptors / Indicators: Sore, Grimacing, Aching Pain Intervention(s): Limited activity within patient's tolerance, Monitored during session     Hand Dominance Right   Extremity/Trunk Assessment Upper Extremity Assessment Upper Extremity Assessment: Generalized weakness;Difficult to assess due to impaired cognition (grossly 3/5 t/o no focal weakness, decreased shoulder flexion 2/2 pain.)   Lower Extremity Assessment Lower Extremity Assessment: Overall WFL for tasks assessed;Defer to PT evaluation   Cervical / Trunk Assessment Cervical / Trunk Assessment: Kyphotic   Communication Communication Communication: Expressive difficulties   Cognition Arousal/Alertness: Awake/alert Behavior During Therapy: WFL for tasks assessed/performed Overall Cognitive Status: History of cognitive impairments - at baseline                                 General Comments: A&O x2 (person and time); unable to state location and situation despite cues. Able to follow 2-step commands inconsistently, requiring increased time for processing of task     General Comments  increased pain behaviors with L cervical rotation, likely secondary to C7 spinous process fracture    Exercises Other Exercises Other Exercises: OT facilitated bed/functional mobiltiy and toileting this date. Pt requires increased time/cueing for multi-step tasks and is  noted to have difficulty with receptive communication during session. Pt education limited 2/2 baseline cognition.   Shoulder Instructions      Home Living Family/patient expects to be discharged to:: Assisted living                             Home Equipment: Shower seat;Grab bars - tub/shower;Standard Walker;Cane - single point   Additional Comments: [facility]      Prior Functioning/Environment Prior Level of Function : Patient poor historian/Family not available             Mobility Comments: Pt quesitonable historian of health due to baseline cognitive deficits/dementia.  Per chart review, pt has had multiple falls with most recent fall resulting in head trauma and C7 spinal process fracture. Reports using RW for limited ambulation at facility. Per chart review and eval 59moago "pt ambulatory with SPC at baseline but often carries SPC instead of using it" ADLs Comments: Pt quesitonable historian of health due to baseline cognitive deficits/dementia. States that she does not require assist for ADL's and requires PRN assist with cooking. Per chart review and eval 667mogo "pt has been needing more assist for ADL/IADL management due to cognition. Son visits frequently and provides assist with medication management and shopping. She gets her meals from facility 3x/day."        OT Problem List: Decreased strength;Decreased coordination;Decreased range of motion;Decreased cognition;Decreased activity tolerance;Decreased safety awareness;Impaired balance (sitting and/or standing);Decreased knowledge of use of DME or AE      OT Treatment/Interventions: Self-care/ADL training;Therapeutic exercise;Therapeutic activities;DME and/or AE instruction;Balance training;Patient/family education;Cognitive  remediation/compensation    OT Goals(Current goals can be found in the care plan section) Acute Rehab OT Goals Patient Stated Goal: To see my son OT Goal Formulation: With  patient Time For Goal Achievement: 08/11/22 Potential to Achieve Goals: Good ADL Goals Pt Will Perform Eating: with modified independence;sitting Pt Will Perform Grooming: with modified independence;sitting Pt Will Perform Lower Body Dressing: sit to/from stand;with adaptive equipment;with set-up;with supervision Pt Will Transfer to Toilet: regular height toilet;with set-up;with supervision;ambulating Pt Will Perform Toileting - Clothing Manipulation and hygiene: with set-up;with supervision;with adaptive equipment;sit to/from stand  OT Frequency: Min 2X/week    Co-evaluation              AM-PAC OT "6 Clicks" Daily Activity     Outcome Measure Help from another person eating meals?: A Little Help from another person taking care of personal grooming?: A Little Help from another person toileting, which includes using toliet, bedpan, or urinal?: A Little Help from another person bathing (including washing, rinsing, drying)?: A Little Help from another person to put on and taking off regular upper body clothing?: A Little Help from another person to put on and taking off regular lower body clothing?: A Little 6 Click Score: 18   End of Session Equipment Utilized During Treatment: Gait belt;Rolling walker (2 wheels)  Activity Tolerance: Patient tolerated treatment well Patient left: in bed;with call bell/phone within reach;with bed alarm set  OT Visit Diagnosis: Other abnormalities of gait and mobility (R26.89);Muscle weakness (generalized) (M62.81);Other symptoms and signs involving cognitive function                Time: 1431-1445 OT Time Calculation (min): 14 min Charges:  OT General Charges $OT Visit: 1 Visit OT Evaluation $OT Eval Moderate Complexity: 1 Mod  Shara Blazing, M.S., OTR/L 07/28/22, 3:39 PM

## 2022-07-28 NOTE — Progress Notes (Addendum)
PROGRESS NOTE    Terry Kaiser  HEN:[id number] DOB: Jun 23, 1936 DOA: 07/27/2022 PCP: Dion Body, MD   Assessment & Plan:   Principal Problem:   Recurrent falls Active Problems:   Hypokalemia   Hypomagnesemia   Essential hypertension   GERD without esophagitis   Atrial fibrillation and flutter ([place])  Assessment and Plan: Recurrent falls: etiology unclear. PT recs SNF. OT consulted   C7 fracture: mildly displaced involving the spinous process of C7, possibly subacute. No neck pain. Will likely need outpatient f/u. Neuro surg consulted   Hypokalemia: potassium given  Hypomagnesemia: mag sulfate ordered   A. flutter: unspecified. Continue on home dose of amio, metoprolol   GERD: continue on PPI    HTN: continue on home dose of metoprolol, enalapril. Holding HCTZ  Thrombocytopenia: etiology unclear. Will continue to monitor   Transaminitis: etiology unclear. If AST, ALT continue to rise, will hold statin   DVT prophylaxis: lovenox  Code Status: DNR Family Communication: discussed pt's care w/ pt's family at bedside and answered their questions  Disposition Plan: likely d/c to SNF   Level of care: Telemetry Medical  Status is: Observation The patient remains OBS appropriate and will d/c before 2 midnights.    Consultants:  Neuro surg   Procedures:  Antimicrobials:   Subjective: Pt c/o malaise   Objective: Vitals:   07/28/22 1000 07/28/22 1015 07/28/22 1030 07/28/22 1104  BP: (!) 181/68 (!) 181/76    Pulse: (!) 55  (!) 54 60  Resp: '18 13 17   '$ Temp:      TempSrc:      SpO2: 96%  99%   Weight:      Height:        Intake/Output Summary (Last 24 hours) at 07/28/2022 1409 Last data filed at 07/28/2022 0314 Gross per 24 hour  Intake 1037.16 ml  Output --  Net 1037.16 ml   Filed Weights   07/27/22 1934  Weight: 63.5 kg    Examination:  General exam: Appears calm and comfortable. Very hard of hearing   Respiratory system: Clear to  auscultation. Respiratory effort normal. Cardiovascular system: irregular. No rubs, gallops or clicks. No pedal edema. Gastrointestinal system: Abdomen is nondistended, soft and nontender.  Normal bowel sounds heard. Central nervous system: Alert and awake. Moves all extremities  Psychiatry: Judgement and insight appears at baseline. Flat mood and affect    Data Reviewed: I have personally reviewed following labs and imaging studies  CBC: Recent Labs  Lab 07/27/22 2157 07/28/22 0545  WBC 9.7 7.6  HGB 12.4 11.2*  HCT 38.2 34.5*  MCV 92.7 92.2  PLT 149* 361*   Basic Metabolic Panel: Recent Labs  Lab 07/27/22 2157 07/28/22 0545  NA 137 138  K 2.6* 3.0*  CL 98 104  CO2 28 26  GLUCOSE 115* 105*  BUN 17 14  CREATININE 0.98 0.75  CALCIUM 8.5* 8.0*  MG 1.5* 1.9   GFR: Estimated Creatinine Clearance: 49 mL/min (by C-G formula based on SCr of 0.75 mg/dL). Liver Function Tests: Recent Labs  Lab 07/27/22 2157  AST 133*  ALT 112*  ALKPHOS 66  BILITOT 1.1  PROT 6.4*  ALBUMIN 3.5   No results for input(s): "LIPASE", "AMYLASE" in the last 168 hours. No results for input(s): "AMMONIA" in the last 168 hours. Coagulation Profile: No results for input(s): "INR", "PROTIME" in the last 168 hours. Cardiac Enzymes: No results for input(s): "CKTOTAL", "CKMB", "CKMBINDEX", "TROPONINI" in the last 168 hours. BNP (last 3 results) No  results for input(s): "PROBNP" in the last 8760 hours. HbA1C: No results for input(s): "HGBA1C" in the last 72 hours. CBG: No results for input(s): "GLUCAP" in the last 168 hours. Lipid Profile: No results for input(s): "CHOL", "HDL", "LDLCALC", "TRIG", "CHOLHDL", "LDLDIRECT" in the last 72 hours. Thyroid Function Tests: No results for input(s): "TSH", "T4TOTAL", "FREET4", "T3FREE", "THYROIDAB" in the last 72 hours. Anemia Panel: No results for input(s): "VITAMINB12", "FOLATE", "FERRITIN", "TIBC", "IRON", "RETICCTPCT" in the last 72 hours. Sepsis  Labs: No results for input(s): "PROCALCITON", "LATICACIDVEN" in the last 168 hours.  No results found for this or any previous visit (from the past 240 hour(s)).       Radiology Studies: CT Head Wo Contrast  Result Date: 07/27/2022 CLINICAL DATA:  Trauma fall laceration to posterior head EXAM: CT HEAD WITHOUT CONTRAST CT CERVICAL SPINE WITHOUT CONTRAST TECHNIQUE: Multidetector CT imaging of the head and cervical spine was performed following the standard protocol without intravenous contrast. Multiplanar CT image reconstructions of the cervical spine were also generated. RADIATION DOSE REDUCTION: This exam was performed according to the departmental dose-optimization program which includes automated exposure control, adjustment of the mA and/or kV according to patient size and/or use of iterative reconstruction technique. COMPARISON:  CT brain and cervical spine 01/19/2022 FINDINGS: CT HEAD FINDINGS Brain: No acute territorial infarction, hemorrhage or intracranial mass. Atrophy and mild chronic small vessel ischemic changes of the white matter. The ventricles are nonenlarged Vascular: No hyperdense vessels. Vertebral and carotid vascular calcification Skull: Normal. Negative for fracture or focal lesion. Sinuses/Orbits: No acute finding. Other: Small posterior scalp laceration near vertex CT CERVICAL SPINE FINDINGS Alignment: Trace anterolisthesis C4 on C5. Facet alignment is within normal limits. Skull base and vertebrae: Vertebral body heights are maintained. Fracture through the spinous process of C7, extends to the inferior margin of the right C7 inferior facet. Some periosteal new bone formation is suggested on axial series 6, image 76 suggestive of subacute fracture. No other discrete fracture is visualized Soft tissues and spinal canal: No prevertebral fluid or swelling. No visible canal hematoma. Disc levels: Multilevel degenerative change. Moderate disc space narrowing C5-C6. mild disc space  narrowing at C6-C7. Facet degenerative changes at multiple levels with foraminal narrowing. Upper chest: Negative. Other: None IMPRESSION: 1. No CT evidence for acute intracranial abnormality. Atrophy and mild chronic small vessel ischemic changes of the white matter 2. Mildly displaced fracture involving the spinous process of C7, extends to the margin of the inferior facet on the right side at C7. Question small amount of periosteal new bone formation on some of the axial images, raising possibility of subacute process. Electronically Signed   By: Donavan Foil M.D.   On: 07/27/2022 20:21   CT Cervical Spine Wo Contrast  Result Date: 07/27/2022 CLINICAL DATA:  Trauma fall laceration to posterior head EXAM: CT HEAD WITHOUT CONTRAST CT CERVICAL SPINE WITHOUT CONTRAST TECHNIQUE: Multidetector CT imaging of the head and cervical spine was performed following the standard protocol without intravenous contrast. Multiplanar CT image reconstructions of the cervical spine were also generated. RADIATION DOSE REDUCTION: This exam was performed according to the departmental dose-optimization program which includes automated exposure control, adjustment of the mA and/or kV according to patient size and/or use of iterative reconstruction technique. COMPARISON:  CT brain and cervical spine 01/19/2022 FINDINGS: CT HEAD FINDINGS Brain: No acute territorial infarction, hemorrhage or intracranial mass. Atrophy and mild chronic small vessel ischemic changes of the white matter. The ventricles are nonenlarged Vascular: No hyperdense vessels. Vertebral  and carotid vascular calcification Skull: Normal. Negative for fracture or focal lesion. Sinuses/Orbits: No acute finding. Other: Small posterior scalp laceration near vertex CT CERVICAL SPINE FINDINGS Alignment: Trace anterolisthesis C4 on C5. Facet alignment is within normal limits. Skull base and vertebrae: Vertebral body heights are maintained. Fracture through the spinous  process of C7, extends to the inferior margin of the right C7 inferior facet. Some periosteal new bone formation is suggested on axial series 6, image 76 suggestive of subacute fracture. No other discrete fracture is visualized Soft tissues and spinal canal: No prevertebral fluid or swelling. No visible canal hematoma. Disc levels: Multilevel degenerative change. Moderate disc space narrowing C5-C6. mild disc space narrowing at C6-C7. Facet degenerative changes at multiple levels with foraminal narrowing. Upper chest: Negative. Other: None IMPRESSION: 1. No CT evidence for acute intracranial abnormality. Atrophy and mild chronic small vessel ischemic changes of the white matter 2. Mildly displaced fracture involving the spinous process of C7, extends to the margin of the inferior facet on the right side at C7. Question small amount of periosteal new bone formation on some of the axial images, raising possibility of subacute process. Electronically Signed   By: Donavan Foil M.D.   On: 07/27/2022 20:21        Scheduled Meds:  amiodarone  200 mg Oral BID   ascorbic acid  500 mg Oral BID WC   calcium-vitamin D  1 tablet Oral BID WC   cyanocobalamin  500 mcg Oral Daily   diazepam  2.5 mg Intravenous Once   enalapril  20 mg Oral Daily   enoxaparin (LOVENOX) injection  40 mg Subcutaneous Q24H   escitalopram  5 mg Oral Daily   metoprolol succinate  25 mg Oral BID WC   pantoprazole  20 mg Oral Q supper   potassium chloride  40 mEq Oral BID   pravastatin  40 mg Oral q1800   Continuous Infusions:     LOS: 0 days    Time spent: 35 mins    Wyvonnia Dusky, MD [facility] Pager [phone]  If 7PM-7AM, please contact night-coverage [email or web address] 07/28/2022, 2:09 PM

## 2022-07-28 NOTE — Progress Notes (Addendum)
2000 patient very hard of hearing with hearing aides in, patient alert able to state name, month and date of birthday not year. Patient follows commands unable to answer any other questions.

## 2022-07-28 NOTE — ED Notes (Signed)
Pt refusing to stay bed and insists she can not use the purewick. Pt told multiple times that she can not get up due to the fall from today which is what brought her in today. There is no one to sit with pt or help with use of bedside commode

## 2022-07-29 DIAGNOSIS — E876 Hypokalemia: Secondary | ICD-10-CM | POA: Diagnosis not present

## 2022-07-29 DIAGNOSIS — S12600A Unspecified displaced fracture of seventh cervical vertebra, initial encounter for closed fracture: Secondary | ICD-10-CM | POA: Diagnosis not present

## 2022-07-29 DIAGNOSIS — R296 Repeated falls: Secondary | ICD-10-CM | POA: Diagnosis not present

## 2022-07-29 DIAGNOSIS — I4892 Unspecified atrial flutter: Secondary | ICD-10-CM | POA: Diagnosis not present

## 2022-07-29 DIAGNOSIS — I10 Essential (primary) hypertension: Secondary | ICD-10-CM

## 2022-07-29 LAB — COMPREHENSIVE METABOLIC PANEL
ALT: 118 U/L — ABNORMAL HIGH (ref 0–44)
AST: 131 U/L — ABNORMAL HIGH (ref 15–41)
Albumin: 3 g/dL — ABNORMAL LOW (ref 3.5–5.0)
Alkaline Phosphatase: 56 U/L (ref 38–126)
Anion gap: 5 (ref 5–15)
BUN: 12 mg/dL (ref 8–23)
CO2: 27 mmol/L (ref 22–32)
Calcium: 8.1 mg/dL — ABNORMAL LOW (ref 8.9–10.3)
Chloride: 105 mmol/L (ref 98–111)
Creatinine, Ser: 0.76 mg/dL (ref 0.44–1.00)
GFR, Estimated: >60 mL/min (ref >60)
Glucose, Bld: 93 mg/dL (ref 70–99)
Potassium: 3.7 mmol/L (ref 3.5–5.1)
Sodium: 137 mmol/L (ref 135–145)
Total Bilirubin: 1.1 mg/dL (ref 0.3–1.2)
Total Protein: 5.5 g/dL — ABNORMAL LOW (ref 6.5–8.1)

## 2022-07-29 LAB — MAGNESIUM: Magnesium: 1.9 mg/dL (ref 1.7–2.4)

## 2022-07-29 LAB — CBC
HCT: 33.7 % — ABNORMAL LOW (ref 36.0–46.0)
Hemoglobin: 10.9 g/dL — ABNORMAL LOW (ref 12.0–15.0)
MCH: 29.9 pg (ref 26.0–34.0)
MCHC: 32.3 g/dL (ref 30.0–36.0)
MCV: 92.3 fL (ref 80.0–100.0)
Platelets: 117 10*3/uL — ABNORMAL LOW (ref 150–400)
RBC: 3.65 MIL/uL — ABNORMAL LOW (ref 3.87–5.11)
RDW: 14.4 % (ref 11.5–15.5)
WBC: 7.1 10*3/uL (ref 4.0–10.5)
nRBC: 0 % (ref 0.0–0.2)

## 2022-07-29 LAB — MRSA NEXT GEN BY PCR, NASAL: MRSA by PCR Next Gen: NOT DETECTED

## 2022-07-29 NOTE — TOC Progression Note (Signed)
Transition of Care [facility]) - Progression Note    Patient Details  Name: Terry Kaiser MRN: [id number] Date of Birth: May 21, 1936  Transition of Care [facility]) CM/SW Contact  Izola Price, RN Phone Number: 07/29/2022, 3:26 PM  Clinical Narrative:  10'2: Patient has had some confusion today.  Spoke with son, Terry Kaiser. Patient has been at [facility] in April of 2023 and that would be choice to return to for STR. Patient discharged from there to [place] but was transferred to [place] with some PT services. Htx of dementia. Permission to begin process of submitting to [facility] for [facility] and other facilities in the area in [place] declines for some reason. TOC to follow up with son. Simmie Davies RN CM          Expected Discharge Plan and Services                                                 Social Determinants of Health (SDOH) Interventions    Readmission Risk Interventions    01/20/2022   12:26 PM  Readmission Risk Prevention Plan  Transportation Screening Complete  PCP or Specialist Appt within 5-7 Days Complete  Home Care Screening Complete  Medication Review (RN CM) Complete

## 2022-07-29 NOTE — Consult Note (Signed)
Referring Physician:  No referring provider defined for this encounter.  Primary Physician:  Dion Body, MD  History of Present Illness: 07/29/2022 Ms. Terry Kaiser is here with a chief complaint of falls.  She has had multiple falls over the past few months.  She is admitted for medical concerns, but workup identified a cervical fracture.  Patient denies neck pain or any other neurologic concerns.  She is easily confused.  Review of Systems:  A 10 point review of systems is negative, except for the pertinent positives and negatives detailed in the HPI.  Past Medical History: Past Medical History:  Diagnosis Date   Arthritis    joints   Borderline diabetes mellitus 06/22/15   A1c 6.1%; diet controlled   Cancer ([place])    skin   Colon polyps    Diabetes mellitus without complication (HCC)    diet controlled, type 2   Diverticulosis    GERD (gastroesophageal reflux disease)    without esophagitis   HOH (hard of hearing)    aids   Hypertension    controlled on meds   Osteoporosis    Pure hypercholesterolemia    Seasonal allergies     Past Surgical History: Past Surgical History:  Procedure Laterality Date   BREAST CYST ASPIRATION Left 1992   benign   BREAST EXCISIONAL BIOPSY Right 2000   benign   CARDIOVERSION N/A 03/22/2021   Procedure: CARDIOVERSION;  Surgeon: Corey Skains, MD;  Location: [facility] ORS;  Service: Cardiovascular;  Laterality: N/A;   CATARACT EXTRACTION W/PHACO Left 02/21/2015   Procedure: CATARACT EXTRACTION PHACO AND INTRAOCULAR LENS PLACEMENT ([place]);  Surgeon: Estill Cotta, MD;  Location: [facility] ORS;  Service: Ophthalmology;  Laterality: Left;   00:59 AP% 25.2 CDE 24.66   CATARACT EXTRACTION W/PHACO Right 02/26/2018   Procedure: CATARACT EXTRACTION PHACO AND INTRAOCULAR LENS PLACEMENT ([place])  DIABETIC RIGHT;  Surgeon: Leandrew Koyanagi, MD;  Location: [place];  Service: Ophthalmology;  Laterality: Right;  Diabetes-diet  controlled   COLONOSCOPY WITH PROPOFOL N/A 12/22/2015   Procedure: COLONOSCOPY WITH PROPOFOL;  Surgeon: Manya Silvas, MD;  [facility];  Service: Endoscopy;  Laterality: N/A;   DILATION AND CURETTAGE OF UTERUS      Allergies: Allergies as of 07/27/2022   (No Known Allergies)    Medications: No outpatient medications have been marked as taking for the 07/27/22 encounter [facility] Encounter).    Social History: Social History   Tobacco Use   Smoking status: Former   Smokeless tobacco: Never  Scientific laboratory technician Use: Never used  Substance Use Topics   Alcohol use: No   Drug use: No    Family Medical History: Family History  Problem Relation Age of Onset   Breast cancer Maternal Aunt 60    Physical Examination: Vitals:   07/28/22 2135 07/29/22 0815  BP: (!) 179/75 (!) 151/73  Pulse: 67 (!) 50  Resp: 15 17  Temp: 98.5 F (36.9 C) 98.3 F (36.8 C)  SpO2: 96% 98%    General: Patient is well developed, well nourished, calm, collected, and in no apparent distress. Attention to examination is appropriate.  Neck:   Supple.  Full range of motion.  Respiratory: Patient is breathing without any difficulty.   NEUROLOGICAL:     Awake, alert, oriented to person. She is easily flustered.  Speech is clear and fluent.  Cranial Nerves: Pupils equal round and reactive to light.  Facial tone is symmetric.  Facial sensation is symmetric. Shoulder  shrug is symmetric. Tongue protrusion is midline.  There is no pronator drift.   Strength: Side Biceps Triceps Deltoid Interossei Grip Wrist Ext. Wrist Flex.  R '5 5 5 5 5 5 5  '$ L '5 5 5 5 5 5 5   '$ Side Iliopsoas Quads Hamstring PF DF EHL  R '5 5 5 5 5 5  '$ L '5 5 5 5 5 5   '$ Reflexes are 1+ and symmetric at the biceps, triceps, brachioradialis, patella and achilles.   Hoffman's is absent.   Bilateral upper and lower extremity sensation is intact to light touch.    No evidence of dysmetria noted.  Gait is untested.      Medical Decision Making  Imaging: CT C spine 07/27/22 IMPRESSION: 1. No CT evidence for acute intracranial abnormality. Atrophy and mild chronic small vessel ischemic changes of the white matter 2. Mildly displaced fracture involving the spinous process of C7, extends to the margin of the inferior facet on the right side at C7. Question small amount of periosteal new bone formation on some of the axial images, raising possibility of subacute process.     Electronically Signed   By: Donavan Foil M.D.   On: 07/27/2022 20:21  I have personally reviewed the images and agree with the above interpretation.  Assessment and Plan: Terry Kaiser is a pleasant 86 y.o. female with stable C7 fracture that occurred some time in the last few weeks based on imaging.    - Collar for comfort.  - Will follow up as outpatient - No surgical fixation needed    [place] Searles K. Izora Ribas MD, [facility] Neurosurgery

## 2022-07-29 NOTE — NC FL2 (Deleted)
[place] LEVEL OF CARE SCREENING TOOL     IDENTIFICATION  Patient Name: Terry Kaiser Birthdate: 04-24-1936 Sex: female Admission Date (Current Location): 07/27/2022  [facility] and [place] Number:  Engineering geologist and Address:  [facility], [address], [place], [place] [address]      Provider Number: [id number]  Attending Physician Name and Address:  Wyvonnia Dusky, MD  Relative Name and Phone Number:  Raseel, Jans [phone])   [phone] [facility] Phone)    Current Level of Care: Hospital Recommended Level of Care: [place] Prior Approval Number: [id number]  Date Approved/Denied:   PASRR Number:    Discharge Plan: SNF    Current Diagnoses: Patient Active Problem List   Diagnosis Date Noted   Closed displaced fracture of seventh cervical vertebra ([place])    Weakness    Recurrent falls 07/27/2022   Hypomagnesemia 07/27/2022   Atrial fibrillation and flutter ([place]) 07/27/2022   GERD without esophagitis 01/19/2022   Diverticulosis 01/19/2022   Type 2 diabetes mellitus with hyperlipidemia ([place]) 01/19/2022   Fall 01/19/2022   Known medical problems 01/19/2022   Dementia without behavioral disturbance ([place]) 01/19/2022   Hypokalemia 01/19/2022   Ecchymosis 01/19/2022   GAD (generalized anxiety disorder) 01/15/2022   Persistent atrial fibrillation ([place]) 09/25/2021   Bilateral leg edema 05/22/2021   Mild aortic regurgitation 01/31/2021   Nonrheumatic mitral valve regurgitation 01/31/2021   Emphysema of lung ([place]) 12/05/2020   DNR (do not resuscitate) 07/01/2018   History of normocytic normochromic anemia 07/01/2018   Essential hypertension 07/01/2014    Orientation RESPIRATION BLADDER Height & Weight     Self  Normal Continent Weight: 63.5 kg Height:  '5\' 6"'$  (167.6 cm)  BEHAVIORAL SYMPTOMS/MOOD NEUROLOGICAL BOWEL NUTRITION STATUS      Continent Diet  AMBULATORY STATUS COMMUNICATION OF NEEDS Skin    Limited Assist Verbally Normal                       Personal Care Assistance Level of Assistance  Bathing, Dressing Bathing Assistance: Maximum assistance   Dressing Assistance: Limited assistance     Functional Limitations Info  Hearing   Hearing Info: Impaired (hearing aids)      [place]  PT (By licensed PT), OT (By licensed OT)     PT Frequency: 5x/week OT Frequency: 5x/week            Contractures Contractures Info: Not present    Additional Factors Info  Code Status, Allergies Code Status Info: DNR Allergies Info: No Known Allergies           Current Medications (07/29/2022):  This is the current hospital active medication list Current Facility-Administered Medications  Medication Dose Route Frequency Provider Last Rate Last Admin   acetaminophen (TYLENOL) tablet 650 mg  650 mg Oral Q6H PRN Mansy, Jan A, MD       Or   acetaminophen (TYLENOL) suppository 650 mg  650 mg Rectal Q6H PRN Mansy, Jan A, MD       amiodarone (PACERONE) tablet 200 mg  200 mg Oral BID Mansy, Jan A, MD   200 mg at 07/29/22 7026   ascorbic acid (VITAMIN C) tablet 500 mg  500 mg Oral BID WC Mansy, Jan A, MD   500 mg at 07/29/22 0850   calcium-vitamin D (OSCAL WITH D) 500-5 MG-MCG per tablet 1 tablet  1 tablet Oral BID WC Mansy, Arvella Merles, MD   1 tablet  at 07/29/22 0851   cyanocobalamin (VITAMIN B12) tablet 500 mcg  500 mcg Oral Daily Mansy, Jan A, MD   500 mcg at 07/29/22 0854   diazepam (VALIUM) injection 2.5 mg  2.5 mg Intravenous Once Sharion Settler, NP       enalapril (VASOTEC) tablet 20 mg  20 mg Oral Daily Mansy, Jan A, MD   20 mg at 07/29/22 0854   enoxaparin (LOVENOX) injection 40 mg  40 mg Subcutaneous Q24H Mansy, Jan A, MD   40 mg at 07/29/22 0854   escitalopram (LEXAPRO) tablet 5 mg  5 mg Oral Daily Mansy, Jan A, MD   5 mg at 07/29/22 0851   magnesium hydroxide (MILK OF MAGNESIA) suspension 30 mL  30 mL Oral Daily PRN Mansy, Jan A, MD        metoprolol succinate (TOPROL-XL) 24 hr tablet 25 mg  25 mg Oral BID WC Mansy, Jan A, MD   25 mg at 07/29/22 0851   pantoprazole (PROTONIX) EC tablet 20 mg  20 mg Oral Q supper Mansy, Jan A, MD       traZODone (DESYREL) tablet 25 mg  25 mg Oral QHS PRN Mansy, Jan A, MD   25 mg at 07/28/22 2135     Discharge Medications: Please see discharge summary for a list of discharge medications.  Relevant Imaging Results:  Relevant Lab Results:   Additional Information SS #: [id number]  Izola Price, RN

## 2022-07-29 NOTE — NC FL2 (Addendum)
[place] LEVEL OF CARE SCREENING TOOL     IDENTIFICATION  Patient Name: Terry Kaiser Birthdate: [birth year] Sex: female Admission Date (Current Location): 07/27/2022  [facility] and [place] Number:  Engineering geologist and Address:  [facility], [address] [place], [place] [address]      Provider Number: [id number]  Attending Physician Name and Address:  Wyvonnia Dusky, MD  Relative Name and Phone Number:  Oveta, Idris [phone])   [phone] [facility] Phone)    Current Level of Care: Hospital Recommended Level of Care: [place] Prior Approval Number: [id number]  Date Approved/Denied:   PASRR Number:  [id number]  Discharge Plan: SNF    Current Diagnoses: Patient Active Problem List   Diagnosis Date Noted   Closed displaced fracture of seventh cervical vertebra ([place])    Weakness    Recurrent falls 07/27/2022   Hypomagnesemia 07/27/2022   Atrial fibrillation and flutter ([place]) 07/27/2022   GERD without esophagitis 01/19/2022   Diverticulosis 01/19/2022   Type 2 diabetes mellitus with hyperlipidemia ([place]) 01/19/2022   Fall 01/19/2022   Known medical problems 01/19/2022   Dementia without behavioral disturbance ([place]) 01/19/2022   Hypokalemia 01/19/2022   Ecchymosis 01/19/2022   GAD (generalized anxiety disorder) 01/15/2022   Persistent atrial fibrillation ([place]) 09/25/2021   Bilateral leg edema 05/22/2021   Mild aortic regurgitation 01/31/2021   Nonrheumatic mitral valve regurgitation 01/31/2021   Emphysema of lung ([place]) 12/05/2020   DNR (do not resuscitate) 07/01/2018   History of normocytic normochromic anemia 07/01/2018   Essential hypertension 07/01/2014    Orientation RESPIRATION BLADDER Height & Weight     Self  Normal Continent Weight: 63.5 kg Height:  '5\' 6"'$  (167.6 cm)  BEHAVIORAL SYMPTOMS/MOOD NEUROLOGICAL BOWEL NUTRITION STATUS      Continent Diet  AMBULATORY STATUS COMMUNICATION OF NEEDS  Skin   Limited Assist Verbally Normal                       Personal Care Assistance Level of Assistance  Bathing, Dressing Bathing Assistance: Maximum assistance   Dressing Assistance: Limited assistance     Functional Limitations Info  Hearing   Hearing Info: Impaired (hearing aids)      [place]  PT (By licensed PT), OT (By licensed OT)     PT Frequency: 5x/week OT Frequency: 5x/week            Contractures Contractures Info: Not present    Additional Factors Info  Code Status, Allergies Code Status Info: DNR Allergies Info: No Known Allergies           Current Medications (07/29/2022):  This is the current hospital active medication list Current Facility-Administered Medications  Medication Dose Route Frequency Provider Last Rate Last Admin   acetaminophen (TYLENOL) tablet 650 mg  650 mg Oral Q6H PRN Mansy, Jan A, MD       Or   acetaminophen (TYLENOL) suppository 650 mg  650 mg Rectal Q6H PRN Mansy, Jan A, MD       amiodarone (PACERONE) tablet 200 mg  200 mg Oral BID Mansy, Jan A, MD   200 mg at 07/29/22 0109   ascorbic acid (VITAMIN C) tablet 500 mg  500 mg Oral BID WC Mansy, Jan A, MD   500 mg at 07/29/22 0850   calcium-vitamin D (OSCAL WITH D) 500-5 MG-MCG per tablet 1 tablet  1 tablet Oral BID WC Mansy, Arvella Merles, MD   1 tablet  at 07/29/22 0851   cyanocobalamin (VITAMIN B12) tablet 500 mcg  500 mcg Oral Daily Mansy, Jan A, MD   500 mcg at 07/29/22 0854   diazepam (VALIUM) injection 2.5 mg  2.5 mg Intravenous Once Sharion Settler, NP       enalapril (VASOTEC) tablet 20 mg  20 mg Oral Daily Mansy, Jan A, MD   20 mg at 07/29/22 0854   enoxaparin (LOVENOX) injection 40 mg  40 mg Subcutaneous Q24H Mansy, Jan A, MD   40 mg at 07/29/22 0854   escitalopram (LEXAPRO) tablet 5 mg  5 mg Oral Daily Mansy, Jan A, MD   5 mg at 07/29/22 0851   magnesium hydroxide (MILK OF MAGNESIA) suspension 30 mL  30 mL Oral Daily PRN Mansy, Jan A, MD        metoprolol succinate (TOPROL-XL) 24 hr tablet 25 mg  25 mg Oral BID WC Mansy, Jan A, MD   25 mg at 07/29/22 0851   pantoprazole (PROTONIX) EC tablet 20 mg  20 mg Oral Q supper Mansy, Jan A, MD       traZODone (DESYREL) tablet 25 mg  25 mg Oral QHS PRN Mansy, Jan A, MD   25 mg at 07/28/22 2135     Discharge Medications: Please see discharge summary for a list of discharge medications.  Relevant Imaging Results:  Relevant Lab Results:   Additional Information SS #: [id number]  Izola Price, RN

## 2022-07-29 NOTE — Plan of Care (Signed)
  Problem: Education: Goal: Knowledge of General Education information will improve Description: Including pain rating scale, medication(s)/side effects and non-pharmacologic comfort measures Outcome: Not Progressing   Problem: Health Behavior/Discharge Planning: Goal: Ability to manage health-related needs will improve Outcome: Not Progressing   Problem: Clinical Measurements: Goal: Ability to maintain clinical measurements within normal limits will improve Outcome: Not Progressing Goal: Will remain free from infection Outcome: Not Progressing Goal: Diagnostic test results will improve Outcome: Not Progressing Goal: Respiratory complications will improve Outcome: Not Progressing Goal: Cardiovascular complication will be avoided Outcome: Not Progressing   Problem: Activity: Goal: Risk for activity intolerance will decrease Outcome: Not Progressing   Problem: Nutrition: Goal: Adequate nutrition will be maintained Outcome: Not Progressing   Problem: Coping: Goal: Level of anxiety will decrease Outcome: Not Progressing   Problem: Elimination: Goal: Will not experience complications related to bowel motility Outcome: Progressing Goal: Will not experience complications related to urinary retention Outcome: Progressing   Problem: Pain Managment: Goal: General experience of comfort will improve Outcome: Progressing   Problem: Safety: Goal: Ability to remain free from injury will improve Outcome: Not Progressing   Problem: Skin Integrity: Goal: Risk for impaired skin integrity will decrease Outcome: Not Progressing   Problem: Education: Goal: Knowledge of General Education information will improve Description: Including pain rating scale, medication(s)/side effects and non-pharmacologic comfort measures Outcome: Not Progressing   Problem: Clinical Measurements: Goal: Ability to maintain clinical measurements within normal limits will improve Outcome: Not  Progressing   Problem: Safety: Goal: Ability to remain free from injury will improve Outcome: Not Progressing   Problem: Pain Managment: Goal: General experience of comfort will improve Outcome: Progressing  Patient total care dementia unable to make all needs known and complete ADL with out guidance high fall risk

## 2022-07-29 NOTE — Progress Notes (Signed)
PROGRESS NOTE    JAYEL SCADUTO  HDQ:[id number] DOB: 1935-12-25 DOA: 07/27/2022 PCP: Dion Body, MD   Assessment & Plan:   Principal Problem:   Recurrent falls Active Problems:   Hypokalemia   Hypomagnesemia   Essential hypertension   GERD without esophagitis   Atrial fibrillation and flutter ([place])  Assessment and Plan: Recurrent falls: etiology unclear. PT/OT recs SNF.   C7 fracture: mildly displaced involving the spinous process of C7, possibly subacute. No neck pain. No surgery needed. Collar for comfort as per neuro surg. Will need outpatient w/ neuro surg, Dr. Izora Ribas   Hypokalemia: WNL today  Hypomagnesemia: WNL today    A. flutter: unspecified. Continue on home dose of metoprolol, amio   GERD: continue on PPI   HTN: continue on home dose of enalapril, metoprolol. Holding HCTZ   Thrombocytopenia: etiology unclear, labile   Transaminitis: etiology unclear, labile. Holding statin    DVT prophylaxis: lovenox  Code Status: DNR Family Communication:  Disposition Plan: likely d/c to SNF   Level of care: Telemetry Medical  Status is: Observation The patient remains OBS appropriate and will d/c before 2 midnights.    Consultants:  Neuro surg   Procedures:  Antimicrobials:   Subjective: Pt c/o malaise   Objective: Vitals:   07/28/22 1427 07/28/22 1600 07/28/22 1747 07/28/22 2135  BP:  (!) 183/80 (!) 185/77 (!) 179/75  Pulse:  65 (!) 59 67  Resp:  '17 16 15  '$ Temp: 97.9 F (36.6 C)  98.4 F (36.9 C) 98.5 F (36.9 C)  TempSrc:    Oral  SpO2:  100% 99% 96%  Weight:      Height:        Intake/Output Summary (Last 24 hours) at 07/29/2022 0727 Last data filed at 07/28/2022 2143 Gross per 24 hour  Intake 150 ml  Output --  Net 150 ml   Filed Weights   07/27/22 1934  Weight: 63.5 kg    Examination:  General exam: Appears comfortable. Hard of hearing  Respiratory system: clear breath sounds b/l  Cardiovascular system:  irregular. No rubs or clicks  Gastrointestinal system: abd is soft, NT, ND & hypoactive bowel sounds Central nervous system: alert and awake. Moves all extremities  Psychiatry:  judgement and insight appears at baseline. Flat mood and affect    Data Reviewed: I have personally reviewed following labs and imaging studies  CBC: Recent Labs  Lab 07/27/22 2157 07/28/22 0545 07/29/22 0518  WBC 9.7 7.6 7.1  HGB 12.4 11.2* 10.9*  HCT 38.2 34.5* 33.7*  MCV 92.7 92.2 92.3  PLT 149* 128* 119*   Basic Metabolic Panel: Recent Labs  Lab 07/27/22 2157 07/28/22 0545 07/29/22 0518  NA 137 138 137  K 2.6* 3.0* 3.7  CL 98 104 105  CO2 '28 26 27  '$ GLUCOSE 115* 105* 93  BUN '17 14 12  '$ CREATININE 0.98 0.75 0.76  CALCIUM 8.5* 8.0* 8.1*  MG 1.5* 1.9 1.9   GFR: Estimated Creatinine Clearance: 49 mL/min (by C-G formula based on SCr of 0.76 mg/dL). Liver Function Tests: Recent Labs  Lab 07/27/22 2157 07/29/22 0518  AST 133* 131*  ALT 112* 118*  ALKPHOS 66 56  BILITOT 1.1 1.1  PROT 6.4* 5.5*  ALBUMIN 3.5 3.0*   No results for input(s): "LIPASE", "AMYLASE" in the last 168 hours. No results for input(s): "AMMONIA" in the last 168 hours. Coagulation Profile: No results for input(s): "INR", "PROTIME" in the last 168 hours. Cardiac Enzymes: No results for input(s): "  CKTOTAL", "CKMB", "CKMBINDEX", "TROPONINI" in the last 168 hours. BNP (last 3 results) No results for input(s): "PROBNP" in the last 8760 hours. HbA1C: No results for input(s): "HGBA1C" in the last 72 hours. CBG: No results for input(s): "GLUCAP" in the last 168 hours. Lipid Profile: No results for input(s): "CHOL", "HDL", "LDLCALC", "TRIG", "CHOLHDL", "LDLDIRECT" in the last 72 hours. Thyroid Function Tests: No results for input(s): "TSH", "T4TOTAL", "FREET4", "T3FREE", "THYROIDAB" in the last 72 hours. Anemia Panel: No results for input(s): "VITAMINB12", "FOLATE", "FERRITIN", "TIBC", "IRON", "RETICCTPCT" in the last 72  hours. Sepsis Labs: No results for input(s): "PROCALCITON", "LATICACIDVEN" in the last 168 hours.  Recent Results (from the past 240 hour(s))  MRSA Next Gen by PCR, Nasal     Status: None   Collection Time: 07/29/22  5:05 AM   Specimen: Nasal Mucosa; Nasal Swab  Result Value Ref Range Status   MRSA by PCR Next Gen NOT DETECTED NOT DETECTED Final    Comment: (NOTE) The GeneXpert MRSA Assay (FDA approved for NASAL specimens only), is one component of a comprehensive MRSA colonization surveillance program. It is not intended to diagnose MRSA infection nor to guide or monitor treatment for MRSA infections. Test performance is not FDA approved in patients less than 7 years old. Performed at [facility], [address] [place], [place] [address]          Radiology Studies: CT Head Wo Contrast  Result Date: 07/27/2022 CLINICAL DATA:  Trauma fall laceration to posterior head EXAM: CT HEAD WITHOUT CONTRAST CT CERVICAL SPINE WITHOUT CONTRAST TECHNIQUE: Multidetector CT imaging of the head and cervical spine was performed following the standard protocol without intravenous contrast. Multiplanar CT image reconstructions of the cervical spine were also generated. RADIATION DOSE REDUCTION: This exam was performed according to the departmental dose-optimization program which includes automated exposure control, adjustment of the mA and/or kV according to patient size and/or use of iterative reconstruction technique. COMPARISON:  CT brain and cervical spine 01/19/2022 FINDINGS: CT HEAD FINDINGS Brain: No acute territorial infarction, hemorrhage or intracranial mass. Atrophy and mild chronic small vessel ischemic changes of the white matter. The ventricles are nonenlarged Vascular: No hyperdense vessels. Vertebral and carotid vascular calcification Skull: Normal. Negative for fracture or focal lesion. Sinuses/Orbits: No acute finding. Other: Small posterior scalp laceration near vertex CT  CERVICAL SPINE FINDINGS Alignment: Trace anterolisthesis C4 on C5. Facet alignment is within normal limits. Skull base and vertebrae: Vertebral body heights are maintained. Fracture through the spinous process of C7, extends to the inferior margin of the right C7 inferior facet. Some periosteal new bone formation is suggested on axial series 6, image 76 suggestive of subacute fracture. No other discrete fracture is visualized Soft tissues and spinal canal: No prevertebral fluid or swelling. No visible canal hematoma. Disc levels: Multilevel degenerative change. Moderate disc space narrowing C5-C6. mild disc space narrowing at C6-C7. Facet degenerative changes at multiple levels with foraminal narrowing. Upper chest: Negative. Other: None IMPRESSION: 1. No CT evidence for acute intracranial abnormality. Atrophy and mild chronic small vessel ischemic changes of the white matter 2. Mildly displaced fracture involving the spinous process of C7, extends to the margin of the inferior facet on the right side at C7. Question small amount of periosteal new bone formation on some of the axial images, raising possibility of subacute process. Electronically Signed   By: Donavan Foil M.D.   On: 07/27/2022 20:21   CT Cervical Spine Wo Contrast  Result Date: 07/27/2022 CLINICAL DATA:  Trauma fall laceration to posterior head EXAM: CT HEAD WITHOUT CONTRAST CT CERVICAL SPINE WITHOUT CONTRAST TECHNIQUE: Multidetector CT imaging of the head and cervical spine was performed following the standard protocol without intravenous contrast. Multiplanar CT image reconstructions of the cervical spine were also generated. RADIATION DOSE REDUCTION: This exam was performed according to the departmental dose-optimization program which includes automated exposure control, adjustment of the mA and/or kV according to patient size and/or use of iterative reconstruction technique. COMPARISON:  CT brain and cervical spine 01/19/2022 FINDINGS: CT  HEAD FINDINGS Brain: No acute territorial infarction, hemorrhage or intracranial mass. Atrophy and mild chronic small vessel ischemic changes of the white matter. The ventricles are nonenlarged Vascular: No hyperdense vessels. Vertebral and carotid vascular calcification Skull: Normal. Negative for fracture or focal lesion. Sinuses/Orbits: No acute finding. Other: Small posterior scalp laceration near vertex CT CERVICAL SPINE FINDINGS Alignment: Trace anterolisthesis C4 on C5. Facet alignment is within normal limits. Skull base and vertebrae: Vertebral body heights are maintained. Fracture through the spinous process of C7, extends to the inferior margin of the right C7 inferior facet. Some periosteal new bone formation is suggested on axial series 6, image 76 suggestive of subacute fracture. No other discrete fracture is visualized Soft tissues and spinal canal: No prevertebral fluid or swelling. No visible canal hematoma. Disc levels: Multilevel degenerative change. Moderate disc space narrowing C5-C6. mild disc space narrowing at C6-C7. Facet degenerative changes at multiple levels with foraminal narrowing. Upper chest: Negative. Other: None IMPRESSION: 1. No CT evidence for acute intracranial abnormality. Atrophy and mild chronic small vessel ischemic changes of the white matter 2. Mildly displaced fracture involving the spinous process of C7, extends to the margin of the inferior facet on the right side at C7. Question small amount of periosteal new bone formation on some of the axial images, raising possibility of subacute process. Electronically Signed   By: Donavan Foil M.D.   On: 07/27/2022 20:21        Scheduled Meds:  amiodarone  200 mg Oral BID   ascorbic acid  500 mg Oral BID WC   calcium-vitamin D  1 tablet Oral BID WC   cyanocobalamin  500 mcg Oral Daily   diazepam  2.5 mg Intravenous Once   enalapril  20 mg Oral Daily   enoxaparin (LOVENOX) injection  40 mg Subcutaneous Q24H    escitalopram  5 mg Oral Daily   metoprolol succinate  25 mg Oral BID WC   pantoprazole  20 mg Oral Q supper   Continuous Infusions:     LOS: 0 days    Time spent: 30 mins    Wyvonnia Dusky, MD [facility] Pager [phone]  If 7PM-7AM, please contact night-coverage [email or web address] 07/29/2022, 7:27 AM

## 2022-07-29 NOTE — Plan of Care (Addendum)
  Problem: Education: Goal: Knowledge of General Education information will improve Description: Including pain rating scale, medication(s)/side effects and non-pharmacologic comfort measures Outcome: Not Progressing   Problem: Health Behavior/Discharge Planning: Goal: Ability to manage health-related needs will improve Outcome: Not Progressing   Problem: Clinical Measurements: Goal: Ability to maintain clinical measurements within normal limits will improve Outcome: Not Progressing Goal: Will remain free from infection Outcome: Not Progressing Goal: Diagnostic test results will improve Outcome: Not Progressing Goal: Respiratory complications will improve Outcome: Not Progressing Goal: Cardiovascular complication will be avoided Outcome: Not Progressing   Problem: Activity: Goal: Risk for activity intolerance will decrease Outcome: Not Progressing   Problem: Nutrition: Goal: Adequate nutrition will be maintained Outcome: Progressing   Problem: Coping: Goal: Level of anxiety will decrease Outcome: Not Progressing   Problem: Elimination: Goal: Will not experience complications related to bowel motility Outcome: Progressing Goal: Will not experience complications related to urinary retention Outcome: Progressing   Problem: Pain Managment: Goal: General experience of comfort will improve Outcome: Progressing   Problem: Safety: Goal: Ability to remain free from injury will improve Outcome: Not Progressing   Problem: Skin Integrity: Goal: Risk for impaired skin integrity will decrease Outcome: Progressing   Problem: Education: Goal: Knowledge of General Education information will improve Description: Including pain rating scale, medication(s)/side effects and non-pharmacologic comfort measures Outcome: Not Progressing Note: R/T dementia    Problem: Clinical Measurements: Goal: Ability to maintain clinical measurements within normal limits will improve Outcome:  Not Progressing   Problem: Pain Managment: Goal: General experience of comfort will improve Outcome: Progressing   Problem: Safety: Goal: Ability to remain free from injury will improve Outcome: Not Progressing Note: R/t multiple falls admitted for fall

## 2022-07-29 NOTE — Progress Notes (Addendum)
2000 patient alert able to follow commands alert to self only patient able to brush teeth after getting to [facility] with max assistance. Bath completed all linens changed

## 2022-07-30 DIAGNOSIS — R296 Repeated falls: Secondary | ICD-10-CM | POA: Diagnosis not present

## 2022-07-30 DIAGNOSIS — E440 Moderate protein-calorie malnutrition: Secondary | ICD-10-CM | POA: Insufficient documentation

## 2022-07-30 DIAGNOSIS — I4892 Unspecified atrial flutter: Secondary | ICD-10-CM | POA: Diagnosis not present

## 2022-07-30 DIAGNOSIS — I10 Essential (primary) hypertension: Secondary | ICD-10-CM | POA: Diagnosis not present

## 2022-07-30 LAB — COMPREHENSIVE METABOLIC PANEL
ALT: 117 U/L — ABNORMAL HIGH (ref 0–44)
AST: 124 U/L — ABNORMAL HIGH (ref 15–41)
Albumin: 2.6 g/dL — ABNORMAL LOW (ref 3.5–5.0)
Alkaline Phosphatase: 51 U/L (ref 38–126)
Anion gap: 8 (ref 5–15)
BUN: 17 mg/dL (ref 8–23)
CO2: 27 mmol/L (ref 22–32)
Calcium: 8.1 mg/dL — ABNORMAL LOW (ref 8.9–10.3)
Chloride: 104 mmol/L (ref 98–111)
Creatinine, Ser: 0.87 mg/dL (ref 0.44–1.00)
GFR, Estimated: >60 mL/min (ref >60)
Glucose, Bld: 95 mg/dL (ref 70–99)
Potassium: 3.5 mmol/L (ref 3.5–5.1)
Sodium: 139 mmol/L (ref 135–145)
Total Bilirubin: 0.8 mg/dL (ref 0.3–1.2)
Total Protein: 4.9 g/dL — ABNORMAL LOW (ref 6.5–8.1)

## 2022-07-30 LAB — CBC
HCT: 32 % — ABNORMAL LOW (ref 36.0–46.0)
Hemoglobin: 10.4 g/dL — ABNORMAL LOW (ref 12.0–15.0)
MCH: 30.1 pg (ref 26.0–34.0)
MCHC: 32.5 g/dL (ref 30.0–36.0)
MCV: 92.8 fL (ref 80.0–100.0)
Platelets: 114 10*3/uL — ABNORMAL LOW (ref 150–400)
RBC: 3.45 MIL/uL — ABNORMAL LOW (ref 3.87–5.11)
RDW: 14.2 % (ref 11.5–15.5)
WBC: 6 10*3/uL (ref 4.0–10.5)
nRBC: 0 % (ref 0.0–0.2)

## 2022-07-30 LAB — MAGNESIUM: Magnesium: 1.7 mg/dL (ref 1.7–2.4)

## 2022-07-30 MED ORDER — ADULT MULTIVITAMIN W/MINERALS CH
1.0000 | ORAL_TABLET | Freq: Every day | ORAL | Status: DC
  Administered 2022-07-30 – 2022-07-31 (×2): 1 via ORAL
  Filled 2022-07-30 (×2): qty 1

## 2022-07-30 MED ORDER — ENSURE ENLIVE PO LIQD
237.0000 mL | Freq: Three times a day (TID) | ORAL | Status: DC
  Administered 2022-07-31 (×2): 237 mL via ORAL

## 2022-07-30 NOTE — Progress Notes (Signed)
Physical Therapy Treatment Patient Details Name: Terry Kaiser MRN: [id number] DOB: 10-25-1935 Today's Date: 07/30/2022   History of Present Illness Pt admitted to [facility] on 07/27/22 under observation for mechanical fall, resulting in small laceration to posterior head. Hypokalemic upon admission, now improving. Imaging significant for mildly displaced spinous process of C7, possible subacute with no neck pain. Pt cleared for participation with therapy via attending MD.    PT Comments    Pt confused but followed 1-step commands well with extra time and cuing.  Pt required min A with sup to sit but no physical assist with transfers or gait this session and presented with significantly improved activity tolerance.  Pt was able to amb 2 x 80 feet with seated rest breaks between walks and one brief standing rest break during second walk.  Pt's SpO2 and HR were both WNL during the session with pt reporting no pain or other adverse symptoms.  Pt remains at an elevated risk for falls, however, and will benefit from PT services in a SNF setting upon discharge to safely address deficits listed in patient problem list for decreased caregiver assistance and eventual return to PLOF.     Recommendations for follow up therapy are one component of a multi-disciplinary discharge planning process, led by the attending physician.  Recommendations may be updated based on patient status, additional functional criteria and insurance authorization.  Follow Up Recommendations  Skilled nursing-short term rehab (<3 hours/day) Can patient physically be transported by private vehicle: Yes   Assistance Recommended at Discharge Frequent or constant Supervision/Assistance  Patient can return home with the following A little help with walking and/or transfers;A little help with bathing/dressing/bathroom;Assistance with cooking/housework;Help with stairs or ramp for entrance;Direct supervision/assist for medications  management   Equipment Recommendations  None recommended by PT    Recommendations for Other Services       Precautions / Restrictions Precautions Precautions: Fall Restrictions Weight Bearing Restrictions: No Other Position/Activity Restrictions: C7 spinal process fx, collar can be used for comfort per neuro note     Mobility  Bed Mobility Overal bed mobility: Needs Assistance Bed Mobility: Supine to Sit     Supine to sit: Min assist     General bed mobility comments: Min A for BLE and trunk control during sup to sit    Transfers Overall transfer level: Needs assistance Equipment used: Rolling walker (2 wheels) Transfers: Sit to/from Stand Sit to Stand: Min guard           General transfer comment: Min verbal and tactile cues for hand placement    Ambulation/Gait Ambulation/Gait assistance: Min guard Gait Distance (Feet): 80 Feet Assistive device: Rolling walker (2 wheels) Gait Pattern/deviations: Step-through pattern, Decreased step length - right, Decreased step length - left Gait velocity: decreased     General Gait Details: Slow cadence but steady with min multi-modal cues for amb closer to the RW with upright posture   Stairs             Wheelchair Mobility    Modified Rankin (Stroke Patients Only)       Balance Overall balance assessment: Needs assistance Sitting-balance support: No upper extremity supported, Feet supported Sitting balance-Leahy Scale: Good     Standing balance support: During functional activity, Bilateral upper extremity supported Standing balance-Leahy Scale: Fair                              Cognition Arousal/Alertness: Awake/alert Behavior  During Therapy: WFL for tasks assessed/performed Overall Cognitive Status: No family/caregiver present to determine baseline cognitive functioning                                          Exercises Total Joint Exercises Ankle Circles/Pumps:  AROM, Strengthening, Both, 10 reps Hip ABduction/ADduction: AROM, AAROM, Both, 10 reps Straight Leg Raises: AROM, AAROM, Both, 10 reps Long Arc Quad: AROM, Both, 10 reps, Strengthening, 15 reps Knee Flexion: AROM, Strengthening, 10 reps, Both, 15 reps    General Comments        Pertinent Vitals/Pain Pain Assessment Pain Assessment: No/denies pain    Home Living                          Prior Function            PT Goals (current goals can now be found in the care plan section) Progress towards PT goals: Progressing toward goals    Frequency    Min 2X/week      PT Plan Current plan remains appropriate    Co-evaluation              AM-PAC PT "6 Clicks" Mobility   Outcome Measure  Help needed turning from your back to your side while in a flat bed without using bedrails?: A Little Help needed moving from lying on your back to sitting on the side of a flat bed without using bedrails?: A Little Help needed moving to and from a bed to a chair (including a wheelchair)?: A Little Help needed standing up from a chair using your arms (e.g., wheelchair or bedside chair)?: A Little Help needed to walk in hospital room?: A Little Help needed climbing 3-5 steps with a railing? : A Lot 6 Click Score: 17    End of Session Equipment Utilized During Treatment: Gait belt Activity Tolerance: Patient tolerated treatment well Patient left: in chair;with call bell/phone within reach;with chair alarm set Nurse Communication: Mobility status PT Visit Diagnosis: Unsteadiness on feet (R26.81);Repeated falls (R29.6);Muscle weakness (generalized) (M62.81);History of falling (Z91.81);Pain     Time: 4562-5638 PT Time Calculation (min) (ACUTE ONLY): 23 min  Charges:  $Gait Training: 8-22 mins $Therapeutic Exercise: 8-22 mins                    D. Scott Syncere Eble PT, DPT 07/30/22, 11:09 AM

## 2022-07-30 NOTE — Plan of Care (Signed)

## 2022-07-30 NOTE — TOC Progression Note (Signed)
Transition of Care [facility]) - Progression Note    Patient Details  Name: Terry Kaiser MRN: [id number] Date of Birth: Jun 15, 1936  Transition of Care [facility]) CM/SW [place], [place] Phone Number: 07/30/2022, 2:38 PM  Clinical Narrative:      SW met with the patient and called her son regarding accepting SNFs. Her son would like [facility] as the patient has been to this facility before.      Expected Discharge Plan and [facility] [facility]                                       Social Determinants of Health (SDOH) Interventions    Readmission Risk Interventions    01/20/2022   12:26 PM  Readmission Risk Prevention Plan  Transportation Screening Complete  PCP or Specialist Appt within 5-7 Days Complete  Home Care Screening Complete  Medication Review (RN CM) Complete

## 2022-07-30 NOTE — Progress Notes (Signed)
Initial Nutrition Assessment  DOCUMENTATION CODES:   Non-severe (moderate) malnutrition in context of social or environmental circumstances  INTERVENTION:   -Ensure Enlive po TID, each supplement provides 350 kcal and 20 grams of protein -MVI with minerals daily -Liberalize diet to regular for widest variety of meal selections  NUTRITION DIAGNOSIS:   Moderate Malnutrition related to social / environmental circumstances as evidenced by mild fat depletion, moderate fat depletion, mild muscle depletion, moderate muscle depletion, percent weight loss.  GOAL:   Patient will meet greater than or equal to 90% of their needs  MONITOR:   PO intake, Supplement acceptance  REASON FOR ASSESSMENT:   Malnutrition Screening Tool    ASSESSMENT:   Ptwith medical history significant for type 2 diabetes mellitus, GERD, hypertension and osteoarthritis, who presented with acute onset of unwitnessed fall.  Pt admitted with recurrent falls and C7 fracture.   Reviewed I/O's: +1.2 L since admission   Per neurosurgery notes, plan c-collar for comfort. No plans for surgical interventions.   Spoke with pt at bedside, who easily wakened, but unable to provide significant history. No supports at bedside. Pt would answer RD questions, but provide nonsensical answers.   Pt currently on a heart healthy diet, consuming 100% of meals. Pt was residing in SNF [facility]) PTA.   Reviewed wt hx; pt has experienced a 17.6% wt loss over the past 6 months, which is significant for time frame.   Medications reviewed and include vitamin C, calcium with vitamin D, and vitamin B-12.    No results found for: "HGBA1C" PTA DM medications are none.   Labs reviewed: CBGS: 183 (inpatient orders for glycemic control are none).    NUTRITION - FOCUSED PHYSICAL EXAM:  Flowsheet Row Most Recent Value  Orbital Region Moderate depletion  Upper Arm Region Moderate depletion  Thoracic and Lumbar Region Mild  depletion  Buccal Region Moderate depletion  Temple Region Mild depletion  Clavicle Bone Region Moderate depletion  Clavicle and Acromion Bone Region Moderate depletion  Scapular Bone Region Moderate depletion  Dorsal Hand Mild depletion  Patellar Region Moderate depletion  Anterior Thigh Region Moderate depletion  Posterior Calf Region Moderate depletion  Edema (RD Assessment) None  Hair Reviewed  Eyes Reviewed  Mouth Reviewed  Skin Reviewed  Nails Reviewed       Diet Order:   Diet Order             Diet Heart Room service appropriate? Yes; Fluid consistency: Thin  Diet effective now                   EDUCATION NEEDS:   No education needs have been identified at this time  Skin:  Skin Assessment: Reviewed RN Assessment  Last BM:  Unknown  Height:   Ht Readings from Last 1 Encounters:  07/27/22 '5\' 6"'$  (1.676 m)    Weight:   Wt Readings from Last 1 Encounters:  07/27/22 63.5 kg    Ideal Body Weight:  59.1 kg  BMI:  Body mass index is 22.6 kg/m.  Estimated Nutritional Needs:   Kcal:  6578-4696  Protein:  90-105 grams  Fluid:  > 1.7 L    Loistine Chance, RD, LDN, [place] Registered Dietitian II Certified Diabetes Care and Education Specialist Please refer to [facility] for RD and/or RD on-call/weekend/after hours pager

## 2022-07-30 NOTE — Progress Notes (Addendum)
PROGRESS NOTE    Terry Kaiser  ZSW:[id number] DOB: 01-29-1936 DOA: 07/27/2022 PCP: Dion Body, MD   Assessment & Plan:   Principal Problem:   Recurrent falls Active Problems:   Hypokalemia   Hypomagnesemia   Essential hypertension   GERD without esophagitis   Atrial fibrillation and flutter ([place])  Assessment and Plan: Recurrent falls: etiology unclear. PT/OT recs SNF   C7 fracture: mildly displaced involving the spinous process of C7, possibly subacute. No neck pain. No surgery needed. Collar for comfort as per neuro surg. Will need outpatient w/ neuro surg, Dr. Izora Ribas   Hypokalemia: WNL today   Hypomagnesemia: WNL today    A. flutter: unspecified. Continue on home dose of amio, metoprolol    GERD: continue on PPI    HTN: continue on home dose of metoprolol, enalapril  Thrombocytopenia: etiology unclear. Labile. Will continue to monitor   Transaminitis: etiology unclear, trending down slowly. Continue to hold statin   Moderate malnutrition: continue on nutritional supplements  DVT prophylaxis: lovenox  Code Status: DNR Family Communication: discussed pt's care w/ pt's son, Merry Proud, and answered his questions  Disposition Plan: likely d/c to SNF   Level of care: Med-Surg  Status is: Observation The patient remains OBS appropriate and will d/c before 2 midnights.    Consultants:  Neuro surg   Procedures:  Antimicrobials:   Subjective: Pt c/o fatigue   Objective: Vitals:   07/29/22 2101 07/29/22 2110 07/30/22 0446 07/30/22 0728  BP: 132/69  (!) 149/54 (!) 157/63  Pulse: (!) 50  (!) 52 (!) 52  Resp:  15  19  Temp: 98 F (36.7 C)  97.7 F (36.5 C) 98.4 F (36.9 C)  TempSrc:      SpO2: 98%  98% 96%  Weight:      Height:       No intake or output data in the 24 hours ending 07/30/22 1136  Filed Weights   07/27/22 1934  Weight: 63.5 kg    Examination:  General exam: Appears calm & comfortable. Hard of hearing   Respiratory  system: clear breath sounds b/l. No wheezes, rales  Cardiovascular system: irregular. No rubs or gallops  Gastrointestinal system: Abd is soft, NT, ND & normal bowel sounds  Central nervous system: Alert and awake. Moves all extremities   Psychiatry:  judgement and insight appears at baseline. Flat mood and affect     Data Reviewed: I have personally reviewed following labs and imaging studies  CBC: Recent Labs  Lab 07/27/22 2157 07/28/22 0545 07/29/22 0518 07/30/22 0357  WBC 9.7 7.6 7.1 6.0  HGB 12.4 11.2* 10.9* 10.4*  HCT 38.2 34.5* 33.7* 32.0*  MCV 92.7 92.2 92.3 92.8  PLT 149* 128* 117* 322*   Basic Metabolic Panel: Recent Labs  Lab 07/27/22 2157 07/28/22 0545 07/29/22 0518 07/30/22 0357  NA 137 138 137 139  K 2.6* 3.0* 3.7 3.5  CL 98 104 105 104  CO2 '28 26 27 27  '$ GLUCOSE 115* 105* 93 95  BUN '17 14 12 17  '$ CREATININE 0.98 0.75 0.76 0.87  CALCIUM 8.5* 8.0* 8.1* 8.1*  MG 1.5* 1.9 1.9 1.7   GFR: Estimated Creatinine Clearance: 45.1 mL/min (by C-G formula based on SCr of 0.87 mg/dL). Liver Function Tests: Recent Labs  Lab 07/27/22 2157 07/29/22 0518 07/30/22 0357  AST 133* 131* 124*  ALT 112* 118* 117*  ALKPHOS 66 56 51  BILITOT 1.1 1.1 0.8  PROT 6.4* 5.5* 4.9*  ALBUMIN 3.5 3.0* 2.6*  No results for input(s): "LIPASE", "AMYLASE" in the last 168 hours. No results for input(s): "AMMONIA" in the last 168 hours. Coagulation Profile: No results for input(s): "INR", "PROTIME" in the last 168 hours. Cardiac Enzymes: No results for input(s): "CKTOTAL", "CKMB", "CKMBINDEX", "TROPONINI" in the last 168 hours. BNP (last 3 results) No results for input(s): "PROBNP" in the last 8760 hours. HbA1C: No results for input(s): "HGBA1C" in the last 72 hours. CBG: No results for input(s): "GLUCAP" in the last 168 hours. Lipid Profile: No results for input(s): "CHOL", "HDL", "LDLCALC", "TRIG", "CHOLHDL", "LDLDIRECT" in the last 72 hours. Thyroid Function Tests: No  results for input(s): "TSH", "T4TOTAL", "FREET4", "T3FREE", "THYROIDAB" in the last 72 hours. Anemia Panel: No results for input(s): "VITAMINB12", "FOLATE", "FERRITIN", "TIBC", "IRON", "RETICCTPCT" in the last 72 hours. Sepsis Labs: No results for input(s): "PROCALCITON", "LATICACIDVEN" in the last 168 hours.  Recent Results (from the past 240 hour(s))  MRSA Next Gen by PCR, Nasal     Status: None   Collection Time: 07/29/22  5:05 AM   Specimen: Nasal Mucosa; Nasal Swab  Result Value Ref Range Status   MRSA by PCR Next Gen NOT DETECTED NOT DETECTED Final    Comment: (NOTE) The GeneXpert MRSA Assay (FDA approved for NASAL specimens only), is one component of a comprehensive MRSA colonization surveillance program. It is not intended to diagnose MRSA infection nor to guide or monitor treatment for MRSA infections. Test performance is not FDA approved in patients less than 29 years old. Performed at [facility], [address] [place], [place] [address]          Radiology Studies: No results found.      Scheduled Meds:  amiodarone  200 mg Oral BID   ascorbic acid  500 mg Oral BID WC   calcium-vitamin D  1 tablet Oral BID WC   cyanocobalamin  500 mcg Oral Daily   diazepam  2.5 mg Intravenous Once   enalapril  20 mg Oral Daily   enoxaparin (LOVENOX) injection  40 mg Subcutaneous Q24H   escitalopram  5 mg Oral Daily   metoprolol succinate  25 mg Oral BID WC   pantoprazole  20 mg Oral Q supper   Continuous Infusions:     LOS: 0 days    Time spent: 25 mins    Wyvonnia Dusky, MD [facility] Pager [phone]  If 7PM-7AM, please contact night-coverage [email or web address] 07/30/2022, 11:36 AM

## 2022-07-31 DIAGNOSIS — S12630D Unspecified traumatic displaced spondylolisthesis of seventh cervical vertebra, subsequent encounter for fracture with routine healing: Secondary | ICD-10-CM | POA: Diagnosis not present

## 2022-07-31 DIAGNOSIS — E876 Hypokalemia: Secondary | ICD-10-CM | POA: Diagnosis not present

## 2022-07-31 DIAGNOSIS — I4819 Other persistent atrial fibrillation: Secondary | ICD-10-CM | POA: Diagnosis not present

## 2022-07-31 DIAGNOSIS — J449 Chronic obstructive pulmonary disease, unspecified: Secondary | ICD-10-CM | POA: Diagnosis not present

## 2022-07-31 DIAGNOSIS — Z7901 Long term (current) use of anticoagulants: Secondary | ICD-10-CM | POA: Diagnosis not present

## 2022-07-31 DIAGNOSIS — K219 Gastro-esophageal reflux disease without esophagitis: Secondary | ICD-10-CM | POA: Diagnosis not present

## 2022-07-31 DIAGNOSIS — I4892 Unspecified atrial flutter: Secondary | ICD-10-CM | POA: Diagnosis not present

## 2022-07-31 DIAGNOSIS — I10 Essential (primary) hypertension: Secondary | ICD-10-CM | POA: Diagnosis not present

## 2022-07-31 DIAGNOSIS — S12600A Unspecified displaced fracture of seventh cervical vertebra, initial encounter for closed fracture: Secondary | ICD-10-CM | POA: Diagnosis not present

## 2022-07-31 DIAGNOSIS — E119 Type 2 diabetes mellitus without complications: Secondary | ICD-10-CM | POA: Diagnosis not present

## 2022-07-31 DIAGNOSIS — F0390 Unspecified dementia without behavioral disturbance: Secondary | ICD-10-CM | POA: Diagnosis not present

## 2022-07-31 DIAGNOSIS — I482 Chronic atrial fibrillation, unspecified: Secondary | ICD-10-CM | POA: Diagnosis not present

## 2022-07-31 DIAGNOSIS — F411 Generalized anxiety disorder: Secondary | ICD-10-CM | POA: Diagnosis not present

## 2022-07-31 DIAGNOSIS — R296 Repeated falls: Secondary | ICD-10-CM | POA: Diagnosis not present

## 2022-07-31 DIAGNOSIS — I351 Nonrheumatic aortic (valve) insufficiency: Secondary | ICD-10-CM | POA: Diagnosis not present

## 2022-07-31 DIAGNOSIS — E782 Mixed hyperlipidemia: Secondary | ICD-10-CM | POA: Diagnosis not present

## 2022-07-31 DIAGNOSIS — S12690D Other displaced fracture of seventh cervical vertebra, subsequent encounter for fracture with routine healing: Secondary | ICD-10-CM | POA: Diagnosis not present

## 2022-07-31 DIAGNOSIS — Z7401 Bed confinement status: Secondary | ICD-10-CM | POA: Diagnosis not present

## 2022-07-31 DIAGNOSIS — R4182 Altered mental status, unspecified: Secondary | ICD-10-CM | POA: Diagnosis not present

## 2022-07-31 DIAGNOSIS — W19XXXD Unspecified fall, subsequent encounter: Secondary | ICD-10-CM | POA: Diagnosis not present

## 2022-07-31 DIAGNOSIS — E440 Moderate protein-calorie malnutrition: Secondary | ICD-10-CM | POA: Diagnosis not present

## 2022-07-31 DIAGNOSIS — F03A4 Unspecified dementia, mild, with anxiety: Secondary | ICD-10-CM | POA: Diagnosis not present

## 2022-07-31 LAB — CBC
HCT: 30.9 % — ABNORMAL LOW (ref 36.0–46.0)
Hemoglobin: 10.3 g/dL — ABNORMAL LOW (ref 12.0–15.0)
MCH: 30.3 pg (ref 26.0–34.0)
MCHC: 33.3 g/dL (ref 30.0–36.0)
MCV: 90.9 fL (ref 80.0–100.0)
Platelets: 112 10*3/uL — ABNORMAL LOW (ref 150–400)
RBC: 3.4 MIL/uL — ABNORMAL LOW (ref 3.87–5.11)
RDW: 13.9 % (ref 11.5–15.5)
WBC: 6.8 10*3/uL (ref 4.0–10.5)
nRBC: 0 % (ref 0.0–0.2)

## 2022-07-31 LAB — COMPREHENSIVE METABOLIC PANEL
ALT: 105 U/L — ABNORMAL HIGH (ref 0–44)
AST: 101 U/L — ABNORMAL HIGH (ref 15–41)
Albumin: 2.6 g/dL — ABNORMAL LOW (ref 3.5–5.0)
Alkaline Phosphatase: 46 U/L (ref 38–126)
Anion gap: 6 (ref 5–15)
BUN: 16 mg/dL (ref 8–23)
CO2: 26 mmol/L (ref 22–32)
Calcium: 7.9 mg/dL — ABNORMAL LOW (ref 8.9–10.3)
Chloride: 105 mmol/L (ref 98–111)
Creatinine, Ser: 0.67 mg/dL (ref 0.44–1.00)
GFR, Estimated: >60 mL/min (ref >60)
Glucose, Bld: 109 mg/dL — ABNORMAL HIGH (ref 70–99)
Potassium: 3.3 mmol/L — ABNORMAL LOW (ref 3.5–5.1)
Sodium: 137 mmol/L (ref 135–145)
Total Bilirubin: 1.1 mg/dL (ref 0.3–1.2)
Total Protein: 5 g/dL — ABNORMAL LOW (ref 6.5–8.1)

## 2022-07-31 LAB — MAGNESIUM: Magnesium: 1.4 mg/dL — ABNORMAL LOW (ref 1.7–2.4)

## 2022-07-31 MED ORDER — POTASSIUM CHLORIDE CRYS ER 20 MEQ PO TBCR
40.0000 meq | EXTENDED_RELEASE_TABLET | Freq: Once | ORAL | Status: AC
  Administered 2022-07-31: 40 meq via ORAL
  Filled 2022-07-31: qty 2

## 2022-07-31 MED ORDER — LOVASTATIN 40 MG PO TABS: 40.0000 mg | ORAL_TABLET | Freq: Every day | ORAL | Status: DC

## 2022-07-31 NOTE — Progress Notes (Signed)
   07/31/22 1249  Medical Necessity for Transport Certificate --- IF THIS TRANSPORT IS ROUND TRIP OR SCHEDULED AND REPEATED, A PHYSICIAN MUST COMPLETE THIS FORM  Transport from: Teacher, English as a foreign language) Doctor, hospital to (Location) Other (Comment) Oceanographer)  Did the patient arrive from a [place], [place] or Group Home? No  Is this the closest appropriate facility? Yes  Date of Transport Service 07/31/22  Name of [facility]  Round Trip Transport? No  Reason for Transport Discharge  Is this a hospice patient? No  Describe the Medical Condition falls, atrial fibrillation, C7 fracture  Q1 Are ALL the following "true"? 1. Patient unable to get up from bed without assistance  AND  2. Unable to ambulate  AND  3. Unable to sit in a chair, including wheelchair. Yes  Q2 Could the patient be transported safely by other means of transportation (I.E., wheelchair van)? No  Q3 Please check any of the following conditions that apply at the time of transport: Risk of injury to self and/or others;Other (Comment) (falls, C7 Fracture, atrial fibrillation)  Electronic Signature Colen Darling  Credentials SW  Date Signed 07/31/22

## 2022-07-31 NOTE — NC FL2 (Signed)
[place] LEVEL OF CARE SCREENING TOOL     IDENTIFICATION  Patient Name: Terry Kaiser Birthdate: 03-17-1936 Sex: female Admission Date (Current Location): 07/27/2022  [facility] and [place] Number:  Engineering geologist and Address:  [facility], [address] [place], [place] [address]      Provider Number: [id number]  Attending Physician Name and Address:  Wyvonnia Dusky, MD  Relative Name and Phone Number:  Sharilynn, [place] [phone])   [phone] [facility] Phone)    Current Level of Care: Hospital Recommended Level of Care: [place] Prior Approval Number: [id number]  Date Approved/Denied:   PASRR Number: [id number]  Discharge Plan: SNF    Current Diagnoses: Patient Active Problem List   Diagnosis Date Noted   Malnutrition of moderate degree 07/30/2022   Closed displaced fracture of seventh cervical vertebra (HCC)    Weakness    Recurrent falls 07/27/2022   Hypomagnesemia 07/27/2022   Atrial fibrillation and flutter ([place]) 07/27/2022   GERD without esophagitis 01/19/2022   Diverticulosis 01/19/2022   Type 2 diabetes mellitus with hyperlipidemia ([place]) 01/19/2022   Fall 01/19/2022   Known medical problems 01/19/2022   Dementia without behavioral disturbance ([place]) 01/19/2022   Hypokalemia 01/19/2022   Ecchymosis 01/19/2022   GAD (generalized anxiety disorder) 01/15/2022   Persistent atrial fibrillation ([place]) 09/25/2021   Bilateral leg edema 05/22/2021   Mild aortic regurgitation 01/31/2021   Nonrheumatic mitral valve regurgitation 01/31/2021   Emphysema of lung ([place]) 12/05/2020   DNR (do not resuscitate) 07/01/2018   History of normocytic normochromic anemia 07/01/2018   Essential hypertension 07/01/2014    Orientation RESPIRATION BLADDER Height & Weight     Self  Normal Continent Weight: 140 lb (63.5 kg) Height:  '5\' 6"'$  (167.6 cm)  BEHAVIORAL SYMPTOMS/MOOD NEUROLOGICAL BOWEL NUTRITION STATUS       Continent Diet  AMBULATORY STATUS COMMUNICATION OF NEEDS Skin   Limited Assist Verbally Normal                       Personal Care Assistance Level of Assistance  Dressing, Bathing Bathing Assistance: Maximum assistance Feeding assistance: Independent Dressing Assistance: Limited assistance     Functional Limitations Info  Hearing   Hearing Info: Impaired (hearing aids)      [place]  PT (By licensed PT), OT (By licensed OT)     PT Frequency: 5x/week OT Frequency: 5x/week            Contractures Contractures Info: Not present    Additional Factors Info  Code Status, Allergies Code Status Info: DNR Allergies Info: No Known Allergies           Current Medications (07/31/2022):  This is the current hospital active medication list Current Facility-Administered Medications  Medication Dose Route Frequency Provider Last Rate Last Admin   acetaminophen (TYLENOL) tablet 650 mg  650 mg Oral Q6H PRN Mansy, Jan A, MD       Or   acetaminophen (TYLENOL) suppository 650 mg  650 mg Rectal Q6H PRN Mansy, Jan A, MD       amiodarone (PACERONE) tablet 200 mg  200 mg Oral BID Mansy, Jan A, MD   200 mg at 07/30/22 2051   ascorbic acid (VITAMIN C) tablet 500 mg  500 mg Oral BID WC Mansy, Jan A, MD   500 mg at 07/30/22 1736   calcium-vitamin D (OSCAL WITH D) 500-5 MG-MCG per tablet 1 tablet  1 tablet Oral BID  WC Mansy, Jan A, MD   1 tablet at 07/30/22 1736   cyanocobalamin (VITAMIN B12) tablet 500 mcg  500 mcg Oral Daily Mansy, Jan A, MD   500 mcg at 07/30/22 1009   enalapril (VASOTEC) tablet 20 mg  20 mg Oral Daily Mansy, Jan A, MD   20 mg at 07/30/22 1010   enoxaparin (LOVENOX) injection 40 mg  40 mg Subcutaneous Q24H Mansy, Jan A, MD   40 mg at 07/30/22 1010   escitalopram (LEXAPRO) tablet 5 mg  5 mg Oral Daily Mansy, Jan A, MD   5 mg at 07/30/22 1009   feeding supplement (ENSURE ENLIVE / ENSURE PLUS) liquid 237 mL  237 mL Oral TID BM Wyvonnia Dusky,  MD       magnesium hydroxide (MILK OF MAGNESIA) suspension 30 mL  30 mL Oral Daily PRN Mansy, Jan A, MD       metoprolol succinate (TOPROL-XL) 24 hr tablet 25 mg  25 mg Oral BID WC Mansy, Jan A, MD   25 mg at 07/30/22 1736   multivitamin with minerals tablet 1 tablet  1 tablet Oral Daily Wyvonnia Dusky, MD   1 tablet at 07/30/22 1736   pantoprazole (PROTONIX) EC tablet 20 mg  20 mg Oral Q supper Mansy, Jan A, MD   20 mg at 07/30/22 1736   potassium chloride SA (KLOR-CON M) CR tablet 40 mEq  40 mEq Oral Once Wyvonnia Dusky, MD       traZODone (DESYREL) tablet 25 mg  25 mg Oral QHS PRN Mansy, Jan A, MD   25 mg at 07/30/22 2051     Discharge Medications: Please see discharge summary for a list of discharge medications.  Relevant Imaging Results:  Relevant Lab Results:   Additional Information SS #: [id number], LCSWA

## 2022-07-31 NOTE — TOC Transition Note (Signed)
Transition of Care [facility]) - CM/SW Discharge Note   Patient Details  Name: Terry Kaiser MRN: [id number] Date of Birth: [birth year]  Transition of Care [facility]) CM/SW Contact:  Colen Darling, [place] Phone Number: 07/31/2022, 2:28 PM   Clinical Narrative:       Final next level of care: Skilled Nursing Facility Barriers to Discharge: Barriers Resolved   Patient Goals and CMS Choice Patient states their goals for this hospitalization and ongoing recovery are:: attend rehab CMS Medicare.gov Compare Post Acute Care list provided to:: Patient Represenative (must comment) [name]) Choice offered to / list presented to : Adult Children  Discharge Placement PASRR number recieved: 07/31/22 Existing PASRR number confirmed : 07/31/22            Patient to be transferred to facility by: [facility] Name of family member notified: [name] Patient and family notified of of transfer: 07/31/22  Discharge Plan and [place] [facility] with [facility]                  Social Determinants of Health ([place]) Interventions     Readmission Risk Interventions    01/20/2022   12:26 PM  Readmission Risk Prevention Plan  Transportation Screening Complete  PCP or Specialist Appt within 5-7 Days Complete  Home Care Screening Complete  Medication Review (RN CM) Complete

## 2022-07-31 NOTE — Discharge Summary (Signed)
Physician Discharge Summary  Terry Kaiser BPZ:[id number] DOB: 06/26/1936 DOA: 07/27/2022  PCP: Dion Body, MD  Admit date: 07/27/2022 Discharge date: 07/31/2022  Admitted From: ALF Disposition:  SNF  Recommendations for Outpatient Follow-up:  Follow up with PCP in 1-2 weeks F/u w/ neuro surg, Dr. Izora Ribas, in 1-2 weeks Hold home dose lovastatin secondary to elevated liver enzymes   Home Health: no  Equipment/Devices:  Discharge Condition: stable  CODE STATUS: DNT Diet recommendation: Heart Healthy  Brief/Interim Summary: HPI was taken from Dr. Sidney Ace: Terry Kaiser is a 86 y.o. Caucasian female with medical history significant for type 2 diabetes mellitus, GERD, hypertension and osteoarthritis, who presented to the emergency room with acute onset of unwitnessed fall.  The patient denied any presyncope or syncope or head injury.  He is not sure how she fell.  She has been having recurrent falls lately and generalized weakness per her son, over the last couple of months.  She sustained a posterior scalp laceration at this time that was managed by pressure with adequate hemostasis.  No chest pain or dyspnea or palpitations.  No paresthesias or focal muscle weakness.  No dysuria, dysuria or hematuria, urgency or frequency or flank pain.  About a month ago she fell and had severe neck pain at that time and since then has not had any neck pain at least over the last 1 to 2 weeks.   ED Course: When she came to the ER, Vital signs were within normal.  Labs revealed hypokalemia of 2.6 and elevated AST of 133, ALT of 112 and her magnesium was low at 1.5.  Total protein was 6.4 with albumin of 3.5.  High sensitive troponin I was 17 and CBC showed mild thrombocytopenia of 149 and was otherwise unremarkable. EKG as reviewed by me : EKG showed atrial flutter with variable AV block and a rate of 63 with prolonged QT interval with QTc 654 MS and T wave inversion inferiorly. Imaging:  Noncontrasted CT scan revealed atrophy and mild chronic small vessel ischemic changes of the white matter with no acute intracranial abnormality.  C-spine CT showed mildly displaced fracture involving the spinous process of C7 extending to the margin of the inferior facet on the right side at C7 with questionable small amount of periosteal new bone formation on some of the axial images raising the possibility of subacute process.   The patient was given 40 mill [place] p.o. potassium chloride and 10 mill equivalent IV as well as 1 L bolus of IV normal saline.   As per Dr. Jimmye Norman 10/21-10/24/23: Pt presented after recurrent falls at her ALF. PT/OT evaluated the pt and recommended SNF. Of note pt was found to have a mildly displaced fracture involving the spinous process of C7. Collar for comfort as per neuro surg. No surgery needed. Will need outpatient f/u w/ neuro surg, Dr. Izora Ribas, in 1-2 weeks.     Discharge Diagnoses:  Principal Problem:   Recurrent falls Active Problems:   Hypokalemia   Hypomagnesemia   Essential hypertension   GERD without esophagitis   Atrial fibrillation and flutter (HCC)   Malnutrition of moderate degree  Recurrent falls: etiology unclear. PT/OT recs SNF   C7 fracture: mildly displaced fracture involving the spinous process of C7, possibly subacute. No neck pain. No surgery needed. Collar for comfort as per neuro surg. Will need outpatient w/ neuro surg, Dr. Izora Ribas   Hypokalemia: potassium given   Hypomagnesemia: encourage po intake    A. flutter: unspecified. Continue  on home dose of amio, metoprolol    GERD: continue on PPI    HTN: continue on home dose of metoprolol, enalapril  Thrombocytopenia: etiology unclear. Labile. Will continue to monitor   Transaminitis: etiology unclear, trending down slowly. Continue to hold statin   Moderate malnutrition: continue on nutritional supplements  Discharge Instructions  Discharge Instructions      Diet - low sodium heart healthy   Complete by: As directed    Discharge instructions   Complete by: As directed    F/u w/ PCP in 1-2 weeks. Hold home dose of lovastatin secondary to elevated liver enzymes that trending down slowly. F/u w/ neuro surg, Dr. Izora Ribas, in 1-2 weeks.   Increase activity slowly   Complete by: As directed       Allergies as of 07/31/2022   No Known Allergies      Medication List     STOP taking these medications    potassium chloride 10 MEQ tablet Commonly known as: KLOR-CON M       TAKE these medications    acetaminophen 650 MG CR tablet Commonly known as: TYLENOL Take 650 mg by mouth every 6 (six) hours as needed.   alendronate 70 MG tablet Commonly known as: FOSAMAX Take 70 mg by mouth once a week. Take with a full glass of water on an empty stomach.   amiodarone 200 MG tablet Commonly known as: PACERONE Take 200 mg by mouth 2 (two) times daily. What changed: Another medication with the same name was removed. Continue taking this medication, and follow the directions you see here.   ascorbic acid 500 MG tablet Commonly known as: VITAMIN C Take 500 mg by mouth 2 (two) times daily.   aspirin EC 81 MG tablet Take 81 mg by mouth daily.   Calcium Carb-Cholecalciferol 600-400 MG-UNIT Tabs Take 1 tablet by mouth 2 (two) times daily.   diclofenac Sodium 1 % Gel Commonly known as: VOLTAREN Apply 2 g topically 4 (four) times daily.   enalapril 20 MG tablet Commonly known as: VASOTEC Take 20 mg by mouth daily.   escitalopram 5 MG tablet Commonly known as: LEXAPRO Take 5 mg by mouth daily.   lovastatin 40 MG tablet Commonly known as: MEVACOR Take 1 tablet (40 mg total) by mouth at bedtime. Hold this medication until you see your PCP What changed: additional instructions   memantine 5 MG tablet Commonly known as: NAMENDA Take 5 mg by mouth 2 (two) times daily.   metoprolol succinate 25 MG 24 hr tablet Commonly known as:  TOPROL-XL Take 25 mg by mouth 2 (two) times daily. Take with or immediately following a meal.   ondansetron 4 MG disintegrating tablet Commonly known as: ZOFRAN-ODT Take 4 mg by mouth every 8 (eight) hours as needed.   pantoprazole 20 MG tablet Commonly known as: PROTONIX Take 20 mg by mouth daily. dinner   vitamin B-12 500 MCG tablet Commonly known as: CYANOCOBALAMIN Take 500 mcg by mouth daily.        Follow-up Information     Meade Maw, MD Follow up.   Specialty: Neurosurgery Why: F/u in 1-2 weeks Contact information: [address] [place] [address] [phone]         Dion Body, MD Follow up.   Specialty: Family Medicine Why: F/u in 1-2 weeks Contact information: [place] [address] [phone]                No Known  Allergies  Consultations: Neuro surg    Procedures/Studies: CT Head Wo Contrast  Result Date: 07/27/2022 CLINICAL DATA:  Trauma fall laceration to posterior head EXAM: CT HEAD WITHOUT CONTRAST CT CERVICAL SPINE WITHOUT CONTRAST TECHNIQUE: Multidetector CT imaging of the head and cervical spine was performed following the standard protocol without intravenous contrast. Multiplanar CT image reconstructions of the cervical spine were also generated. RADIATION DOSE REDUCTION: This exam was performed according to the departmental dose-optimization program which includes automated exposure control, adjustment of the mA and/or kV according to patient size and/or use of iterative reconstruction technique. COMPARISON:  CT brain and cervical spine 01/19/2022 FINDINGS: CT HEAD FINDINGS Brain: No acute territorial infarction, hemorrhage or intracranial mass. Atrophy and mild chronic small vessel ischemic changes of the white matter. The ventricles are nonenlarged Vascular: No hyperdense vessels. Vertebral and carotid vascular calcification Skull: Normal. Negative for fracture or focal lesion.  Sinuses/Orbits: No acute finding. Other: Small posterior scalp laceration near vertex CT CERVICAL SPINE FINDINGS Alignment: Trace anterolisthesis C4 on C5. Facet alignment is within normal limits. Skull base and vertebrae: Vertebral body heights are maintained. Fracture through the spinous process of C7, extends to the inferior margin of the right C7 inferior facet. Some periosteal new bone formation is suggested on axial series 6, image 76 suggestive of subacute fracture. No other discrete fracture is visualized Soft tissues and spinal canal: No prevertebral fluid or swelling. No visible canal hematoma. Disc levels: Multilevel degenerative change. Moderate disc space narrowing C5-C6. mild disc space narrowing at C6-C7. Facet degenerative changes at multiple levels with foraminal narrowing. Upper chest: Negative. Other: None IMPRESSION: 1. No CT evidence for acute intracranial abnormality. Atrophy and mild chronic small vessel ischemic changes of the white matter 2. Mildly displaced fracture involving the spinous process of C7, extends to the margin of the inferior facet on the right side at C7. Question small amount of periosteal new bone formation on some of the axial images, raising possibility of subacute process. Electronically Signed   By: Donavan Foil M.D.   On: 07/27/2022 20:21   CT Cervical Spine Wo Contrast  Result Date: 07/27/2022 CLINICAL DATA:  Trauma fall laceration to posterior head EXAM: CT HEAD WITHOUT CONTRAST CT CERVICAL SPINE WITHOUT CONTRAST TECHNIQUE: Multidetector CT imaging of the head and cervical spine was performed following the standard protocol without intravenous contrast. Multiplanar CT image reconstructions of the cervical spine were also generated. RADIATION DOSE REDUCTION: This exam was performed according to the departmental dose-optimization program which includes automated exposure control, adjustment of the mA and/or kV according to patient size and/or use of iterative  reconstruction technique. COMPARISON:  CT brain and cervical spine 01/19/2022 FINDINGS: CT HEAD FINDINGS Brain: No acute territorial infarction, hemorrhage or intracranial mass. Atrophy and mild chronic small vessel ischemic changes of the white matter. The ventricles are nonenlarged Vascular: No hyperdense vessels. Vertebral and carotid vascular calcification Skull: Normal. Negative for fracture or focal lesion. Sinuses/Orbits: No acute finding. Other: Small posterior scalp laceration near vertex CT CERVICAL SPINE FINDINGS Alignment: Trace anterolisthesis C4 on C5. Facet alignment is within normal limits. Skull base and vertebrae: Vertebral body heights are maintained. Fracture through the spinous process of C7, extends to the inferior margin of the right C7 inferior facet. Some periosteal new bone formation is suggested on axial series 6, image 76 suggestive of subacute fracture. No other discrete fracture is visualized Soft tissues and spinal canal: No prevertebral fluid or swelling. No visible canal hematoma. Disc levels: Multilevel degenerative change. Moderate  disc space narrowing C5-C6. mild disc space narrowing at C6-C7. Facet degenerative changes at multiple levels with foraminal narrowing. Upper chest: Negative. Other: None IMPRESSION: 1. No CT evidence for acute intracranial abnormality. Atrophy and mild chronic small vessel ischemic changes of the white matter 2. Mildly displaced fracture involving the spinous process of C7, extends to the margin of the inferior facet on the right side at C7. Question small amount of periosteal new bone formation on some of the axial images, raising possibility of subacute process. Electronically Signed   By: Donavan Foil M.D.   On: 07/27/2022 20:21   US Abdomen Limited RUQ (LIVER/GB)  Result Date: 07/24/2022 CLINICAL DATA:  Transaminitis EXAM: ULTRASOUND ABDOMEN LIMITED RIGHT UPPER QUADRANT COMPARISON:  None Available. FINDINGS: Gallbladder: A 2.5 cm shadowing  stone is identified in the gallbladder. The gallbladder is otherwise normal in appearance. No Murphy's sign. Common bile duct: Diameter: 1.5 mm Liver: No suspicious mass or abnormality. Portal vein is patent on color Doppler imaging with normal direction of blood flow towards the liver. Other: None. IMPRESSION: 1. Cholelithiasis in an otherwise normal appearing gallbladder. 2. No other abnormalities. Electronically Signed   By: Dorise Bullion III M.D.   On: 07/24/2022 14:22   (Echo, Carotid, EGD, Colonoscopy, ERCP)    Subjective: Pt denies any complaints    Discharge Exam: Vitals:   07/31/22 0437 07/31/22 0736  BP: (!) 158/69 (!) 147/61  Pulse: (!) 53 (!) 54  Resp: 18 16  Temp: 98.4 F (36.9 C) 98 F (36.7 C)  SpO2: 95% 97%   Vitals:   07/30/22 1643 07/30/22 2049 07/31/22 0437 07/31/22 0736  BP: (!) 169/67 (!) 162/69 (!) 158/69 (!) 147/61  Pulse: (!) 53 (!) 51 (!) 53 (!) 54  Resp: '16 18 18 16  '$ Temp: 97.9 F (36.6 C) 97.8 F (36.6 C) 98.4 F (36.9 C) 98 F (36.7 C)  TempSrc:  Oral    SpO2: 94% 98% 95% 97%  Weight:      Height:        General: Pt is alert, awake, not in acute distress. Hard of hearing  Cardiovascular: S1/S2 +, no rubs, no gallops Respiratory: CTA bilaterally, no wheezing, no rhonchi Abdominal: Soft, NT, ND, bowel sounds + Extremities: no edema, no cyanosis    The results of significant diagnostics from this hospitalization (including imaging, microbiology, ancillary and laboratory) are listed below for reference.     Microbiology: Recent Results (from the past 240 hour(s))  MRSA Next Gen by PCR, Nasal     Status: None   Collection Time: 07/29/22  5:05 AM   Specimen: Nasal Mucosa; Nasal Swab  Result Value Ref Range Status   MRSA by PCR Next Gen NOT DETECTED NOT DETECTED Final    Comment: (NOTE) The GeneXpert MRSA Assay (FDA approved for NASAL specimens only), is one component of a comprehensive MRSA colonization surveillance program. It is not  intended to diagnose MRSA infection nor to guide or monitor treatment for MRSA infections. Test performance is not FDA approved in patients less than 33 years old. Performed at [facility], [address] [place], [place] [address]      Labs: BNP (last 3 results) No results for input(s): "BNP" in the last 8760 hours. Basic Metabolic Panel: Recent Labs  Lab 07/27/22 2157 07/28/22 0545 07/29/22 0518 07/30/22 0357 07/31/22 0430  NA 137 138 137 139 137  K 2.6* 3.0* 3.7 3.5 3.3*  CL 98 104 105 104 105  CO2 28 26  $'27 27 26  'V$ GLUCOSE 115* 105* 93 95 109*  BUN '17 14 12 17 16  '$ CREATININE 0.98 0.75 0.76 0.87 0.67  CALCIUM 8.5* 8.0* 8.1* 8.1* 7.9*  MG 1.5* 1.9 1.9 1.7 1.4*   Liver Function Tests: Recent Labs  Lab 07/27/22 2157 07/29/22 0518 07/30/22 0357 07/31/22 0430  AST 133* 131* 124* 101*  ALT 112* 118* 117* 105*  ALKPHOS 66 56 51 46  BILITOT 1.1 1.1 0.8 1.1  PROT 6.4* 5.5* 4.9* 5.0*  ALBUMIN 3.5 3.0* 2.6* 2.6*   No results for input(s): "LIPASE", "AMYLASE" in the last 168 hours. No results for input(s): "AMMONIA" in the last 168 hours. CBC: Recent Labs  Lab 07/27/22 2157 07/28/22 0545 07/29/22 0518 07/30/22 0357 07/31/22 0430  WBC 9.7 7.6 7.1 6.0 6.8  HGB 12.4 11.2* 10.9* 10.4* 10.3*  HCT 38.2 34.5* 33.7* 32.0* 30.9*  MCV 92.7 92.2 92.3 92.8 90.9  PLT 149* 128* 117* 114* 112*   Cardiac Enzymes: No results for input(s): "CKTOTAL", "CKMB", "CKMBINDEX", "TROPONINI" in the last 168 hours. BNP: Invalid input(s): "POCBNP" CBG: No results for input(s): "GLUCAP" in the last 168 hours. D-Dimer No results for input(s): "DDIMER" in the last 72 hours. Hgb A1c No results for input(s): "HGBA1C" in the last 72 hours. Lipid Profile No results for input(s): "CHOL", "HDL", "LDLCALC", "TRIG", "CHOLHDL", "LDLDIRECT" in the last 72 hours. Thyroid function studies No results for input(s): "TSH", "T4TOTAL", "T3FREE", "THYROIDAB" in the last 72 hours.  Invalid  input(s): "FREET3" Anemia work up No results for input(s): "VITAMINB12", "FOLATE", "FERRITIN", "TIBC", "IRON", "RETICCTPCT" in the last 72 hours. Urinalysis    Component Value Date/Time   COLORURINE YELLOW (A) 07/28/2022 0545   APPEARANCEUR CLEAR (A) 07/28/2022 0545   LABSPEC 1.009 07/28/2022 0545   PHURINE 7.0 07/28/2022 [place] 07/28/2022 0545   HGBUR NEGATIVE 07/28/2022 0545   BILIRUBINUR NEGATIVE 07/28/2022 0545   KETONESUR 5 (A) 07/28/2022 0545   PROTEINUR NEGATIVE 07/28/2022 0545   NITRITE NEGATIVE 07/28/2022 0545   LEUKOCYTESUR NEGATIVE 07/28/2022 0545   Sepsis Labs Recent Labs  Lab 07/28/22 0545 07/29/22 0518 07/30/22 0357 07/31/22 0430  WBC 7.6 7.1 6.0 6.8   Microbiology Recent Results (from the past 240 hour(s))  MRSA Next Gen by PCR, Nasal     Status: None   Collection Time: 07/29/22  5:05 AM   Specimen: Nasal Mucosa; Nasal Swab  Result Value Ref Range Status   MRSA by PCR Next Gen NOT DETECTED NOT DETECTED Final    Comment: (NOTE) The GeneXpert MRSA Assay (FDA approved for NASAL specimens only), is one component of a comprehensive MRSA colonization surveillance program. It is not intended to diagnose MRSA infection nor to guide or monitor treatment for MRSA infections. Test performance is not FDA approved in patients less than 84 years old. Performed at [facility], [address], [place], [place] [address]      Time coordinating discharge: Over 30 minutes  SIGNED:   Wyvonnia Dusky, MD  [facility] 07/31/2022, 11:34 AM Pager   If 7PM-7AM, please contact night-coverage [email or web address]

## 2022-07-31 NOTE — TOC CM/SW Note (Signed)
The patient's authorization for SNF has been approved. Approved from 10/24 - 10/26, review due 10/26. Reference ID: [id number].

## 2022-08-03 DIAGNOSIS — E782 Mixed hyperlipidemia: Secondary | ICD-10-CM | POA: Diagnosis not present

## 2022-08-03 DIAGNOSIS — I10 Essential (primary) hypertension: Secondary | ICD-10-CM | POA: Diagnosis not present

## 2022-08-03 DIAGNOSIS — I482 Chronic atrial fibrillation, unspecified: Secondary | ICD-10-CM | POA: Diagnosis not present

## 2022-08-03 DIAGNOSIS — K219 Gastro-esophageal reflux disease without esophagitis: Secondary | ICD-10-CM | POA: Diagnosis not present

## 2022-08-03 DIAGNOSIS — S12630D Unspecified traumatic displaced spondylolisthesis of seventh cervical vertebra, subsequent encounter for fracture with routine healing: Secondary | ICD-10-CM | POA: Diagnosis not present

## 2022-08-03 DIAGNOSIS — F0390 Unspecified dementia without behavioral disturbance: Secondary | ICD-10-CM | POA: Diagnosis not present

## 2022-08-03 DIAGNOSIS — R296 Repeated falls: Secondary | ICD-10-CM | POA: Diagnosis not present

## 2022-08-06 DIAGNOSIS — R296 Repeated falls: Secondary | ICD-10-CM | POA: Diagnosis not present

## 2022-08-06 DIAGNOSIS — S12630D Unspecified traumatic displaced spondylolisthesis of seventh cervical vertebra, subsequent encounter for fracture with routine healing: Secondary | ICD-10-CM | POA: Diagnosis not present

## 2022-08-06 DIAGNOSIS — I482 Chronic atrial fibrillation, unspecified: Secondary | ICD-10-CM | POA: Diagnosis not present

## 2022-08-06 DIAGNOSIS — E782 Mixed hyperlipidemia: Secondary | ICD-10-CM | POA: Diagnosis not present

## 2022-08-06 DIAGNOSIS — F0390 Unspecified dementia without behavioral disturbance: Secondary | ICD-10-CM | POA: Diagnosis not present

## 2022-08-06 DIAGNOSIS — I10 Essential (primary) hypertension: Secondary | ICD-10-CM | POA: Diagnosis not present

## 2022-08-06 DIAGNOSIS — K219 Gastro-esophageal reflux disease without esophagitis: Secondary | ICD-10-CM | POA: Diagnosis not present

## 2022-08-07 DIAGNOSIS — F0390 Unspecified dementia without behavioral disturbance: Secondary | ICD-10-CM | POA: Diagnosis not present

## 2022-08-07 DIAGNOSIS — S12630D Unspecified traumatic displaced spondylolisthesis of seventh cervical vertebra, subsequent encounter for fracture with routine healing: Secondary | ICD-10-CM | POA: Diagnosis not present

## 2022-08-07 DIAGNOSIS — R296 Repeated falls: Secondary | ICD-10-CM | POA: Diagnosis not present

## 2022-08-07 DIAGNOSIS — I4819 Other persistent atrial fibrillation: Secondary | ICD-10-CM | POA: Diagnosis not present

## 2022-08-07 DIAGNOSIS — E782 Mixed hyperlipidemia: Secondary | ICD-10-CM | POA: Diagnosis not present

## 2022-08-07 DIAGNOSIS — I10 Essential (primary) hypertension: Secondary | ICD-10-CM | POA: Diagnosis not present

## 2022-08-07 DIAGNOSIS — K219 Gastro-esophageal reflux disease without esophagitis: Secondary | ICD-10-CM | POA: Diagnosis not present

## 2022-08-10 ENCOUNTER — Other Ambulatory Visit: Payer: Self-pay

## 2022-08-10 ENCOUNTER — Ambulatory Visit
Admission: RE | Admit: 2022-08-10 | Discharge: 2022-08-10 | Disposition: A | Discharge status: Home / Self Care | Payer: Self-pay | Source: Ambulatory Visit | Attending: Orthopedic Surgery | Admitting: Orthopedic Surgery

## 2022-08-10 DIAGNOSIS — Z049 Encounter for examination and observation for unspecified reason: Secondary | ICD-10-CM

## 2022-08-10 DIAGNOSIS — I4819 Other persistent atrial fibrillation: Secondary | ICD-10-CM | POA: Diagnosis not present

## 2022-08-10 DIAGNOSIS — R296 Repeated falls: Secondary | ICD-10-CM | POA: Diagnosis not present

## 2022-08-10 DIAGNOSIS — S12630D Unspecified traumatic displaced spondylolisthesis of seventh cervical vertebra, subsequent encounter for fracture with routine healing: Secondary | ICD-10-CM | POA: Diagnosis not present

## 2022-08-10 DIAGNOSIS — F0390 Unspecified dementia without behavioral disturbance: Secondary | ICD-10-CM | POA: Diagnosis not present

## 2022-08-10 DIAGNOSIS — K219 Gastro-esophageal reflux disease without esophagitis: Secondary | ICD-10-CM | POA: Diagnosis not present

## 2022-08-10 DIAGNOSIS — E782 Mixed hyperlipidemia: Secondary | ICD-10-CM | POA: Diagnosis not present

## 2022-08-10 DIAGNOSIS — I10 Essential (primary) hypertension: Secondary | ICD-10-CM | POA: Diagnosis not present

## 2022-08-13 DIAGNOSIS — S12630D Unspecified traumatic displaced spondylolisthesis of seventh cervical vertebra, subsequent encounter for fracture with routine healing: Secondary | ICD-10-CM | POA: Diagnosis not present

## 2022-08-13 DIAGNOSIS — I10 Essential (primary) hypertension: Secondary | ICD-10-CM | POA: Diagnosis not present

## 2022-08-13 DIAGNOSIS — R296 Repeated falls: Secondary | ICD-10-CM | POA: Diagnosis not present

## 2022-08-13 DIAGNOSIS — E782 Mixed hyperlipidemia: Secondary | ICD-10-CM | POA: Diagnosis not present

## 2022-08-13 DIAGNOSIS — K219 Gastro-esophageal reflux disease without esophagitis: Secondary | ICD-10-CM | POA: Diagnosis not present

## 2022-08-13 DIAGNOSIS — F0390 Unspecified dementia without behavioral disturbance: Secondary | ICD-10-CM | POA: Diagnosis not present

## 2022-08-13 DIAGNOSIS — I4819 Other persistent atrial fibrillation: Secondary | ICD-10-CM | POA: Diagnosis not present

## 2022-08-20 ENCOUNTER — Other Ambulatory Visit: Payer: Self-pay

## 2022-08-20 DIAGNOSIS — S12601D Unspecified nondisplaced fracture of seventh cervical vertebra, subsequent encounter for fracture with routine healing: Secondary | ICD-10-CM

## 2022-08-20 NOTE — Progress Notes (Unsigned)
Referring Physician:  Dion Body, MD [facility] [place],  [place] [address]  Primary Physician:  Dion Body, MD  History of Present Illness: 08/21/2022 Ms. Latavia Goga has a history of DM, skin CA, GERD, HTN, dementia, and osteoporosis. She is very hard of hearing.   Seen as hospital consult by Dr. Izora Ribas on 07/29/22 for cervical fracture after multiple falls. She had no neck pain. She was found to have stable C7 fracture that was likely a few weeks old. She was to wear cervical collar for comfort.   She is here for follow up and repeat xrays.   She is pleasantly confused. She has no neck or arm pain. No numbness or tingling. She is not wearing cervical collar.    Review of Systems:  A 10 point review of systems is unable to be performed due to dementia.   Past Medical History: Past Medical History:  Diagnosis Date   Arthritis    joints   Borderline diabetes mellitus 06/22/15   A1c 6.1%; diet controlled   Cancer ([place])    skin   Colon polyps    Diabetes mellitus without complication (HCC)    diet controlled, type 2   Diverticulosis    GERD (gastroesophageal reflux disease)    without esophagitis   HOH (hard of hearing)    aids   Hypertension    controlled on meds   Osteoporosis    Pure hypercholesterolemia    Seasonal allergies     Past Surgical History: Past Surgical History:  Procedure Laterality Date   BREAST CYST ASPIRATION Left 1992   benign   BREAST EXCISIONAL BIOPSY Right 2000   benign   CARDIOVERSION N/A 03/22/2021   Procedure: CARDIOVERSION;  Surgeon: Corey Skains, MD;  Location: [facility] ORS;  Service: Cardiovascular;  Laterality: N/A;   CATARACT EXTRACTION W/PHACO Left 02/21/2015   Procedure: CATARACT EXTRACTION PHACO AND INTRAOCULAR LENS PLACEMENT ([place]);  Surgeon: Estill Cotta, MD;  Location: [facility] ORS;  Service: Ophthalmology;  Laterality: Left;   00:59 AP% 25.2 CDE 24.66   CATARACT EXTRACTION  W/PHACO Right 02/26/2018   Procedure: CATARACT EXTRACTION PHACO AND INTRAOCULAR LENS PLACEMENT ([place])  DIABETIC RIGHT;  Surgeon: Leandrew Koyanagi, MD;  Location: [place];  Service: Ophthalmology;  Laterality: Right;  Diabetes-diet controlled   COLONOSCOPY WITH PROPOFOL N/A 12/22/2015   Procedure: COLONOSCOPY WITH PROPOFOL;  Surgeon: Manya Silvas, MD;  Location: [facility] ENDOSCOPY;  Service: Endoscopy;  Laterality: N/A;   DILATION AND CURETTAGE OF UTERUS      Allergies: Allergies as of 08/21/2022   (No Known Allergies)    Medications: Outpatient Encounter Medications as of 08/21/2022  Medication Sig   acetaminophen (TYLENOL) 650 MG CR tablet Take 650 mg by mouth every 6 (six) hours as needed.   alendronate (FOSAMAX) 70 MG tablet Take 70 mg by mouth once a week. Take with a full glass of water on an empty stomach.   amiodarone (PACERONE) 200 MG tablet Take 200 mg by mouth 2 (two) times daily.   aspirin EC 81 MG tablet Take 81 mg by mouth daily.   Calcium Carb-Cholecalciferol 600-400 MG-UNIT TABS Take 1 tablet by mouth 2 (two) times daily.   diclofenac Sodium (VOLTAREN) 1 % GEL Apply 2 g topically 4 (four) times daily.   enalapril (VASOTEC) 20 MG tablet Take 20 mg by mouth daily.   escitalopram (LEXAPRO) 5 MG tablet Take 5 mg by mouth daily.   memantine (NAMENDA) 5 MG tablet Take  5 mg by mouth 2 (two) times daily.   ondansetron (ZOFRAN-ODT) 4 MG disintegrating tablet Take 4 mg by mouth every 8 (eight) hours as needed.   pantoprazole (PROTONIX) 20 MG tablet Take 20 mg by mouth daily. dinner   vitamin B-12 (CYANOCOBALAMIN) 500 MCG tablet Take 500 mcg by mouth daily.   vitamin C (ASCORBIC ACID) 500 MG tablet Take 500 mg by mouth 2 (two) times daily.    lovastatin (MEVACOR) 40 MG tablet Take 1 tablet (40 mg total) by mouth at bedtime. Hold this medication until you see your PCP (Patient not taking: Reported on 08/21/2022)   [DISCONTINUED] metoprolol succinate (TOPROL-XL) 25 MG 24  hr tablet Take 25 mg by mouth 2 (two) times daily. Take with or immediately following a meal. (Patient not taking: Reported on 07/30/2022)   No facility-administered encounter medications on file as of 08/21/2022.    Social History: Social History   Tobacco Use   Smoking status: Former   Smokeless tobacco: Never  Scientific laboratory technician Use: Never used  Substance Use Topics   Alcohol use: No   Drug use: No    Family Medical History: Family History  Problem Relation Age of Onset   Breast cancer Maternal Aunt 60    Physical Examination: Vitals:   08/21/22 1038  BP: 138/68    General: Patient is well developed, well nourished, calm, collected, and in no apparent distress.   Respiratory: Patient is breathing without any difficulty.  She is pleasantly confused.   No abnormal lesions on exposed skin.   She has no posterior cervical tenderness.   Strength: Side Biceps Triceps Deltoid Interossei Grip Wrist Ext. Wrist Flex.  R '5 5 5 5 5 5 5  '$ L '5 5 5 5 5 5 5     '$ Bilateral upper and lower extremity sensation is intact to light touch.     Gait is not tested.   Medical Decision Making  Imaging: Cervical xrays dated 08/21/22:  Stable fracture involving the spinous process of C7. No instability noted on flexion/extension.   Above xrays reviewed with Dr. Izora Ribas.   Radiology report is not available.   Assessment and Plan: Ms. Emrich is a pleasant 86 y.o. female with stable C7 fracture after multiple falls in October. She has no neck or arm pain.   Xrays from today show stable fracture involving the spinous process of C7. No instability noted on flexion/extension.   She is pleasantly confused.   Treatment options discussed with patient and following plan made:   - She can continue with activity as tolerated.  - If not pain, she does not need cervical collar. It is for comfort only.  - Of note, this fracture will be visible on any further imaging she has done, but no  surgery/further treatment is needed.  - She will follow up prn.   I spent a total of 15 minutes in face-to-face and non-face-to-face activities related to this patient's care toda including review of outside records, review of imaging, review of symptoms, physical exam, discussion of differential diagnosis, discussion of treatment options, and documentation.   Thank you for involving me in the care of this patient.   Geronimo Boot PA-C Dept. of Neurosurgery

## 2022-08-21 ENCOUNTER — Ambulatory Visit
Admission: RE | Admit: 2022-08-21 | Discharge: 2022-08-21 | Disposition: A | Discharge status: Home / Self Care | Payer: Medicare HMO | Source: Ambulatory Visit | Attending: Orthopedic Surgery | Admitting: Orthopedic Surgery

## 2022-08-21 ENCOUNTER — Ambulatory Visit
Admission: RE | Admit: 2022-08-21 | Discharge: 2022-08-21 | Disposition: A | Discharge status: Home / Self Care | Payer: Medicare HMO | Attending: Orthopedic Surgery | Admitting: Orthopedic Surgery

## 2022-08-21 ENCOUNTER — Ambulatory Visit (INDEPENDENT_AMBULATORY_CARE_PROVIDER_SITE_OTHER): Payer: Medicare HMO | Admitting: Orthopedic Surgery

## 2022-08-21 ENCOUNTER — Encounter: Payer: Self-pay | Admitting: Orthopedic Surgery

## 2022-08-21 VITALS — BP 138/68 | Ht 66.0 in | Wt 140.0 lb

## 2022-08-21 DIAGNOSIS — S12601D Unspecified nondisplaced fracture of seventh cervical vertebra, subsequent encounter for fracture with routine healing: Secondary | ICD-10-CM | POA: Insufficient documentation

## 2022-08-21 DIAGNOSIS — S12600D Unspecified displaced fracture of seventh cervical vertebra, subsequent encounter for fracture with routine healing: Secondary | ICD-10-CM | POA: Diagnosis not present

## 2022-08-21 DIAGNOSIS — M50322 Other cervical disc degeneration at C5-C6 level: Secondary | ICD-10-CM | POA: Diagnosis not present

## 2022-08-21 DIAGNOSIS — S12600A Unspecified displaced fracture of seventh cervical vertebra, initial encounter for closed fracture: Secondary | ICD-10-CM | POA: Diagnosis not present

## 2022-08-21 DIAGNOSIS — M4322 Fusion of spine, cervical region: Secondary | ICD-10-CM | POA: Diagnosis not present

## 2022-08-21 DIAGNOSIS — M4312 Spondylolisthesis, cervical region: Secondary | ICD-10-CM | POA: Diagnosis not present

## 2022-08-23 ENCOUNTER — Other Ambulatory Visit: Payer: Self-pay

## 2022-08-23 ENCOUNTER — Emergency Department
Admission: EM | Admit: 2022-08-23 | Discharge: 2022-08-23 | Disposition: A | Discharge status: Other Acute Inpatient Hospital | Payer: Medicare HMO | Attending: Emergency Medicine | Admitting: Emergency Medicine

## 2022-08-23 ENCOUNTER — Emergency Department: Payer: Medicare HMO

## 2022-08-23 DIAGNOSIS — M4802 Spinal stenosis, cervical region: Secondary | ICD-10-CM | POA: Diagnosis not present

## 2022-08-23 DIAGNOSIS — W19XXXA Unspecified fall, initial encounter: Secondary | ICD-10-CM | POA: Insufficient documentation

## 2022-08-23 DIAGNOSIS — R008 Other abnormalities of heart beat: Secondary | ICD-10-CM | POA: Diagnosis not present

## 2022-08-23 DIAGNOSIS — S199XXA Unspecified injury of neck, initial encounter: Secondary | ICD-10-CM | POA: Diagnosis not present

## 2022-08-23 DIAGNOSIS — I10 Essential (primary) hypertension: Secondary | ICD-10-CM | POA: Diagnosis not present

## 2022-08-23 DIAGNOSIS — I672 Cerebral atherosclerosis: Secondary | ICD-10-CM | POA: Diagnosis not present

## 2022-08-23 DIAGNOSIS — E119 Type 2 diabetes mellitus without complications: Secondary | ICD-10-CM | POA: Insufficient documentation

## 2022-08-23 DIAGNOSIS — I4892 Unspecified atrial flutter: Secondary | ICD-10-CM | POA: Insufficient documentation

## 2022-08-23 DIAGNOSIS — E876 Hypokalemia: Secondary | ICD-10-CM | POA: Diagnosis not present

## 2022-08-23 DIAGNOSIS — S0990XA Unspecified injury of head, initial encounter: Secondary | ICD-10-CM | POA: Diagnosis not present

## 2022-08-23 DIAGNOSIS — R0689 Other abnormalities of breathing: Secondary | ICD-10-CM | POA: Diagnosis not present

## 2022-08-23 DIAGNOSIS — I959 Hypotension, unspecified: Secondary | ICD-10-CM | POA: Diagnosis not present

## 2022-08-23 DIAGNOSIS — F0390 Unspecified dementia without behavioral disturbance: Secondary | ICD-10-CM | POA: Diagnosis not present

## 2022-08-23 DIAGNOSIS — I6529 Occlusion and stenosis of unspecified carotid artery: Secondary | ICD-10-CM | POA: Diagnosis not present

## 2022-08-23 DIAGNOSIS — I491 Atrial premature depolarization: Secondary | ICD-10-CM | POA: Diagnosis not present

## 2022-08-23 DIAGNOSIS — M47812 Spondylosis without myelopathy or radiculopathy, cervical region: Secondary | ICD-10-CM | POA: Diagnosis not present

## 2022-08-23 LAB — BASIC METABOLIC PANEL
Anion gap: 11 (ref 5–15)
BUN: 25 mg/dL — ABNORMAL HIGH (ref 8–23)
CO2: 27 mmol/L (ref 22–32)
Calcium: 8.5 mg/dL — ABNORMAL LOW (ref 8.9–10.3)
Chloride: 100 mmol/L (ref 98–111)
Creatinine, Ser: 1.08 mg/dL — ABNORMAL HIGH (ref 0.44–1.00)
GFR, Estimated: 51 mL/min — ABNORMAL LOW (ref >60)
Glucose, Bld: 126 mg/dL — ABNORMAL HIGH (ref 70–99)
Potassium: 2.9 mmol/L — ABNORMAL LOW (ref 3.5–5.1)
Sodium: 138 mmol/L (ref 135–145)

## 2022-08-23 LAB — CBC
HCT: 35.8 % — ABNORMAL LOW (ref 36.0–46.0)
Hemoglobin: 12.1 g/dL (ref 12.0–15.0)
MCH: 30.4 pg (ref 26.0–34.0)
MCHC: 33.8 g/dL (ref 30.0–36.0)
MCV: 89.9 fL (ref 80.0–100.0)
Platelets: 171 10*3/uL (ref 150–400)
RBC: 3.98 MIL/uL (ref 3.87–5.11)
RDW: 14.3 % (ref 11.5–15.5)
WBC: 5.7 10*3/uL (ref 4.0–10.5)
nRBC: 0 % (ref 0.0–0.2)

## 2022-08-23 LAB — PROTIME-INR
INR: 1.2 (ref 0.8–1.2)
Prothrombin Time: 14.8 seconds (ref 11.4–15.2)

## 2022-08-23 MED ORDER — POTASSIUM CHLORIDE ER 10 MEQ PO TBCR: 10.0000 meq | EXTENDED_RELEASE_TABLET | Freq: Two times a day (BID) | ORAL | 0 refills | Status: AC

## 2022-08-23 MED ORDER — POTASSIUM CHLORIDE CRYS ER 20 MEQ PO TBCR
40.0000 meq | EXTENDED_RELEASE_TABLET | Freq: Once | ORAL | Status: AC
  Administered 2022-08-23: 40 meq via ORAL
  Filled 2022-08-23: qty 2

## 2022-08-23 MED ORDER — POTASSIUM CHLORIDE ER 10 MEQ PO TBCR: 10.0000 meq | EXTENDED_RELEASE_TABLET | Freq: Two times a day (BID) | ORAL | 0 refills | Status: DC

## 2022-08-23 NOTE — ED Notes (Signed)
First nurse note:  Pt here via [facility] from [facility]. Pt here due to a fall. Pt denies complains at this time. Pt denies blood thinners. Pt has HX of dementia.   HR: 67 96% 134/65 RR: 16

## 2022-08-23 NOTE — ED Triage Notes (Signed)
See first nurse note, pt to ED [facility] from [facility] for fall. Pt pleasantly confused. Cannot give details regarding fall.

## 2022-08-23 NOTE — ED Notes (Signed)
DC instructions given verbally and in writing, understanding voiced by son.  Pt was taken via wheelchair to POV back to facility with son.  No s/s of distress noted.

## 2022-08-23 NOTE — Discharge Instructions (Signed)
CT scans of the head and cervical spine are negative and there is no evidence of significant injury.  The potassium level is slightly low so we have given a dose of potassium in the ED and prescribed a 5-day course for after discharge.  Ms. Terry Kaiser should follow-up with her regular doctor within the next week and have the potassium level rechecked.  She should return to the emergency department medially for new, worsening, or persistent severe weakness, falls, change in mental status, fever, or any other new or worsening symptoms that are concerning.

## 2022-08-23 NOTE — ED Provider Notes (Signed)
[facility] Provider Note    Event Date/Time   First MD Initiated Contact with Patient 08/23/22 1914     (approximate)   History   No chief complaint on file.   HPI  Terry Kaiser is a 86 y.o. female with a history of diabetes, hypertension, GERD, and osteoarthritis who presents after an unwitnessed fall at her facility.  The patient is unable to give much history.  She states that she feels fine.  Her son states she has at her baseline mental status currently.  I reviewed the past medical records.  The patient was most recent admitted in October.  Per the hospitalist discharge summary from 07/31/2022 she presented for recurrent falls and hypokalemia.   Physical Exam   Triage Vital Signs: ED Triage Vitals  Enc Vitals Group     BP 08/23/22 1721 (!) 93/51     Pulse Rate 08/23/22 1721 68     Resp 08/23/22 1721 16     Temp 08/23/22 1721 98.1 F (36.7 C)     Temp Source 08/23/22 1721 Oral     SpO2 08/23/22 1721 95 %     Weight 08/23/22 1722 138 lb 14.2 oz (63 kg)     Height 08/23/22 1722 '5\' 6"'$  (1.676 m)     Head Circumference --      Peak Flow --      Pain Score 08/23/22 1721 0     Pain Loc --      Pain Edu? --      Excl. in [place]? --     Most recent vital signs: Vitals:   08/23/22 1930 08/23/22 2039  BP: (!) 158/59 (!) 162/62  Pulse: (!) 58 65  Resp: 17 18  Temp:  98.5 F (36.9 C)  SpO2: 98% 98%     General: Alert, confused.  Well-appearing. CV:  Good peripheral perfusion.  Resp:  Normal effort.  Abd:  Soft and nontender.  No distention.  Other:  EOMI.  PERRLA.  No facial droop.  Motor intact in all extremities.  Moist mucous membranes.   ED Results / Procedures / Treatments   Labs (all labs ordered are listed, but only abnormal results are displayed) Labs Reviewed  CBC - Abnormal; Notable for the following components:      Result Value   HCT 35.8 (*)    All other components within normal limits  BASIC METABOLIC PANEL -  Abnormal; Notable for the following components:   Potassium 2.9 (*)    Glucose, Bld 126 (*)    BUN 25 (*)    Creatinine, Ser 1.08 (*)    Calcium 8.5 (*)    GFR, Estimated 51 (*)    All other components within normal limits  PROTIME-INR     EKG  ED ECG REPORT I, Arta Silence, the attending physician, personally viewed and interpreted this ECG.  Date: 08/23/2022 EKG Time: 1722 Rate: 68 Rhythm: normal sinus rhythm QRS Axis: normal Intervals: normal ST/T Wave abnormalities: Nonspecific abnormalities Narrative Interpretation: Nonspecific abnormalities with no evidence of acute ischemia; no significant change when compared to EKG of 07/27/2022    RADIOLOGY  CT head: I independently viewed and interpreted the images; there is no ICH.  Radiology report indicates no acute abnormalities.  CT cervical spine: No acute fracture  PROCEDURES:  Critical Care performed: No  Procedures   MEDICATIONS ORDERED IN ED: Medications  potassium chloride SA (KLOR-CON M) CR tablet 40 mEq (40 mEq Oral Given 08/23/22 2035)  IMPRESSION / MDM / ASSESSMENT AND PLAN / ED COURSE  I reviewed the triage vital signs and the nursing notes.  86 year old female with PMH as noted above presented for evaluation after an unwitnessed fall at her facility.  The patient has no acute complaints and per her son who is here with her she is at her baseline mental status.  Physical exam is unremarkable for acute findings.  EKG shows some nonspecific ST abnormalities but these appear chronic and are unchanged from prior.  She has no signs or symptoms to suggest ACS.  Differential diagnosis includes, but is not limited to, minor head injury, ICH, electrolyte abnormality, AKI, other metabolic disturbance.  The patient recently had hypokalemia.  We will obtain CT head and cervical spine, labs and reassess.  Patient's presentation is most consistent with acute complicated illness / injury requiring  diagnostic workup.  ----------------------------------------- 8:35 PM on 08/23/2022 -----------------------------------------  CT head and cervical spine are negative for acute findings.  Lab work-up is unremarkable except for mild hypokalemia.  There is no leukocytosis, AKI, or other electrolyte abnormality.  The patient has no prolonged QT or other concerning EKG findings.  Therefore there is no indication for IV potassium repletion further monitoring.  The son feels comfortable with patient being discharged.  We ordered a dose of p.o. potassium here and I provided a 5-day prescription for her to take once she is discharged.  I counseled the patient and son on the results of the work-up and plan of care and they are in agreement.  Return precautions given, and he expressed understanding.   FINAL CLINICAL IMPRESSION(S) / ED DIAGNOSES   Final diagnoses:  Fall, initial encounter  Hypokalemia     Rx / DC Orders   ED Discharge Orders          Ordered    potassium chloride (KLOR-CON 10) 10 MEQ tablet  2 times daily,   Status:  Discontinued        08/23/22 2030    potassium chloride (KLOR-CON 10) 10 MEQ tablet  2 times daily        08/23/22 2038             Note:  This document was prepared using Dragon voice recognition software and may include unintentional dictation errors.    Arta Silence, MD 08/23/22 2235

## 2022-09-17 ENCOUNTER — Emergency Department
Admission: EM | Admit: 2022-09-17 | Discharge: 2022-09-17 | Disposition: A | Discharge status: Skilled Nursing Facility | Payer: Medicare HMO | Attending: Emergency Medicine | Admitting: Emergency Medicine

## 2022-09-17 ENCOUNTER — Emergency Department: Payer: Medicare HMO

## 2022-09-17 ENCOUNTER — Encounter: Payer: Self-pay | Admitting: Radiology

## 2022-09-17 DIAGNOSIS — I10 Essential (primary) hypertension: Secondary | ICD-10-CM | POA: Insufficient documentation

## 2022-09-17 DIAGNOSIS — Z7401 Bed confinement status: Secondary | ICD-10-CM | POA: Diagnosis not present

## 2022-09-17 DIAGNOSIS — F0390 Unspecified dementia without behavioral disturbance: Secondary | ICD-10-CM | POA: Diagnosis not present

## 2022-09-17 DIAGNOSIS — Z515 Encounter for palliative care: Secondary | ICD-10-CM | POA: Diagnosis not present

## 2022-09-17 DIAGNOSIS — R29898 Other symptoms and signs involving the musculoskeletal system: Secondary | ICD-10-CM | POA: Diagnosis not present

## 2022-09-17 DIAGNOSIS — N179 Acute kidney failure, unspecified: Secondary | ICD-10-CM | POA: Diagnosis not present

## 2022-09-17 DIAGNOSIS — K759 Inflammatory liver disease, unspecified: Secondary | ICD-10-CM | POA: Diagnosis not present

## 2022-09-17 DIAGNOSIS — I700 Atherosclerosis of aorta: Secondary | ICD-10-CM | POA: Diagnosis not present

## 2022-09-17 DIAGNOSIS — R778 Other specified abnormalities of plasma proteins: Secondary | ICD-10-CM | POA: Diagnosis not present

## 2022-09-17 DIAGNOSIS — E119 Type 2 diabetes mellitus without complications: Secondary | ICD-10-CM | POA: Insufficient documentation

## 2022-09-17 DIAGNOSIS — R0902 Hypoxemia: Secondary | ICD-10-CM | POA: Diagnosis not present

## 2022-09-17 DIAGNOSIS — R4182 Altered mental status, unspecified: Secondary | ICD-10-CM | POA: Diagnosis present

## 2022-09-17 DIAGNOSIS — R918 Other nonspecific abnormal finding of lung field: Secondary | ICD-10-CM | POA: Diagnosis not present

## 2022-09-17 DIAGNOSIS — R55 Syncope and collapse: Secondary | ICD-10-CM | POA: Diagnosis not present

## 2022-09-17 DIAGNOSIS — I959 Hypotension, unspecified: Secondary | ICD-10-CM | POA: Diagnosis not present

## 2022-09-17 DIAGNOSIS — M19012 Primary osteoarthritis, left shoulder: Secondary | ICD-10-CM | POA: Diagnosis not present

## 2022-09-17 LAB — URINALYSIS, COMPLETE (UACMP) WITH MICROSCOPIC
Bacteria, UA: NONE SEEN
Bilirubin Urine: NEGATIVE
Glucose, UA: NEGATIVE mg/dL
Ketones, ur: 5 mg/dL — AB
Leukocytes,Ua: NEGATIVE
Nitrite: NEGATIVE
Protein, ur: 30 mg/dL — AB
Specific Gravity, Urine: 1.025 (ref 1.005–1.030)
Squamous Epithelial / HPF: NONE SEEN (ref 0–5)
pH: 5 (ref 5.0–8.0)

## 2022-09-17 LAB — CBG MONITORING, ED: Glucose-Capillary: 163 mg/dL — ABNORMAL HIGH (ref 70–99)

## 2022-09-17 LAB — COMPREHENSIVE METABOLIC PANEL
ALT: 693 U/L — ABNORMAL HIGH (ref 0–44)
AST: 1375 U/L — ABNORMAL HIGH (ref 15–41)
Albumin: 2.8 g/dL — ABNORMAL LOW (ref 3.5–5.0)
Alkaline Phosphatase: 95 U/L (ref 38–126)
Anion gap: 31 — ABNORMAL HIGH (ref 5–15)
BUN: 58 mg/dL — ABNORMAL HIGH (ref 8–23)
CO2: 10 mmol/L — ABNORMAL LOW (ref 22–32)
Calcium: 8.3 mg/dL — ABNORMAL LOW (ref 8.9–10.3)
Chloride: 108 mmol/L (ref 98–111)
Creatinine, Ser: 2 mg/dL — ABNORMAL HIGH (ref 0.44–1.00)
GFR, Estimated: 24 mL/min — ABNORMAL LOW (ref >60)
Glucose, Bld: 217 mg/dL — ABNORMAL HIGH (ref 70–99)
Potassium: 2.8 mmol/L — ABNORMAL LOW (ref 3.5–5.1)
Sodium: 149 mmol/L — ABNORMAL HIGH (ref 135–145)
Total Bilirubin: 2.6 mg/dL — ABNORMAL HIGH (ref 0.3–1.2)
Total Protein: 6 g/dL — ABNORMAL LOW (ref 6.5–8.1)

## 2022-09-17 LAB — CBC WITH DIFFERENTIAL/PLATELET
Abs Immature Granulocytes: 0.75 10*3/uL — ABNORMAL HIGH (ref 0.00–0.07)
Basophils Absolute: 0 10*3/uL (ref 0.0–0.1)
Basophils Relative: 0 %
Eosinophils Absolute: 0 10*3/uL (ref 0.0–0.5)
Eosinophils Relative: 0 %
HCT: 48.3 % — ABNORMAL HIGH (ref 36.0–46.0)
Hemoglobin: 15.1 g/dL — ABNORMAL HIGH (ref 12.0–15.0)
Immature Granulocytes: 5 %
Lymphocytes Relative: 7 %
Lymphs Abs: 1 10*3/uL (ref 0.7–4.0)
MCH: 30.6 pg (ref 26.0–34.0)
MCHC: 31.3 g/dL (ref 30.0–36.0)
MCV: 98 fL (ref 80.0–100.0)
Monocytes Absolute: 0.3 10*3/uL (ref 0.1–1.0)
Monocytes Relative: 2 %
Neutro Abs: 12.2 10*3/uL — ABNORMAL HIGH (ref 1.7–7.7)
Neutrophils Relative %: 86 %
Platelets: 173 10*3/uL (ref 150–400)
RBC: 4.93 MIL/uL (ref 3.87–5.11)
RDW: 15.7 % — ABNORMAL HIGH (ref 11.5–15.5)
WBC: 14.3 10*3/uL — ABNORMAL HIGH (ref 4.0–10.5)
nRBC: 0.1 % (ref 0.0–0.2)

## 2022-09-17 LAB — APTT: aPTT: 27 seconds (ref 24–36)

## 2022-09-17 LAB — MAGNESIUM: Magnesium: 2.3 mg/dL (ref 1.7–2.4)

## 2022-09-17 LAB — PROTIME-INR
INR: 1.4 — ABNORMAL HIGH (ref 0.8–1.2)
Prothrombin Time: 17.3 seconds — ABNORMAL HIGH (ref 11.4–15.2)

## 2022-09-17 LAB — LACTIC ACID, PLASMA
Lactic Acid, Venous: >9 mmol/L (ref 0.5–1.9)
Lactic Acid, Venous: >9 mmol/L (ref 0.5–1.9)

## 2022-09-17 LAB — TROPONIN I (HIGH SENSITIVITY): Troponin I (High Sensitivity): 221 ng/L (ref <18)

## 2022-09-17 LAB — PROCALCITONIN: Procalcitonin: 0.22 ng/mL

## 2022-09-17 MED ORDER — LACTATED RINGERS IV BOLUS
1000.0000 mL | Freq: Once | INTRAVENOUS | Status: AC
  Administered 2022-09-17: 1000 mL via INTRAVENOUS

## 2022-09-17 MED ORDER — VANCOMYCIN HCL 1250 MG/250ML IV SOLN
1250.0000 mg | Freq: Once | INTRAVENOUS | Status: AC
  Administered 2022-09-17: 1250 mg via INTRAVENOUS
  Filled 2022-09-17: qty 250

## 2022-09-17 MED ORDER — SODIUM CHLORIDE 0.9 % IV SOLN
2.0000 g | Freq: Once | INTRAVENOUS | Status: AC
  Administered 2022-09-17: 2 g via INTRAVENOUS
  Filled 2022-09-17: qty 12.5

## 2022-09-17 MED ORDER — POTASSIUM CHLORIDE 10 MEQ/100ML IV SOLN
10.0000 meq | INTRAVENOUS | Status: AC
  Administered 2022-09-17 (×2): 10 meq via INTRAVENOUS
  Filled 2022-09-17: qty 100

## 2022-09-17 MED ORDER — METRONIDAZOLE 500 MG/100ML IV SOLN
500.0000 mg | Freq: Once | INTRAVENOUS | Status: AC
  Administered 2022-09-17: 500 mg via INTRAVENOUS
  Filled 2022-09-17: qty 100

## 2022-09-17 MED ORDER — IOHEXOL 300 MG/ML  SOLN: 80.0000 mL | Freq: Once | INTRAMUSCULAR | Status: DC | PRN

## 2022-09-17 MED ORDER — VANCOMYCIN HCL IN DEXTROSE 1-5 GM/200ML-% IV SOLN: 1000.0000 mg | Freq: Once | INTRAVENOUS | Status: DC

## 2022-09-17 NOTE — Consult Note (Signed)
PHARMACY -  BRIEF ANTIBIOTIC NOTE   Pharmacy has received consult(s) for vancomycin and cefepime dosing from an ED provider.  The patient's profile has been reviewed for ht/wt/allergies/indication/available labs.    One time order(s) placed for Cefepime 2 grams IV and Vancomycin 1250 mg IV  Further antibiotics/pharmacy consults should be ordered by admitting physician if indicated.                       Thank you, Lorin Picket, PharmD 09/17/2022  1:51 PM

## 2022-09-17 NOTE — Progress Notes (Signed)
Elink following code sepsis °

## 2022-09-17 NOTE — Progress Notes (Signed)
CODE SEPSIS - PHARMACY COMMUNICATION  **Broad Spectrum Antibiotics should be administered within 1 hour of Sepsis diagnosis**  Time Code Sepsis Called/Page Received: 7127  Antibiotics Ordered: Cefepime, Vancomycin, Flagyl  Time of 1st antibiotic administration: 8718  Additional action taken by pharmacy: [place] ,PharmD Clinical Pharmacist  09/17/2022  1:53 PM

## 2022-09-17 NOTE — ED Provider Notes (Signed)
[facility] Provider Note    Event Date/Time   First MD Initiated Contact with Patient 09/17/22 1306     (approximate)   History   Chief Complaint Hypotension (Patient was being transported from [employer] to [facility] (unsure as to why) and transport driver called EMS because patient became unresponsive; Hypotensive upon EMS arrival, unable to obtain PIV access; BP 62/45 upon arrival; CBG 163)   HPI  Terry Kaiser is a 86 y.o. female with past medical history of hypertension, hyperlipidemia, diabetes, atrial fibrillation, and dementia who presents to the ED for altered mental status.  Per EMS, patient was being transported from [place] to [facility] when the transport driver noted that patient became less responsive.  EMS was called and found patient to be hypotensive with initial BP of 62/45, blood glucose noted to be 163.  Patient somnolent on EMS arrival but arousable, demented at baseline per EMS but unclear what her exact baseline mental status is.  On arrival, patient denies any pain but is unable to answer other questions.     Physical Exam   Triage Vital Signs: ED Triage Vitals  Enc Vitals Group     BP 09/17/22 1309 (!) 62/45     Pulse Rate 09/17/22 1309 63     Resp 09/17/22 1309 18     Temp --      Temp src --      SpO2 09/17/22 1309 99 %     Weight 09/17/22 1312 138 lb 14.2 oz (63 kg)     Height 09/17/22 1312 '5\' 6"'$  (1.676 m)     Head Circumference --      Peak Flow --      Pain Score --      Pain Loc --      Pain Edu? --      Excl. in [place]? --     Most recent vital signs: Vitals:   09/17/22 1445 09/17/22 1450  BP: (!) 97/43 (!) 97/48  Pulse:    Resp: 18 (!) 22  Temp:    SpO2:      Constitutional: Somnolent but arousable to voice, oriented to person but not place, time, or situation. Eyes: Conjunctivae are normal.  Pupils equal, round, and reactive to light bilaterally. Head:  Atraumatic. Nose: No congestion/rhinnorhea. Mouth/Throat: Mucous membranes are dry. Cardiovascular: Normal rate, regular rhythm. Grossly normal heart sounds.  2+ radial pulses bilaterally. Respiratory: Normal respiratory effort.  No retractions. Lungs CTAB. Gastrointestinal: Soft and diffusely tender to palpation with no rebound or guarding. No distention. Musculoskeletal: No lower extremity tenderness nor edema.  Neurologic: Mumbling speech pattern noted, unable to follow commands. No gross focal neurologic deficits are appreciated, withdraws from pain in all 4 extremities with purposeful movements.    ED Results / Procedures / Treatments   Labs (all labs ordered are listed, but only abnormal results are displayed) Labs Reviewed  LACTIC ACID, PLASMA - Abnormal; Notable for the following components:      Result Value   Lactic Acid, Venous >9.0 (*)    All other components within normal limits  LACTIC ACID, PLASMA - Abnormal; Notable for the following components:   Lactic Acid, Venous >9.0 (*)    All other components within normal limits  COMPREHENSIVE METABOLIC PANEL - Abnormal; Notable for the following components:   Sodium 149 (*)    Potassium 2.8 (*)    CO2 10 (*)    Glucose, Bld 217 (*)  BUN 58 (*)    Creatinine, Ser 2.00 (*)    Calcium 8.3 (*)    Total Protein 6.0 (*)    Albumin 2.8 (*)    AST 1,375 (*)    ALT 693 (*)    Total Bilirubin 2.6 (*)    GFR, Estimated 24 (*)    Anion gap 31 (*)    All other components within normal limits  CBC WITH DIFFERENTIAL/PLATELET - Abnormal; Notable for the following components:   WBC 14.3 (*)    Hemoglobin 15.1 (*)    HCT 48.3 (*)    RDW 15.7 (*)    Neutro Abs 12.2 (*)    Abs Immature Granulocytes 0.75 (*)    All other components within normal limits  PROTIME-INR - Abnormal; Notable for the following components:   Prothrombin Time 17.3 (*)    INR 1.4 (*)    All other components within normal limits  URINALYSIS, COMPLETE  (UACMP) WITH MICROSCOPIC - Abnormal; Notable for the following components:   Color, Urine AMBER (*)    APPearance HAZY (*)    Hgb urine dipstick SMALL (*)    Ketones, ur 5 (*)    Protein, ur 30 (*)    All other components within normal limits  CBG MONITORING, ED - Abnormal; Notable for the following components:   Glucose-Capillary 163 (*)    All other components within normal limits  TROPONIN I (HIGH SENSITIVITY) - Abnormal; Notable for the following components:   Troponin I (High Sensitivity) 221 (*)    All other components within normal limits  CULTURE, BLOOD (ROUTINE X 2)  CULTURE, BLOOD (ROUTINE X 2)  URINE CULTURE  APTT  MAGNESIUM  PROCALCITONIN     EKG  ED ECG REPORT I, Blake Divine, the attending physician, personally viewed and interpreted this ECG.   Date: 09/17/2022  EKG Time: 13:08  Rate: 63  Rhythm: normal sinus rhythm  Axis: Normal  Intervals: Prolonged QT  ST&T Change: None  RADIOLOGY Chest x-ray reviewed and interpreted by me with no infiltrate, edema, or effusion.  PROCEDURES:  Critical Care performed: Yes, see critical care procedure note(s)  .Critical Care  Performed by: Blake Divine, MD Authorized by: Blake Divine, MD   Critical care provider statement:    Critical care time (minutes):  30   Critical care time was exclusive of:  Separately billable procedures and treating other patients and teaching time   Critical care was necessary to treat or prevent imminent or life-threatening deterioration of the following conditions:  Sepsis and shock   Critical care was time spent personally by me on the following activities:  Development of treatment plan with patient or surrogate, discussions with consultants, evaluation of patient's response to treatment, examination of patient, ordering and review of laboratory studies, ordering and review of radiographic studies, ordering and performing treatments and interventions, pulse oximetry,  re-evaluation of patient's condition and review of old charts   I assumed direction of critical care for this patient from another provider in my specialty: no      MEDICATIONS ORDERED IN ED: Medications  iohexol (OMNIPAQUE) 300 MG/ML solution 80 mL (has no administration in time range)  vancomycin (VANCOREADY) IVPB 1250 mg/250 mL (1,250 mg Intravenous New Bag/Given 09/17/22 1534)  potassium chloride 10 mEq in 100 mL IVPB (10 mEq Intravenous New Bag/Given 09/17/22 1535)  lactated ringers bolus 1,000 mL (0 mLs Intravenous Stopped 09/17/22 1504)  lactated ringers bolus 1,000 mL (0 mLs Intravenous Stopped 09/17/22 1504)  ceFEPIme (MAXIPIME)  2 g in sodium chloride 0.9 % 100 mL IVPB (0 g Intravenous Stopped 09/17/22 1434)  metroNIDAZOLE (FLAGYL) IVPB 500 mg (0 mg Intravenous Stopped 09/17/22 1504)     IMPRESSION / MDM / ASSESSMENT AND PLAN / ED COURSE  I reviewed the triage vital signs and the nursing notes.                              86 y.o. female with past medical history of hypertension, hyperlipidemia, diabetes, atrial fibrillation, and dementia who presents to the ED with increased altered mental status and episode of unresponsiveness while being transported from her nursing facility, found to be hypotensive by EMS.  Patient's presentation is most consistent with acute presentation with potential threat to life or bodily function.  Differential diagnosis includes, but is not limited to, sepsis, cardiogenic shock, ACS, PE, pneumonia, UTI, electrolyte abnormality, AKI, anemia.  Patient ill-appearing, somnolent but arousable to voice with low blood pressure and hypothermia.  Presentation concerning for sepsis and patient started on broad-spectrum antibiotics with IV fluid bolus for septic shock.  She does not have any focal neurologic deficits and I have low suspicion for stroke, no ischemic changes noted on EKG.  Labs markable for AKI and hypokalemia, likely causing prolonged QT seen on  EKG.  Patient also with significant elevation in transaminases with elevated bilirubin but normal alkaline phosphatase.  Lactic acid markedly elevated consistent with sepsis and patient with elevated troponin consistent with presentation of shock.  Initial plan was for further assessment with CT of her head as well as abdomen/pelvis initial plan was for further assessment with CT of her head as well as abdomen/pelvis along with resuscitation for sepsis.  Son is now at bedside and states that family has been in the process of pursuing comfort and hospice care.  She had been transitioning today from [place] to [facility] with plan to engage with hospice care once she arrived there.  I explained her lab results and concern for severe illness, son expresses understanding and wishes to proceed with palliative care and hospice care at this time.  I spoke with hospice liaison at [facility], who will arrange for patient to be taken into hospice care later this afternoon or early tomorrow morning.  I also spoke with Education officer, museum at [facility], who may assist with this process.  Son does not desire admission to the hospital or further treatment of sepsis, had already had family members in town visiting the patient to say goodbye this past weekend.  Will arrange transportation for patient to arrive at [facility], where she will receive ongoing care consistent with family's goals of care.      FINAL CLINICAL IMPRESSION(S) / ED DIAGNOSES   Final diagnoses:  AKI (acute kidney injury) ([place])  Hepatitis  Encounter for hospice care     Rx / DC Orders   ED Discharge Orders     None        Note:  This document was prepared using Dragon voice recognition software and may include unintentional dictation errors.   Blake Divine, MD 09/17/22 [phone]

## 2022-09-17 NOTE — ED Notes (Signed)
EMS  CALLED  TO  TRANSPORT  PT  TO  [place]

## 2022-09-17 NOTE — ED Notes (Signed)
Patient is resting comfortably. 

## 2022-09-18 DIAGNOSIS — I10 Essential (primary) hypertension: Secondary | ICD-10-CM | POA: Diagnosis not present

## 2022-09-18 DIAGNOSIS — R062 Wheezing: Secondary | ICD-10-CM | POA: Diagnosis not present

## 2022-09-18 LAB — URINE CULTURE: Culture: NO GROWTH

## 2022-09-22 LAB — CULTURE, BLOOD (ROUTINE X 2): Culture: NO GROWTH

## 2022-10-08 DEATH — deceased
